# Patient Record
Sex: Female | Born: 1955 | Race: White | Hispanic: No | Marital: Married | Smoking: Former smoker
Health system: Southern US, Community
[De-identification: ages and names within clinical notes are randomized; demographics above are authoritative.]

## PROBLEM LIST (undated history)

## (undated) DIAGNOSIS — E119 Type 2 diabetes mellitus without complications: Secondary | ICD-10-CM

## (undated) DIAGNOSIS — K219 Gastro-esophageal reflux disease without esophagitis: Secondary | ICD-10-CM

## (undated) DIAGNOSIS — R011 Cardiac murmur, unspecified: Secondary | ICD-10-CM

## (undated) DIAGNOSIS — U071 COVID-19: Secondary | ICD-10-CM

## (undated) DIAGNOSIS — I10 Essential (primary) hypertension: Secondary | ICD-10-CM

## (undated) DIAGNOSIS — J45909 Unspecified asthma, uncomplicated: Secondary | ICD-10-CM

## (undated) DIAGNOSIS — J189 Pneumonia, unspecified organism: Secondary | ICD-10-CM

## (undated) DIAGNOSIS — E785 Hyperlipidemia, unspecified: Secondary | ICD-10-CM

## (undated) DIAGNOSIS — B009 Herpesviral infection, unspecified: Secondary | ICD-10-CM

## (undated) DIAGNOSIS — T7840XA Allergy, unspecified, initial encounter: Secondary | ICD-10-CM

## (undated) HISTORY — DX: Hyperlipidemia, unspecified: E78.5

## (undated) HISTORY — DX: Cardiac murmur, unspecified: R01.1

## (undated) HISTORY — DX: Allergy, unspecified, initial encounter: T78.40XA

## (undated) HISTORY — PX: AUGMENTATION MAMMAPLASTY: SUR837

## (undated) HISTORY — DX: Gastro-esophageal reflux disease without esophagitis: K21.9

## (undated) HISTORY — PX: CHOLECYSTECTOMY: SHX55

## (undated) HISTORY — DX: Unspecified asthma, uncomplicated: J45.909

## (undated) HISTORY — PX: BREAST EXCISIONAL BIOPSY: SUR124

## (undated) HISTORY — PX: ABDOMINAL HYSTERECTOMY: SHX81

## (undated) HISTORY — DX: Type 2 diabetes mellitus without complications: E11.9

## (undated) HISTORY — DX: COVID-19: U07.1

## (undated) HISTORY — PX: UPPER GASTROINTESTINAL ENDOSCOPY: SHX188

## (undated) HISTORY — DX: Herpesviral infection, unspecified: B00.9

## (undated) HISTORY — DX: Essential (primary) hypertension: I10

## (undated) HISTORY — PX: COLONOSCOPY: SHX174

## (undated) HISTORY — PX: BREAST SURGERY: SHX581

---

## 2016-08-06 LAB — PULMONARY FUNCTION TEST

## 2017-08-19 ENCOUNTER — Encounter: Payer: Self-pay | Admitting: Family Medicine

## 2017-08-19 ENCOUNTER — Ambulatory Visit (INDEPENDENT_AMBULATORY_CARE_PROVIDER_SITE_OTHER): Payer: BLUE CROSS/BLUE SHIELD | Admitting: Family Medicine

## 2017-08-19 VITALS — BP 130/80 | HR 83 | Temp 98.2°F | Ht 63.0 in | Wt 162.5 lb

## 2017-08-19 DIAGNOSIS — J45909 Unspecified asthma, uncomplicated: Secondary | ICD-10-CM | POA: Insufficient documentation

## 2017-08-19 DIAGNOSIS — J4541 Moderate persistent asthma with (acute) exacerbation: Secondary | ICD-10-CM | POA: Diagnosis not present

## 2017-08-19 LAB — PEAK FLOW METER: PEAK SYSTOLIC VELOCITY: 255 cm/s

## 2017-08-19 MED ORDER — DOXYCYCLINE HYCLATE 100 MG PO TABS: 100.0000 mg | ORAL_TABLET | Freq: Two times a day (BID) | ORAL | 0 refills | Status: DC

## 2017-08-19 NOTE — Progress Notes (Signed)
Subjective:  Patient ID: Alejandra Hess, female    DOB: [birth year]  Age: 62 y.o. MRN: [id number]  CC: Establish Care (blue phlegm on thursday, bad asthma, husband had staph infection from hospital, been on cipro x 3 day & 10 mg of cortizone a day.)   HPI Alejandra Hess presents for evaluation of a 4-day history of a cough productive of bluish spit phlegm.  She has a history of severe asthma that has been exacerbated by this current infection.  She is running no fever or chills.  Her husband is convalescing from a back injury and staph cellulitis.  Patient has no boils but she is concerned about a staph infection in her lungs.  She has no history of staph pneumonia.  She has been diagnosed with the acinic asthma.  She did not tolerate the maintenance drug for this medicine through her pulmonologist back in [place].  She currently is just using albuterol nebs with as needed prednisone tapers.  She quit smoking 10 years ago.  She had a CT of her chest done a year and a half ago.  She recently moved into this area 4 weeks ago.  She does have a past medical history of scarring.  She has ongoing allergy rhinitis with nasal congestion sneezing and postnasal drip.  This is been treated with Zyrtec and Nasonex.  History Alejandra Hess has no past medical history on file.   She has no past surgical history on file.   Her family history is not on file.She reports that she has quit smoking. She has never used smokeless tobacco. Her alcohol and drug histories are not on file.  Outpatient Medications Prior to Visit  Medication Sig Dispense Refill  . albuterol (PROVENTIL HFA) 108 (90 Base) MCG/ACT inhaler Inhale into the lungs.    [name] albuterol (PROVENTIL) (2.5 MG/3ML) 0.083% nebulizer solution VVN TID  7  . Ascorbic Acid (VITAMIN C PO) Take 1 tablet by mouth daily.    [name] azelastine (ASTELIN) 0.1 % nasal spray Place 2 sprays into both nostrils daily.  5  . cetirizine (ZYRTEC) 5 MG tablet Take 1 tablet by  mouth daily.    . Fluocinolone Acetonide 0.01 % OIL Place 4 drops into both ears daily as needed.    [name] MELATONIN PO Take 1 tablet by mouth daily.    . Multiple Vitamin (MULTIVITAMIN) tablet Take 1 tablet by mouth daily.    . Omega-3 Fatty Acids (FISH OIL PO) Take 1 capsule by mouth daily.    [name] omeprazole (PRILOSEC) 40 MG capsule Take 1 capsule by mouth daily as needed.    . predniSONE (DELTASONE) 5 MG tablet Take 5 mg by mouth as needed.    . raNITIdine HCl ([facility] RANITIDINE PO) Take 1 tablet by mouth daily.    [name] triamcinolone (NASACORT ALLERGY 24HR) 55 MCG/ACT AERO nasal inhaler Place 2 sprays into the nose daily.    [name] UNABLE TO FIND Med Name: CBC oil     No facility-administered medications prior to visit.     ROS Review of Systems  Constitutional: Negative for chills, fatigue, fever and unexpected weight change.  HENT: Positive for congestion, postnasal drip, rhinorrhea and sneezing. Negative for sinus pressure, sinus pain and sore throat.   Eyes: Negative for photophobia and visual disturbance.  Respiratory: Positive for cough and wheezing.   Cardiovascular: Negative.   Gastrointestinal: Negative.   Musculoskeletal: Negative for arthralgias and myalgias.  Skin: Negative for color change and pallor.  Neurological: Negative for weakness  and headaches.  Hematological: Does not bruise/bleed easily.  Psychiatric/Behavioral: Negative.     Objective:  BP 130/80   Pulse 83   Temp 98.2 F (36.8 C)   Ht 5\' 3"  (1.6 m)   Wt 162 lb 8 oz (73.7 kg)   SpO2 97%   BMI 28.79 kg/m   Physical Exam  Constitutional: She is oriented to person, place, and time. She appears well-developed and well-nourished. No distress.  HENT:  Head: Normocephalic and atraumatic.  Right Ear: External ear normal.  Left Ear: External ear normal.  Nose: Nose normal.  Mouth/Throat: Oropharynx is clear and moist. No oropharyngeal exudate.  Eyes: Pupils are equal, round, and reactive to light. Conjunctivae and EOM  are normal. Right eye exhibits no discharge. Left eye exhibits no discharge.  Neck: Normal range of motion. Neck supple. No JVD present. No tracheal deviation present. No thyromegaly present.  Cardiovascular: Normal rate and regular rhythm.  Murmur heard.  Systolic murmur is present with a grade of 1/6. Pulmonary/Chest: Effort normal. No respiratory distress. She has no wheezes. She has rhonchi in the right lower field and the left lower field. She has no rales.  Neurological: She is alert and oriented to person, place, and time.  Skin: Skin is warm and dry. Capillary refill takes less than 2 seconds. No rash noted. She is not diaphoretic. No erythema. No pallor.  Psychiatric: She has a normal mood and affect. Her behavior is normal.      Assessment & Plan:   Alejandra Hess was seen today for establish care.  Diagnoses and all orders for this visit:  Moderate persistent asthmatic bronchitis with acute exacerbation -     Peak flow meter -     Cancel: DG Chest 2 View; Future -     Discontinue: doxycycline (VIBRA-TABS) 100 MG tablet; Take 1 tablet (100 mg total) by mouth 2 (two) times daily. -     Ambulatory referral to Pulmonology -     doxycycline (VIBRA-TABS) 100 MG tablet; Take 1 tablet (100 mg total) by mouth 2 (two) times daily.   I am having Alejandra Hess maintain her albuterol, azelastine, cetirizine, multivitamin, Omega-3 Fatty Acids (FISH OIL PO), omeprazole, Fluocinolone Acetonide, albuterol, Ascorbic Acid (VITAMIN C PO), predniSONE, UNABLE TO FIND, triamcinolone, MELATONIN PO, raNITIdine HCl ([facility] RANITIDINE PO), and doxycycline.  Meds ordered this encounter  Medications  . DISCONTD: doxycycline (VIBRA-TABS) 100 MG tablet    Sig: Take 1 tablet (100 mg total) by mouth 2 (two) times daily.    Dispense:  20 tablet    Refill:  0  . doxycycline (VIBRA-TABS) 100 MG tablet    Sig: Take 1 tablet (100 mg total) by mouth 2 (two) times daily.    Dispense:  20 tablet    Refill:  0   She  will start a 6-day prednisone taper along with the prescribed doxycycline.  She will hold her Cipro.  Use her albuterol nebs as needed.  With her history of pulmonary disease I think it is important for her to establish with a pulmonologist.  Follow-up: Return in about 4 days (around 08/23/2017), or if symptoms worsen or fail to improve.  [name], MD

## 2017-12-16 ENCOUNTER — Encounter: Payer: BLUE CROSS/BLUE SHIELD | Admitting: Family Medicine

## 2017-12-18 ENCOUNTER — Encounter: Payer: BLUE CROSS/BLUE SHIELD | Admitting: Family Medicine

## 2018-01-27 ENCOUNTER — Encounter: Payer: Self-pay | Admitting: Family Medicine

## 2018-01-27 ENCOUNTER — Other Ambulatory Visit: Payer: Self-pay

## 2018-01-27 ENCOUNTER — Ambulatory Visit (INDEPENDENT_AMBULATORY_CARE_PROVIDER_SITE_OTHER): Payer: BLUE CROSS/BLUE SHIELD | Admitting: Family Medicine

## 2018-01-27 VITALS — BP 128/80 | HR 70 | Ht 63.0 in | Wt 165.2 lb

## 2018-01-27 DIAGNOSIS — Z0000 Encounter for general adult medical examination without abnormal findings: Secondary | ICD-10-CM | POA: Insufficient documentation

## 2018-01-27 LAB — COMPREHENSIVE METABOLIC PANEL
ALT: 27 U/L (ref 0–35)
AST: 17 U/L (ref 0–37)
Albumin: 4.3 g/dL (ref 3.5–5.2)
Alkaline Phosphatase: 72 U/L (ref 39–117)
BUN: 19 mg/dL (ref 6–23)
CHLORIDE: 103 meq/L (ref 96–112)
CO2: 28 mEq/L (ref 19–32)
CREATININE: 0.75 mg/dL (ref 0.40–1.20)
Calcium: 9.8 mg/dL (ref 8.4–10.5)
GFR: 82.56 mL/min (ref >60.00)
Glucose, Bld: 133 mg/dL — ABNORMAL HIGH (ref 70–99)
Potassium: 4 mEq/L (ref 3.5–5.1)
SODIUM: 139 meq/L (ref 135–145)
Total Bilirubin: 0.5 mg/dL (ref 0.2–1.2)
Total Protein: 7.3 g/dL (ref 6.0–8.3)

## 2018-01-27 LAB — CBC
HCT: 43.5 % (ref 36.0–46.0)
Hemoglobin: 14.4 g/dL (ref 12.0–15.0)
MCHC: 33 g/dL (ref 30.0–36.0)
MCV: 95 fl (ref 78.0–100.0)
PLATELETS: 272 10*3/uL (ref 150.0–400.0)
RBC: 4.58 Mil/uL (ref 3.87–5.11)
RDW: 13.6 % (ref 11.5–15.5)
WBC: 6.1 10*3/uL (ref 4.0–10.5)

## 2018-01-27 LAB — LIPID PANEL
Cholesterol: 211 mg/dL — ABNORMAL HIGH (ref 0–200)
HDL: 51 mg/dL (ref >39.00)
LDL CALC: 140 mg/dL — AB (ref 0–99)
NonHDL: 159.55
Total CHOL/HDL Ratio: 4
Triglycerides: 96 mg/dL (ref 0.0–149.0)
VLDL: 19.2 mg/dL (ref 0.0–40.0)

## 2018-01-27 LAB — URINALYSIS, ROUTINE W REFLEX MICROSCOPIC
Bilirubin Urine: NEGATIVE
Hgb urine dipstick: NEGATIVE
Ketones, ur: NEGATIVE
Leukocytes, UA: NEGATIVE
Nitrite: NEGATIVE
RBC / HPF: NONE SEEN (ref 0–?)
Specific Gravity, Urine: <=1.005 — AB (ref 1.000–1.030)
Total Protein, Urine: NEGATIVE
URINE GLUCOSE: NEGATIVE
Urobilinogen, UA: 0.2 (ref 0.0–1.0)
pH: 6 (ref 5.0–8.0)

## 2018-01-27 MED ORDER — OMEPRAZOLE 40 MG PO CPDR: 40.0000 mg | DELAYED_RELEASE_CAPSULE | Freq: Every day | ORAL | 3 refills | Status: DC | PRN

## 2018-01-27 MED ORDER — AZELASTINE HCL 0.1 % NA SOLN: 2.0000 | Freq: Every day | NASAL | 2 refills | Status: DC

## 2018-01-27 MED ORDER — TRIAMCINOLONE ACETONIDE 55 MCG/ACT NA AERO: 2.0000 | INHALATION_SPRAY | Freq: Every day | NASAL | 2 refills | Status: DC

## 2018-01-27 MED ORDER — FLUOCINOLONE ACETONIDE 0.01 % OT OIL: 4.0000 [drp] | TOPICAL_OIL | Freq: Every day | OTIC | 2 refills | Status: DC | PRN

## 2018-01-27 NOTE — Progress Notes (Addendum)
 Established Patient Office Visit  Subjective:  Patient ID: Alejandra Hess, female    DOB: 03/12/1955  Age: 62 y.o. MRN: [id number]  CC:  Chief Complaint  Patient presents with  . Annual Exam    HPI Alejandra Hess presents for a complete physical exam. She has already received her influenza vaccine.  Patient is here for physical exam.  She is fasting today.  She is retired since age 60.  Normal colonoscopy 2 years ago.  She quit tobacco 10 years ago.  She drinks 2 glasses of red wine daily.  She exercises at least 4 days a week.  She is planning on going on Medicare in June and wants to put things off as much as possible until then.  She had a hysterectomy for dysfunctional uterine bleeding at age 40.  Her sugars have been slightly elevated.  She has no history of gestational diabetes.  History of elevated LDL cholesterol.  CT cardiac scoring performed in December 2018 showed 0 calcium score.  She does have a history of asthma.  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  Her asthma symptoms for the most part have been controlled with nasal steroids and Astelin.  Past Medical History:  Diagnosis Date  . Asthma   . GERD (gastroesophageal reflux disease)   . Heart murmur     Past Surgical History:  Procedure Laterality Date  . ABDOMINAL HYSTERECTOMY    . BREAST SURGERY    . CHOLECYSTECTOMY      Family History  Problem Relation Age of Onset  . Hearing loss Mother   . Hyperlipidemia Mother   . Hypertension Mother   . Cancer Father   . Heart attack Father   . Heart disease Father   . Hyperlipidemia Father   . Hypertension Father   . Asthma Sister   . Hyperlipidemia Sister   . Hypertension Sister   . Cancer Sister   . Hyperlipidemia Sister   . Asthma Sister   . Depression Sister   . Heart attack Sister   . Heart disease Sister   . Hypertension Sister     Social History   Socioeconomic History  . Marital status: Married    Spouse name: Not on file  . Number  of children: Not on file  . Years of education: Not on file  . Highest education level: Not on file  Occupational History  . Not on file  Social Needs  . Financial resource strain: Not on file  . Food insecurity:    Worry: Not on file    Inability: Not on file  . Transportation needs:    Medical: Not on file    Non-medical: Not on file  Tobacco Use  . Smoking status: Former Smoker  . Smokeless tobacco: Never Used  Substance and Sexual Activity  . Alcohol use: Yes    Comment: 2 glasses of red wine daily  . Drug use: Never  . Sexual activity: Not on file  Lifestyle  . Physical activity:    Days per week: Not on file    Minutes per session: Not on file  . Stress: Not on file  Relationships  . Social connections:    Talks on phone: Not on file    Gets together: Not on file    Attends religious service: Not on file    Active member of club or organization: Not on file    Attends meetings of clubs or organizations: Not on file      Relationship status: Not on file  . Intimate partner violence:    Fear of current or ex partner: Not on file    Emotionally abused: Not on file    Physically abused: Not on file    Forced sexual activity: Not on file  Other Topics Concern  . Not on file  Social History Narrative  . Not on file    Outpatient Medications Prior to Visit  Medication Sig Dispense Refill  . albuterol (PROVENTIL HFA) 108 (90 Base) MCG/ACT inhaler Inhale into the lungs.    . albuterol (PROVENTIL) (2.5 MG/3ML) 0.083% nebulizer solution VVN TID  7  . Ascorbic Acid (VITAMIN C PO) Take 1 tablet by mouth daily.    . MELATONIN PO Take 1 tablet by mouth daily.    . Multiple Vitamin (MULTIVITAMIN) tablet Take 1 tablet by mouth daily.    . Omega-3 Fatty Acids (FISH OIL PO) Take 1 capsule by mouth daily.    . predniSONE (DELTASONE) 5 MG tablet Take 5 mg by mouth as needed.    . Probiotic Product (PROBIOTIC PO) Take 1 capsule by mouth daily.    . raNITIdine HCl ([facility] RANITIDINE  PO) Take 1 tablet by mouth daily.    . UNABLE TO FIND Med Name: CBC oil    . azelastine (ASTELIN) 0.1 % nasal spray Place 2 sprays into both nostrils daily. Use in each nostril as directed    . Fluocinolone Acetonide 0.01 % OIL Place 4 drops into both ears daily as needed.    . omeprazole (PRILOSEC) 40 MG capsule Take 1 capsule by mouth daily as needed.    . triamcinolone (NASACORT ALLERGY 24HR) 55 MCG/ACT AERO nasal inhaler Place 2 sprays into the nose daily.    . azelastine (ASTELIN) 0.1 % nasal spray Place 2 sprays into both nostrils daily.  5  . cetirizine (ZYRTEC) 5 MG tablet Take 1 tablet by mouth daily.    . doxycycline (VIBRA-TABS) 100 MG tablet Take 1 tablet (100 mg total) by mouth 2 (two) times daily. 20 tablet 0   No facility-administered medications prior to visit.     Allergies  Allergen Reactions  . Azithromycin Rash  . Penicillins Hives and Rash    ROS Review of Systems  Constitutional: Negative.   Eyes: Negative for photophobia and visual disturbance.  Respiratory: Negative.   Cardiovascular: Negative.   Gastrointestinal: Negative.   Endocrine: Negative for polyphagia and polyuria.  Genitourinary: Negative.   Skin: Negative for pallor.  Allergic/Immunologic: Negative for immunocompromised state.  Neurological: Negative for light-headedness and numbness.  Hematological: Does not bruise/bleed easily.  Psychiatric/Behavioral: Negative.       Objective:    Physical Exam  Constitutional: She is oriented to person, place, and time. She appears well-developed and well-nourished. No distress.  HENT:  Head: Normocephalic and atraumatic.  Right Ear: External ear normal.  Left Ear: External ear normal.  Mouth/Throat: Oropharynx is clear and moist. No oropharyngeal exudate.  Eyes: Pupils are equal, round, and reactive to light. Conjunctivae are normal. Right eye exhibits no discharge. Left eye exhibits no discharge. No scleral icterus.  Neck: Neck supple. No JVD  present. No tracheal deviation present. No thyromegaly present.  Cardiovascular: Normal rate, regular rhythm and normal heart sounds.  Pulses:      Carotid pulses are 2+ on the right side, and 2+ on the left side.      Dorsalis pedis pulses are 2+ on the right side, and 2+ on the left side.         Posterior tibial pulses are 2+ on the right side, and 2+ on the left side.  Pulmonary/Chest: Effort normal and breath sounds normal. No stridor. No respiratory distress. She has no wheezes. She has no rales.  Abdominal: Bowel sounds are normal.  Lymphadenopathy:    She has no cervical adenopathy.  Neurological: She is alert and oriented to person, place, and time.  Skin: Skin is warm and dry. She is not diaphoretic.  Psychiatric: She has a normal mood and affect. Her behavior is normal.    BP 128/80   Pulse 70   Ht 5' 3" (1.6 m)   Wt 165 lb 4 oz (75 kg)   SpO2 96%   BMI 29.27 kg/m  Wt Readings from Last 3 Encounters:  01/27/18 165 lb 4 oz (75 kg)  08/19/17 162 lb 8 oz (73.7 kg)   BP Readings from Last 3 Encounters:  01/27/18 128/80  08/19/17 130/80   There are no preventive care reminders to display for this patient.  There are no preventive care reminders to display for this patient.  No results found for: TSH No results found for: WBC, HGB, HCT, MCV, PLT No results found for: NA, K, CHLORIDE, CO2, GLUCOSE, BUN, CREATININE, BILITOT, ALKPHOS, AST, ALT, PROT, ALBUMIN, CALCIUM, ANIONGAP, EGFR, GFR No results found for: CHOL No results found for: HDL No results found for: LDLCALC No results found for: TRIG No results found for: CHOLHDL No results found for: HGBA1C    Assessment & Plan:   Problem List Items Addressed This Visit      Other   Health care maintenance - Primary   Relevant Orders   CBC   Comprehensive metabolic panel   Lipid panel   Urinalysis, Routine w reflex microscopic      No orders of the defined types were placed in this encounter.  Fasting labs  were drawn today.  Patient was given anticipatory guidance for health maintenance and disease per prevention.  She would like to see a pulmonologist and go for GYN follow-up after she goes on Medicare. Follow-up: Return in about 7 months (around 08/28/2018).    The 10-year ASCVD risk score (Goff DC Jr., et al., 2013) is: 5.4%   Values used to calculate the score:     Age: 62 years     Sex: Female     Is Non-Hispanic African American: No     Diabetic: No     Tobacco smoker: No     Systolic Blood Pressure: 128 mmHg     Is BP treated: No     HDL Cholesterol: 51 mg/dL     Total Cholesterol: 211 mg/dL 

## 2018-01-27 NOTE — Patient Instructions (Signed)

## 2018-03-03 ENCOUNTER — Encounter: Payer: Self-pay | Admitting: Family Medicine

## 2018-03-03 MED ORDER — BUDESONIDE-FORMOTEROL FUMARATE 160-4.5 MCG/ACT IN AERO: 1.0000 | INHALATION_SPRAY | Freq: Every evening | RESPIRATORY_TRACT | 3 refills | Status: DC | PRN

## 2018-05-07 ENCOUNTER — Telehealth: Payer: Self-pay | Admitting: Family Medicine

## 2018-05-07 NOTE — Telephone Encounter (Signed)
I called and left message on patient voicemail in reference to message sent to our office by patient to schedule a follow up appointment with Dr. Ethelene Hal. Due to patient not being available to schedule appointment by phone, I left message to call office and schedule appointment.

## 2018-06-26 ENCOUNTER — Encounter: Payer: Self-pay | Admitting: Family Medicine

## 2018-06-26 DIAGNOSIS — R7309 Other abnormal glucose: Secondary | ICD-10-CM

## 2018-06-26 DIAGNOSIS — E7800 Pure hypercholesterolemia, unspecified: Secondary | ICD-10-CM

## 2018-07-02 ENCOUNTER — Other Ambulatory Visit (INDEPENDENT_AMBULATORY_CARE_PROVIDER_SITE_OTHER): Payer: Medicare Other

## 2018-07-02 DIAGNOSIS — R7309 Other abnormal glucose: Secondary | ICD-10-CM | POA: Diagnosis not present

## 2018-07-02 DIAGNOSIS — E7800 Pure hypercholesterolemia, unspecified: Secondary | ICD-10-CM | POA: Diagnosis not present

## 2018-07-02 LAB — HEMOGLOBIN A1C: Hgb A1c MFr Bld: 7 % — ABNORMAL HIGH (ref 4.6–6.5)

## 2018-07-02 LAB — LDL CHOLESTEROL, DIRECT: Direct LDL: 162 mg/dL

## 2018-07-04 ENCOUNTER — Encounter: Payer: Self-pay | Admitting: Family Medicine

## 2018-07-04 ENCOUNTER — Ambulatory Visit (INDEPENDENT_AMBULATORY_CARE_PROVIDER_SITE_OTHER): Payer: Medicare Other | Admitting: Family Medicine

## 2018-07-04 VITALS — Ht 63.0 in

## 2018-07-04 DIAGNOSIS — Z0000 Encounter for general adult medical examination without abnormal findings: Secondary | ICD-10-CM | POA: Diagnosis not present

## 2018-07-04 DIAGNOSIS — J4520 Mild intermittent asthma, uncomplicated: Secondary | ICD-10-CM | POA: Diagnosis not present

## 2018-07-04 DIAGNOSIS — E119 Type 2 diabetes mellitus without complications: Secondary | ICD-10-CM | POA: Diagnosis not present

## 2018-07-04 DIAGNOSIS — E7800 Pure hypercholesterolemia, unspecified: Secondary | ICD-10-CM | POA: Diagnosis not present

## 2018-07-04 MED ORDER — METFORMIN HCL ER 500 MG PO TB24: 500.0000 mg | ORAL_TABLET | Freq: Every day | ORAL | 1 refills | Status: DC

## 2018-07-04 MED ORDER — ATORVASTATIN CALCIUM 20 MG PO TABS: 20.0000 mg | ORAL_TABLET | Freq: Every day | ORAL | 1 refills | Status: DC

## 2018-07-04 NOTE — Progress Notes (Signed)
Virtual Visit via Video Note  I connected with [name] on 07/04/18 at 10:00 AM EDT by a video enabled telemedicine application and verified that I am speaking with the correct person using two identifiers.  Location: Patient: home Provider:    Established Patient Office Visit  Subjective:  Patient ID: Alejandra Hess, female    DOB: [birth year]  Age: 63 y.o. MRN: [id number]  CC:  Chief Complaint  Patient presents with  . Follow-up    HPI Alejandra Hess presents for follow-up of her elevated blood sugar and LDL cholesterol.  Lab work recently drawn shows hemoglobin A1c of 7 and an LDL cholesterol of 162.  Patient has been exercising daily and consuming a low-fat and low-cholesterol diet.  She is retired and has been sheltering at home due to the pandemic.  History of allergy rhinitis and asthma controlled with Symbicort, cetirizine and Flonase.  She admits to feeling some fatigue and decreased energy despite consuming a healthy diet with her excellent exercise regimen.  TSH drawn in December 2018 was 1.909.  Past Medical History:  Diagnosis Date  . Asthma   . GERD (gastroesophageal reflux disease)   . Heart murmur     Past Surgical History:  Procedure Laterality Date  . ABDOMINAL HYSTERECTOMY    . BREAST SURGERY    . CHOLECYSTECTOMY      Family History  Problem Relation Age of Onset  . Hearing loss Mother   . Hyperlipidemia Mother   . Hypertension Mother   . Cancer Father   . Heart attack Father   . Heart disease Father   . Hyperlipidemia Father   . Hypertension Father   . Asthma Sister   . Hyperlipidemia Sister   . Hypertension Sister   . Cancer Sister   . Hyperlipidemia Sister   . Asthma Sister   . Depression Sister   . Heart attack Sister   . Heart disease Sister   . Hypertension Sister     Social History   Socioeconomic History  . Marital status: Married    Spouse name: Not on file  . Number of children: Not on file  . Years of education:  Not on file  . Highest education level: Not on file  Occupational History  . Not on file  Social Needs  . Financial resource strain: Not on file  . Food insecurity:    Worry: Not on file    Inability: Not on file  . Transportation needs:    Medical: Not on file    Non-medical: Not on file  Tobacco Use  . Smoking status: Former Research scientist (life sciences)  . Smokeless tobacco: Never Used  Substance and Sexual Activity  . Alcohol use: Yes    Comment: 2 glasses of red wine daily  . Drug use: Never  . Sexual activity: Not on file  Lifestyle  . Physical activity:    Days per week: Not on file    Minutes per session: Not on file  . Stress: Not on file  Relationships  . Social connections:    Talks on phone: Not on file    Gets together: Not on file    Attends religious service: Not on file    Active member of club or organization: Not on file    Attends meetings of clubs or organizations: Not on file    Relationship status: Not on file  . Intimate partner violence:    Fear of current or ex partner: Not on file    Emotionally  abused: Not on file    Physically abused: Not on file    Forced sexual activity: Not on file  Other Topics Concern  . Not on file  Social History Narrative  . Not on file    Outpatient Medications Prior to Visit  Medication Sig Dispense Refill  . albuterol (PROVENTIL HFA) 108 (90 Base) MCG/ACT inhaler Inhale into the lungs.    [name] albuterol (PROVENTIL) (2.5 MG/3ML) 0.083% nebulizer solution VVN TID  7  . azelastine (ASTELIN) 0.1 % nasal spray Place 2 sprays into both nostrils daily. Use in each nostril as directed 30 mL 2  . budesonide-formoterol (SYMBICORT) 160-4.5 MCG/ACT inhaler Inhale 1 puff into the lungs at bedtime as needed. 1 Inhaler 3  . Fluocinolone Acetonide 0.01 % OIL Place 4 drops into both ears daily as needed. 20 mL 2  . MELATONIN PO Take 1 tablet by mouth daily.    . Multiple Vitamin (MULTIVITAMIN) tablet Take 1 tablet by mouth daily.    . Omega-3 Fatty  Acids (FISH OIL PO) Take 1 capsule by mouth daily.    [name] omeprazole (PRILOSEC) 40 MG capsule Take 1 capsule (40 mg total) by mouth daily as needed. 90 capsule 3  . predniSONE (DELTASONE) 5 MG tablet Take 5 mg by mouth as needed.    . Probiotic Product (PROBIOTIC PO) Take 1 capsule by mouth daily.    [name] triamcinolone (NASACORT ALLERGY 24HR) 55 MCG/ACT AERO nasal inhaler Place 2 sprays into the nose daily. 16.9 mL 2  . UNABLE TO FIND Med Name: CBC oil    . Ascorbic Acid (VITAMIN C PO) Take 1 tablet by mouth daily.    . raNITIdine HCl ([facility] RANITIDINE PO) Take 1 tablet by mouth daily.     No facility-administered medications prior to visit.     Allergies  Allergen Reactions  . Azithromycin Rash  . Penicillins Hives and Rash    ROS Review of Systems  Constitutional: Positive for fatigue.  Respiratory: Negative.  Negative for shortness of breath and wheezing.   Cardiovascular: Negative.   Gastrointestinal: Negative.   Endocrine: Negative for polyphagia and polyuria.      Objective:    Physical Exam  Constitutional: She is oriented to person, place, and time. She appears well-developed and well-nourished. No distress.  HENT:  Head: Normocephalic and atraumatic.  Right Ear: External ear normal.  Left Ear: External ear normal.  Eyes: Right eye exhibits no discharge. Left eye exhibits no discharge. No scleral icterus.  Pulmonary/Chest: Effort normal.  Neurological: She is alert and oriented to person, place, and time.  Skin: She is not diaphoretic.  Psychiatric: She has a normal mood and affect. Her behavior is normal.    Ht 5\' 3"  (1.6 m)   BMI 29.27 kg/m  Wt Readings from Last 3 Encounters:  01/27/18 165 lb 4 oz (75 kg)  08/19/17 162 lb 8 oz (73.7 kg)     Health Maintenance Due  Topic Date Due  . FOOT EXAM  07/28/1963  . OPHTHALMOLOGY EXAM  07/28/1963    There are no preventive care reminders to display for this patient.  No results found for: TSH Lab Results   Component Value Date   WBC 6.1 01/27/2018   HGB 14.4 01/27/2018   HCT 43.5 01/27/2018   MCV 95.0 01/27/2018   PLT 272.0 01/27/2018   Lab Results  Component Value Date   NA 139 01/27/2018   K 4.0 01/27/2018   CO2 28 01/27/2018   GLUCOSE 133 (  H) 01/27/2018   BUN 19 01/27/2018   CREATININE 0.75 01/27/2018   BILITOT 0.5 01/27/2018   ALKPHOS 72 01/27/2018   AST 17 01/27/2018   ALT 27 01/27/2018   PROT 7.3 01/27/2018   ALBUMIN 4.3 01/27/2018   CALCIUM 9.8 01/27/2018   GFR 82.56 01/27/2018   Lab Results  Component Value Date   CHOL 211 (H) 01/27/2018   Lab Results  Component Value Date   HDL 51.00 01/27/2018   Lab Results  Component Value Date   LDLCALC 140 (H) 01/27/2018   Lab Results  Component Value Date   TRIG 96.0 01/27/2018   Lab Results  Component Value Date   CHOLHDL 4 01/27/2018   Lab Results  Component Value Date   HGBA1C 7.0 (H) 07/02/2018      Assessment & Plan:   Problem List Items Addressed This Visit      Endocrine   Controlled type 2 diabetes mellitus without complication, without long-term current use of insulin (HCC)   Relevant Medications   metFORMIN (GLUCOPHAGE-XR) 500 MG 24 hr tablet   atorvastatin (LIPITOR) 20 MG tablet     Other   Elevated LDL cholesterol level - Primary   Relevant Medications   atorvastatin (LIPITOR) 20 MG tablet      Meds ordered this encounter  Medications  . metFORMIN (GLUCOPHAGE-XR) 500 MG 24 hr tablet    Sig: Take 1 tablet (500 mg total) by mouth at bedtime.    Dispense:  90 tablet    Refill:  1  . atorvastatin (LIPITOR) 20 MG tablet    Sig: Take 1 tablet (20 mg total) by mouth daily.    Dispense:  90 tablet    Refill:  1   The 10-year ASCVD risk score Mikey Bussing DC Jr., et al., 2013) is: 10.1%   Values used to calculate the score:     Age: 35 years     Sex: Female     Is Non-Hispanic African American: No     Diabetic: Yes     Tobacco smoker: No     Systolic Blood Pressure: 542 mmHg     Is BP  treated: No     HDL Cholesterol: 51 mg/dL     Total Cholesterol: 211 mg/dL Follow-up: Return in about 3 months (around 10/04/2018).    [name], MD   I discussed the limitations of evaluation and management by telemedicine and the availability of in person appointments. The patient expressed understanding and agreed to proceed.  History of Present Illness:    Observations/Objective:   Assessment and Plan:   Follow Up Instructions:    I discussed the assessment and treatment plan with the patient. The patient was provided an opportunity to ask questions and all were answered. The patient agreed with the plan and demonstrated an understanding of the instructions.   The patient was advised to call back or seek an in-person evaluation if the symptoms worsen or if the condition fails to improve as anticipated.  I provided 20 minutes of non-face-to-face time during this encounter.   Encourage patient to continue her healthy lifestyle.  Hopefully diabetes treatment will increase her energy levels and decrease her sense of fatigue.  Will consider rechecking her thyroid status and other things this is not the case.  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  She used to get diabetic teaching.

## 2018-07-22 ENCOUNTER — Encounter: Payer: Self-pay | Admitting: Family Medicine

## 2018-07-24 ENCOUNTER — Encounter: Payer: Self-pay | Admitting: Family Medicine

## 2018-07-24 NOTE — Addendum Note (Signed)
Addended by: Jon Billings on: 07/24/2018 08:21 AM   Modules accepted: Orders

## 2018-08-02 ENCOUNTER — Encounter: Payer: Self-pay | Admitting: Family Medicine

## 2018-08-04 ENCOUNTER — Telehealth: Payer: Self-pay

## 2018-08-04 NOTE — Telephone Encounter (Signed)
Patient has a referral in for diabetic teaching, she is requesting an update on the referral.

## 2018-08-04 NOTE — Telephone Encounter (Signed)
See message below °

## 2018-08-16 ENCOUNTER — Encounter: Payer: Self-pay | Admitting: Family Medicine

## 2018-08-16 DIAGNOSIS — Z1231 Encounter for screening mammogram for malignant neoplasm of breast: Secondary | ICD-10-CM

## 2018-08-18 ENCOUNTER — Encounter: Payer: Self-pay | Admitting: Family Medicine

## 2018-08-18 NOTE — Addendum Note (Signed)
Addended by: Jon Billings on: 08/18/2018 08:11 AM   Modules accepted: Orders

## 2018-08-28 ENCOUNTER — Ambulatory Visit: Payer: Medicare Other | Admitting: Dietician

## 2018-10-01 ENCOUNTER — Encounter: Payer: Self-pay | Admitting: Family Medicine

## 2018-10-07 NOTE — Telephone Encounter (Signed)
I don't have any records

## 2018-10-07 NOTE — Telephone Encounter (Signed)
Patrice, have you received any records from Dr. Donna Christen office on pt?

## 2018-10-07 NOTE — Telephone Encounter (Signed)
Pt has a consult scheduled with MW tomorrow. mychart message sent by pt wanting to make sure records were received from Dt Siddiqui's office. Magda Paganini, please advise if you did receive records from this MD. Thanks!

## 2018-10-08 ENCOUNTER — Ambulatory Visit (INDEPENDENT_AMBULATORY_CARE_PROVIDER_SITE_OTHER): Payer: Medicare Other | Admitting: Internal Medicine

## 2018-10-08 ENCOUNTER — Other Ambulatory Visit: Payer: Self-pay

## 2018-10-08 ENCOUNTER — Encounter: Payer: Self-pay | Admitting: Internal Medicine

## 2018-10-08 DIAGNOSIS — J4530 Mild persistent asthma, uncomplicated: Secondary | ICD-10-CM | POA: Diagnosis not present

## 2018-10-08 NOTE — Patient Instructions (Addendum)
Plan A = Automatic = Dulera 200 (symbicort 160 )  up to 1- 2 puffs every 12 hours and taper after 7 days   Plan B = Backup Only use your albuterol inhaler as a rescue medication to be used if you can't catch your breath by resting or doing a relaxed purse lip breathing pattern.  - The less you use it, the better it will work when you need it. - Ok to use the inhaler up to 2 puffs  every 4 hours if you must but call for appointment if use goes up over your usual need - Don't leave home without it !!  (think of it like the spare tire for your car)   Plan C = Crisis - only use your albuterol nebulizer if you first try Plan B and it fails to help > ok to use the nebulizer up to every 4 hours but if start needing it regularly call for immediate appointment   Plan D = Deltasone (prednisone) Take the prednisone taper as per your [facility] pulmonologist   Plan E = ER - go to ER or call 911 if all else fails     Please schedule a follow up visit in 12 months but call sooner if needed

## 2018-10-08 NOTE — Progress Notes (Signed)
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  [place]  referred to pulmonary clinic 10/08/2018 by Alejandra   Ethelene Hess for AB eval.   Spirometry 08/06/16  FEV1  1.65/FVC  2.37 = Ratio  0.70  ? On what meds ?    History of Present Illness  10/08/2018  Pulmonary/ 1st office eval/Alejandra Hess  Chief Complaint  Patient presents with   Pulmonary Consult    Referred by Alejandra Alejandra Hess.   Dyspnea:  Plays golf, does eliptical daily x 45 min and sats are staying in mid 90s  Cough: none  Sleep: does fine flat SABA use: never uses hfa / last neb feb 2020 and prednisone  No obvious day to day or daytime variability or assoc excess/ purulent sputum or mucus plugs or hemoptysis or cp or chest tightness, subjective wheeze or overt sinus or hb symptoms.   Sleeping now  without nocturnal  or early am exacerbation  of respiratory  c/o's or need for noct saba. Also denies any obvious fluctuation of symptoms with weather or environmental changes or other aggravating or alleviating factors except as outlined above   No unusual exposure hx or h/o childhood pna/ asthma or knowledge of premature birth.  Current Allergies, Complete Past Medical History, Past Surgical History, Family History, and Social History were reviewed in [employer] record.  ROS  The following are not active complaints unless bolded Hoarseness, sore throat, dysphagia, dental problems,  itching, sneezing,  nasal congestion or discharge of excess mucus or purulent secretions, ear ache,   fever, chills, sweats, unintended wt loss or wt gain, classically pleuritic or exertional cp,  orthopnea pnd or arm/hand swelling  or leg swelling, presyncope, palpitations, abdominal pain, anorexia, nausea, vomiting, diarrhea  or change in bowel habits or change in bladder habits, change in stools or change in urine, dysuria, hematuria,  rash, arthralgias, visual complaints, headache, numbness, weakness or ataxia or problems with walking or coordination,  change in mood or  memory.          Past Medical History:  Diagnosis Date   Asthma    GERD (gastroesophageal reflux disease)    Heart murmur     Outpatient Medications Prior to Visit  Medication Sig Dispense Refill   albuterol (PROVENTIL HFA) 108 (90 Base) MCG/ACT inhaler Inhale into the lungs.     albuterol (PROVENTIL) (2.5 MG/3ML) 0.083% nebulizer solution VVN TID  7   azelastine (ASTELIN) 0.1 % nasal spray Place 2 sprays into both nostrils daily. Use in each nostril as directed 30 mL 2   budesonide-formoterol (SYMBICORT) 160-4.5 MCG/ACT inhaler Inhale 1 puff into the lungs at bedtime as needed. 1 Inhaler 3   cetirizine (ZYRTEC) 10 MG tablet Take 10 mg by mouth daily.     Fluocinolone Acetonide 0.01 % OIL Place 4 drops into both ears  daily as needed. 20 mL 2   fluticasone (FLONASE) 50 MCG/ACT nasal spray Place 2 sprays into both nostrils daily.     magnesium oxide (MAG-OX) 400 MG tablet Take 400 mg by mouth daily.     MELATONIN PO Take 1 tablet by mouth daily.     metFORMIN (GLUCOPHAGE-XR) 500 MG 24 hr tablet Take 1 tablet (500 mg total) by mouth at bedtime. 90 tablet 1   Multiple Vitamin (MULTIVITAMIN) tablet Take 1 tablet by mouth daily.     omeprazole (PRILOSEC) 40 MG capsule Take 1 capsule (40 mg total) by mouth daily as needed. 90 capsule 3   predniSONE (DELTASONE) 5 MG tablet Take 5 mg by mouth as needed.      Probiotic Product (PROBIOTIC PO) Take 1 capsule by mouth daily.     atorvastatin (LIPITOR) 20 MG tablet Take 1 tablet (20 mg total) by mouth daily. 90 tablet 1   Omega-3 Fatty Acids (FISH OIL PO) Take 1 capsule by mouth daily.     UNABLE TO FIND Med Name: CBC oil     triamcinolone (NASACORT ALLERGY 24HR) 55 MCG/ACT AERO nasal inhaler Place 2 sprays into the nose daily. 16.9 mL 2      Objective:     BP 130/86 (BP Location: Left Arm, Cuff Size: Normal)    Pulse 74    Ht 5' 4.75" (1.645 m)    Wt 162 lb 6.4 oz (73.7 kg)    SpO2 96%    BMI 27.23 kg/m   SpO2: 96 %  RA  amb wf nad   HEENT: nl dentition, turbinates bilaterally, and oropharynx. Nl external ear canals without cough reflex   NECK :  without JVD/Nodes/TM/ nl carotid upstrokes bilaterally   LUNGS: no acc muscle use,  Nl contour chest which is clear to A and P bilaterally without cough on insp or exp maneuvers   CV:  RRR  no s3 or murmur or increase in P2, and no edema   ABD:  soft and nontender with nl inspiratory excursion in the supine position. No bruits or organomegaly appreciated, bowel sounds nl  MS:  Nl gait/ ext warm without deformities, calf tenderness, cyanosis or clubbing No obvious joint restrictions   SKIN: warm and dry without lesions    NEURO:  alert, approp, nl sensorium with  no motor or cerebellar deficits apparent.     I personally reviewed images and agree with radiology impression as follows:   Chest CT s contrast  08/06/16 nl  CT cardiac lung  views  02/22/27 nl     Assessment   Asthmatic bronchitis Quit smoking 2010  - Spirometry 08/06/16  FEV1  1.65/FVC  2.37 = Ratio  0.70  ? On what meds ?  - 10/08/2018  After extensive coaching inhaler device,  effectiveness =    90% so ok to use symb 160 or dulera 200 up to 2 q 12 h   She has elements suggestive of both copd and asthma typical of ACOS for which there are no standard guidelines but her condition is relatively mild and had done well  most the year on symb 160 one daily so rec either symb 160 up to 2 bid or dulera 200 Based on two studies from [place]  378; 20 p 1865 (2018) and 380 : p2020-30 (2019) in pts with mild asthma it is reasonable to use symbicort or dulera  "prn" flare in this setting but I emphasized this was only shown with symbicort (and  by extrapolation probably applies to duelra if there is a formular restriction)  and takes advantage of the rapid onset of action but is not the same as "rescue therapy" but can be stopped once the acute symptoms have resolved and the need for rescue has been minimized (< 2 x weekly)     Advised:  formulary restrictions will be an ongoing challenge for the forseable future and I would be happy to pick an alternative if the pt will first  provide me a list of them -  pt  will need to return here for training for any new device that is required eg dpi vs hfa vs respimat.    In the meantime we can always provide samples so that the patient never runs out of any needed respiratory medications.    F/u can be q 12 m, sooner prn     Total time devoted to counseling  > 50 % of initial 60 min office visit:  reviewed case with pt/  performed device teaching  using a teach back technique which also  extended face to face time for this visit (see above)  discussion of options/alternatives/ personally creating written customized instructions  in presence of pt  then going over those specific  Instructions directly with the pt including how to use all of the meds but in particular covering each new medication in detail and the difference between the maintenance= "automatic" meds and the prns using an action plan format for the latter (If this problem/symptom => do that organization reading Left to right).  Please see AVS from this visit for a full list of these instructions which I personally wrote for this pt and  are unique to this visit.      Christinia Gully, MD 10/08/2018

## 2018-10-08 NOTE — Assessment & Plan Note (Signed)
Quit smoking 2010  - Spirometry 08/06/16  FEV1  1.65/FVC  2.37 = Ratio  0.70  ? On what meds ?  - 10/08/2018  After extensive coaching inhaler device,  effectiveness =    90% so ok to use symb 160 or dulera 200 up to 2 q 12 h   She has elements suggestive of both copd and asthma typical of ACOS for which there are no standard guidelines but her condition is relatively mild and had done well most the year on symb 160 one daily so rec either symb 160 up to 2 bid or dulera 200 Based on two studies from [place]  378; 20 p 1865 (2018) and 380 : p2020-30 (2019) in pts with mild asthma it is reasonable to use symbicort or dulera  "prn" flare in this setting but I emphasized this was only shown with symbicort (and by extrapolation probably applies to duelra if there is a formular restriction)  and takes advantage of the rapid onset of action but is not the same as "rescue therapy" but can be stopped once the acute symptoms have resolved and the need for rescue has been minimized (< 2 x weekly)     Advised:  formulary restrictions will be an ongoing challenge for the forseable future and I would be happy to pick an alternative if the pt will first  provide me a list of them -  pt  will need to return here for training for any new device that is required eg dpi vs hfa vs respimat.    In the meantime we can always provide samples so that the patient never runs out of any needed respiratory medications.    F/u can be q 12 m, sooner prn     Total time devoted to counseling  > 50 % of initial 60 min office visit:  reviewed case with pt/  performed device teaching  using a teach back technique which also  extended face to face time for this visit (see above)  discussion of options/alternatives/ personally creating written customized instructions  in presence of pt  then going over those specific  Instructions directly with the pt including how to use all of the meds but in particular covering each new medication in  detail and the difference between the maintenance= "automatic" meds and the prns using an action plan format for the latter (If this problem/symptom => do that organization reading Left to right).  Please see AVS from this visit for a full list of these instructions which I personally wrote for this pt and  are unique to this visit.

## 2018-10-10 ENCOUNTER — Encounter: Payer: Self-pay | Admitting: Family Medicine

## 2018-10-10 ENCOUNTER — Other Ambulatory Visit: Payer: Self-pay

## 2018-10-10 DIAGNOSIS — K219 Gastro-esophageal reflux disease without esophagitis: Secondary | ICD-10-CM

## 2018-10-10 DIAGNOSIS — Z83511 Family history of glaucoma: Secondary | ICD-10-CM

## 2018-10-10 DIAGNOSIS — M79674 Pain in right toe(s): Secondary | ICD-10-CM

## 2018-10-13 ENCOUNTER — Encounter: Payer: Self-pay | Admitting: Gastroenterology

## 2018-10-21 ENCOUNTER — Ambulatory Visit (INDEPENDENT_AMBULATORY_CARE_PROVIDER_SITE_OTHER): Payer: Medicare Other | Admitting: Family Medicine

## 2018-10-21 ENCOUNTER — Encounter: Payer: Self-pay | Admitting: Family Medicine

## 2018-10-21 ENCOUNTER — Other Ambulatory Visit: Payer: Self-pay

## 2018-10-21 VITALS — BP 120/80 | HR 66 | Ht 64.75 in | Wt 162.0 lb

## 2018-10-21 DIAGNOSIS — E119 Type 2 diabetes mellitus without complications: Secondary | ICD-10-CM | POA: Diagnosis not present

## 2018-10-21 DIAGNOSIS — Z0000 Encounter for general adult medical examination without abnormal findings: Secondary | ICD-10-CM

## 2018-10-21 DIAGNOSIS — Z23 Encounter for immunization: Secondary | ICD-10-CM | POA: Diagnosis not present

## 2018-10-21 DIAGNOSIS — E7800 Pure hypercholesterolemia, unspecified: Secondary | ICD-10-CM

## 2018-10-21 NOTE — Patient Instructions (Signed)
Health Maintenance After Age 65 After age 39, you are at a higher risk for certain long-term diseases and infections as well as injuries from falls. Falls are a major cause of broken bones and head injuries in people who are older than age 65. Getting regular preventive care can help to keep you healthy and well. Preventive care includes getting regular testing and making lifestyle changes as recommended by your health care provider. Talk with your health care provider about:  Which screenings and tests you should have. A screening is a test that checks for a disease when you have no symptoms.  A diet and exercise plan that is right for you. What should I know about screenings and tests to prevent falls? Screening and testing are the best ways to find a health problem early. Early diagnosis and treatment give you the best chance of managing medical conditions that are common after age 65. Certain conditions and lifestyle choices may make you more likely to have a fall. Your health care provider may recommend:  Regular vision checks. Poor vision and conditions such as cataracts can make you more likely to have a fall. If you wear glasses, make sure to get your prescription updated if your vision changes.  Medicine review. Work with your health care provider to regularly review all of the medicines you are taking, including over-the-counter medicines. Ask your health care provider about any side effects that may make you more likely to have a fall. Tell your health care provider if any medicines that you take make you feel dizzy or sleepy.  Osteoporosis screening. Osteoporosis is a condition that causes the bones to get weaker. This can make the bones weak and cause them to break more easily.  Blood pressure screening. Blood pressure changes and medicines to control blood pressure can make you feel dizzy.  Strength and balance checks. Your health care provider may recommend certain tests to check your  strength and balance while standing, walking, or changing positions.  Foot health exam. Foot pain and numbness, as well as not wearing proper footwear, can make you more likely to have a fall.  Depression screening. You may be more likely to have a fall if you have a fear of falling, feel emotionally low, or feel unable to do activities that you used to do.  Alcohol use screening. Using too much alcohol can affect your balance and may make you more likely to have a fall. What actions can I take to lower my risk of falls? General instructions  Talk with your health care provider about your risks for falling. Tell your health care provider if: ? You fall. Be sure to tell your health care provider about all falls, even ones that seem minor. ? You feel dizzy, sleepy, or off-balance.  Take over-the-counter and prescription medicines only as told by your health care provider. These include any supplements.  Eat a healthy diet and maintain a healthy weight. A healthy diet includes low-fat dairy products, low-fat (lean) meats, and fiber from whole grains, beans, and lots of fruits and vegetables. Home safety  Remove any tripping hazards, such as rugs, cords, and clutter.  Install safety equipment such as grab bars in bathrooms and safety rails on stairs.  Keep rooms and walkways well-lit. Activity   Follow a regular exercise program to stay fit. This will help you maintain your balance. Ask your health care provider what types of exercise are appropriate for you.  If you need a cane or  walker, use it as recommended by your health care provider.  Wear supportive shoes that have nonskid soles. Lifestyle  Do not drink alcohol if your health care provider tells you not to drink.  If you drink alcohol, limit how much you have: ? 0-1 drink a day for women. ? 0-2 drinks a day for men.  Be aware of how much alcohol is in your drink. In the [place] one drink equals one typical bottle of beer (12  oz), one-half glass of wine (5 oz), or one shot of hard liquor (1 oz).  Do not use any products that contain nicotine or tobacco, such as cigarettes and e-cigarettes. If you need help quitting, ask your health care provider. Summary  Having a healthy lifestyle and getting preventive care can help to protect your health and wellness after age 1.  Screening and testing are the best way to find a health problem early and help you avoid having a fall. Early diagnosis and treatment give you the best chance for managing medical conditions that are more common for people who are older than age 65.  Falls are a major cause of broken bones and head injuries in people who are older than age 65. Take precautions to prevent a fall at home.  Work with your health care provider to learn what changes you can make to improve your health and wellness and to prevent falls. This information is not intended to replace advice given to you by your health care provider. Make sure you discuss any questions you have with your health care provider. Document Released: 12/26/2016 Document Revised: 06/05/2018 Document Reviewed: 12/26/2016 Elsevier Patient Education  2020 [place] 65 Years and Older, Female Preventive care refers to lifestyle choices and visits with your health care provider that can promote health and wellness. This includes:  A yearly physical exam. This is also called an annual well check.  Regular dental and eye exams.  Immunizations.  Screening for certain conditions.  Healthy lifestyle choices, such as diet and exercise. What can I expect for my preventive care visit? Physical exam Your health care provider will check:  Height and weight. These may be used to calculate body mass index (BMI), which is a measurement that tells if you are at a healthy weight.  Heart rate and blood pressure.  Your skin for abnormal spots. Counseling Your health care provider may  ask you questions about:  Alcohol, tobacco, and drug use.  Emotional well-being.  Home and relationship well-being.  Sexual activity.  Eating habits.  History of falls.  Memory and ability to understand (cognition).  Work and work Statistician.  Pregnancy and menstrual history. What immunizations do I need?  Influenza (flu) vaccine  This is recommended every year. Tetanus, diphtheria, and pertussis (Tdap) vaccine  You may need a Td booster every 10 years. Varicella (chickenpox) vaccine  You may need this vaccine if you have not already been vaccinated. Zoster (shingles) vaccine  You may need this after age 50. Pneumococcal conjugate (PCV13) vaccine  One dose is recommended after age 48. Pneumococcal polysaccharide (PPSV23) vaccine  One dose is recommended after age 1. Measles, mumps, and rubella (MMR) vaccine  You may need at least one dose of MMR if you were born in 1957 or later. You may also need a second dose. Meningococcal conjugate (MenACWY) vaccine  You may need this if you have certain conditions. Hepatitis A vaccine  You may need this if you have certain conditions or  if you travel or work in places where you may be exposed to hepatitis A. Hepatitis B vaccine  You may need this if you have certain conditions or if you travel or work in places where you may be exposed to hepatitis B. Haemophilus influenzae type b (Hib) vaccine  You may need this if you have certain conditions. You may receive vaccines as individual doses or as more than one vaccine together in one shot (combination vaccines). Talk with your health care provider about the risks and benefits of combination vaccines. What tests do I need? Blood tests  Lipid and cholesterol levels. These may be checked every 5 years, or more frequently depending on your overall health.  Hepatitis C test.  Hepatitis B test. Screening  Lung cancer screening. You may have this screening every year  starting at age 69 if you have a 30-pack-year history of smoking and currently smoke or have quit within the past 15 years.  Colorectal cancer screening. All adults should have this screening starting at age 66 and continuing until age 10. Your health care provider may recommend screening at age 67 if you are at increased risk. You will have tests every 1-10 years, depending on your results and the type of screening test.  Diabetes screening. This is done by checking your blood sugar (glucose) after you have not eaten for a while (fasting). You may have this done every 1-3 years.  Mammogram. This may be done every 1-2 years. Talk with your health care provider about how often you should have regular mammograms.  BRCA-related cancer screening. This may be done if you have a family history of breast, ovarian, tubal, or peritoneal cancers. Other tests  Sexually transmitted disease (STD) testing.  Bone density scan. This is done to screen for osteoporosis. You may have this done starting at age 20. Follow these instructions at home: Eating and drinking  Eat a diet that includes fresh fruits and vegetables, whole grains, lean protein, and low-fat dairy products. Limit your intake of foods with high amounts of sugar, saturated fats, and salt.  Take vitamin and mineral supplements as recommended by your health care provider.  Do not drink alcohol if your health care provider tells you not to drink.  If you drink alcohol: ? Limit how much you have to 0-1 drink a day. ? Be aware of how much alcohol is in your drink. In the [place] one drink equals one 12 oz bottle of beer (355 mL), one 5 oz glass of wine (148 mL), or one 1 oz glass of hard liquor (44 mL). Lifestyle  Take daily care of your teeth and gums.  Stay active. Exercise for at least 30 minutes on 5 or more days each week.  Do not use any products that contain nicotine or tobacco, such as cigarettes, e-cigarettes, and chewing tobacco. If  you need help quitting, ask your health care provider.  If you are sexually active, practice safe sex. Use a condom or other form of protection in order to prevent STIs (sexually transmitted infections).  Talk with your health care provider about taking a low-dose aspirin or statin. What's next?  Go to your health care provider once a year for a well check visit.  Ask your health care provider how often you should have your eyes and teeth checked.  Stay up to date on all vaccines. This information is not intended to replace advice given to you by your health care provider. Make sure you discuss any  questions you have with your health care provider. Document Released: 03/11/2015 Document Revised: 02/06/2018 Document Reviewed: 02/06/2018 Elsevier Patient Education  2020 [employer].

## 2018-10-21 NOTE — Progress Notes (Addendum)
Established Patient Office Visit  Subjective:  Patient ID: Alejandra Hess, female    DOB: [birth year]  Age: 63 y.o. MRN: [id number]  CC:  Chief Complaint  Patient presents with  . Annual Exam    HPI Alejandra Hess presents for a physical exam and follow-up of her diabetes and elevated cholesterol.  She is nonfasting today.  She has been taking the Glucophage without issue.  She is assumed a diabetic diet and has been exercising most every day for at least 30 minutes.  She was unable to tolerate the Lipitor because she felt as though led to conjunctivitis.  She does not smoke.  She drinks 2 glasses of red wine daily.  Unable to go for dental care this year.  Needs an eye check.  Is scheduled to see her GYN doctor next week.  Also scheduled for mammogram next week.  Past Medical History:  Diagnosis Date  . Asthma   . DM (diabetes mellitus) ([place])   . GERD (gastroesophageal reflux disease)   . Heart murmur   . HLD (hyperlipidemia)     Past Surgical History:  Procedure Laterality Date  . ABDOMINAL HYSTERECTOMY    . AUGMENTATION MAMMAPLASTY Bilateral    @ 43 lift with implants  . BREAST EXCISIONAL BIOPSY Right    @ 53  . BREAST EXCISIONAL BIOPSY Left    @43 ?  . CHOLECYSTECTOMY     Family History  Problem Relation Age of Onset  . Hearing loss Mother   . Hyperlipidemia Mother   . Hypertension Mother   . Diabetes Mother   . Heart attack Father   . Heart disease Father   . Hyperlipidemia Father   . Hypertension Father   . Melanoma Father   . Asthma Sister   . Hyperlipidemia Sister   . Hypertension Sister   . Hyperlipidemia Sister   . Breast cancer Sister 12  . Asthma Sister   . Depression Sister   . Heart attack Sister   . Heart disease Sister   . Hypertension Sister   . Diabetes Sister   . Breast cancer Maternal Aunt        over 5  . Breast cancer Paternal Aunt        over 39  . Breast cancer Paternal Grandmother        over 79   . Breast cancer Maternal Aunt         over 59   . Colon cancer Neg Hx   . Esophageal cancer Neg Hx   . Inflammatory bowel disease Neg Hx   . Liver disease Neg Hx   . Pancreatic cancer Neg Hx   . Rectal cancer Neg Hx   . Stomach cancer Neg Hx     Social History   Socioeconomic History  . Marital status: Married    Spouse name: Not on file  . Number of children: 2  . Years of education: Not on file  . Highest education level: Not on file  Occupational History  . Occupation: Therapist, sports  Social Needs  . Financial resource strain: Not on file  . Food insecurity    Worry: Not on file    Inability: Not on file  . Transportation needs    Medical: Not on file    Non-medical: Not on file  Tobacco Use  . Smoking status: Former Smoker    Types: Cigarettes    Quit date: 2013    Years since quitting: 7.7  . Smokeless tobacco: Never Used  Substance and Sexual Activity  . Alcohol use: Yes    Comment: 2 glasses of red wine daily  . Drug use: Never  . Sexual activity: Yes    Partners: Male    Comment: 1st intercourse- 76, partners- 5-, MARRIED- 89  Lifestyle  . Physical activity    Days per week: Not on file    Minutes per session: Not on file  . Stress: Not on file  Relationships  . Social Herbalist on phone: Not on file    Gets together: Not on file    Attends religious service: Not on file    Active member of club or organization: Not on file    Attends meetings of clubs or organizations: Not on file    Relationship status: Not on file  . Intimate partner violence    Fear of current or ex partner: Not on file    Emotionally abused: Not on file    Physically abused: Not on file    Forced sexual activity: Not on file  Other Topics Concern  . Not on file  Social History Narrative  . Not on file    Outpatient Medications Prior to Visit  Medication Sig Dispense Refill  . albuterol (PROVENTIL HFA) 108 (90 Base) MCG/ACT inhaler Inhale into the lungs.    [name] albuterol (PROVENTIL) (2.5 MG/3ML) 0.083%  nebulizer solution VVN TID  7  . azelastine (ASTELIN) 0.1 % nasal spray Place 2 sprays into both nostrils daily. Use in each nostril as directed 30 mL 2  . budesonide-formoterol (SYMBICORT) 160-4.5 MCG/ACT inhaler Inhale 1 puff into the lungs at bedtime as needed. 1 Inhaler 3  . cetirizine (ZYRTEC) 10 MG tablet Take 10 mg by mouth daily.    . Fluocinolone Acetonide 0.01 % OIL Place 4 drops into both ears daily as needed. 20 mL 2  . fluticasone (FLONASE) 50 MCG/ACT nasal spray Place 2 sprays into both nostrils daily.    . magnesium oxide (MAG-OX) 400 MG tablet Take 400 mg by mouth 2 (two) times daily.     [name] MELATONIN PO Take 5 mg by mouth 2 (two) times daily.     . Multiple Vitamin (MULTIVITAMIN) tablet Take 1 tablet by mouth daily.    . predniSONE (DELTASONE) 5 MG tablet Take 5 mg by mouth as needed.    . Probiotic Product (PROBIOTIC PO) Take 1 capsule by mouth 2 (two) times daily.     . metFORMIN (GLUCOPHAGE-XR) 500 MG 24 hr tablet Take 1 tablet (500 mg total) by mouth at bedtime. 90 tablet 1  . omeprazole (PRILOSEC) 40 MG capsule Take 1 capsule (40 mg total) by mouth daily as needed. 90 capsule 3  . atorvastatin (LIPITOR) 20 MG tablet Take 1 tablet (20 mg total) by mouth daily. 90 tablet 1  . Omega-3 Fatty Acids (FISH OIL PO) Take 1 capsule by mouth daily.    [name] UNABLE TO FIND Med Name: CBC oil     No facility-administered medications prior to visit.     Allergies  Allergen Reactions  . Azithromycin Rash  . Penicillins Hives and Rash    ROS Review of Systems  Constitutional: Negative for diaphoresis, fatigue, fever and unexpected weight change.  HENT: Negative.   Eyes: Negative for photophobia and visual disturbance.  Respiratory: Negative.   Cardiovascular: Negative.   Gastrointestinal: Negative.   Endocrine: Negative for polyphagia and polyuria.  Genitourinary: Negative.   Musculoskeletal: Negative for gait problem and joint swelling.  Skin:  Negative for pallor and rash.   Allergic/Immunologic: Negative for immunocompromised state.  Neurological: Negative for speech difficulty and light-headedness.  Hematological: Does not bruise/bleed easily.  Psychiatric/Behavioral: Negative.       Objective:    Physical Exam  Constitutional: She is oriented to person, place, and time. She appears well-developed and well-nourished. No distress.  HENT:  Head: Normocephalic and atraumatic.  Right Ear: External ear normal.  Left Ear: External ear normal.  Mouth/Throat: Oropharynx is clear and moist. No oropharyngeal exudate.  Eyes: Pupils are equal, round, and reactive to light. Conjunctivae are normal. Right eye exhibits no discharge. Left eye exhibits no discharge. No scleral icterus.  Neck: Neck supple. No JVD present. No tracheal deviation present. No thyromegaly present.  Cardiovascular: Normal rate, regular rhythm and normal heart sounds.  Pulses:      Dorsalis pedis pulses are 2+ on the right side and 2+ on the left side.       Posterior tibial pulses are 1+ on the right side and 1+ on the left side.  Pulmonary/Chest: Effort normal and breath sounds normal. No stridor. No respiratory distress. She has no wheezes. She has no rales.  Abdominal: Bowel sounds are normal.  Musculoskeletal:        General: No edema.  Lymphadenopathy:    She has no cervical adenopathy.  Neurological: She is alert and oriented to person, place, and time.  Skin: Skin is warm and dry. She is not diaphoretic.  Psychiatric: She has a normal mood and affect. Her behavior is normal.   Diabetic Foot Exam - Simple   Simple Foot Form Diabetic Foot exam was performed with the following findings: Yes 10/21/2018  9:48 AM  Visual Inspection See comments: Yes Sensation Testing Intact to touch and monofilament testing bilaterally: Yes Pulse Check Posterior Tibialis and Dorsalis pulse intact bilaterally: Yes Comments Feet are cavus.      BP 120/80   Pulse 66   Ht 5' 4.75" (1.645 m)   Wt  162 lb (73.5 kg)   SpO2 98%   BMI 27.17 kg/m  Wt Readings from Last 3 Encounters:  11/18/18 160 lb 4 oz (72.7 kg)  10/24/18 153 lb (69.4 kg)  10/21/18 162 lb (73.5 kg)   BP Readings from Last 3 Encounters:  11/18/18 136/78  10/24/18 130/84  10/21/18 120/80   Guideline developer:  UpToDate (see UpToDate for funding source) Date Released: June 2014  There are no preventive care reminders to display for this patient.  There are no preventive care reminders to display for this patient.  No results found for: TSH Lab Results  Component Value Date   WBC 5.9 10/22/2018   HGB 13.7 10/22/2018   HCT 42.0 10/22/2018   MCV 94.3 10/22/2018   PLT 269.0 10/22/2018   Lab Results  Component Value Date   NA 141 10/22/2018   K 4.2 10/22/2018   CO2 27 10/22/2018   GLUCOSE 139 (H) 10/22/2018   BUN 21 10/22/2018   CREATININE 0.69 10/22/2018   BILITOT 0.5 10/22/2018   ALKPHOS 63 10/22/2018   AST 18 10/22/2018   ALT 29 10/22/2018   PROT 7.1 10/22/2018   ALBUMIN 4.4 10/22/2018   CALCIUM 9.6 10/22/2018   GFR 85.32 10/22/2018   Lab Results  Component Value Date   CHOL 214 (H) 10/22/2018   Lab Results  Component Value Date   HDL 48.60 10/22/2018   Lab Results  Component Value Date   LDLCALC 147 (H) 10/22/2018   Lab Results  Component Value Date   TRIG 91.0 10/22/2018   Lab Results  Component Value Date   CHOLHDL 4 10/22/2018   Lab Results  Component Value Date   HGBA1C 6.4 10/22/2018   The 10-year ASCVD risk score Mikey Bussing DC Jr., et al., 2013) is: 12.8%   Values used to calculate the score:     Age: 54 years     Sex: Female     Is Non-Hispanic African American: No     Diabetic: Yes     Tobacco smoker: No     Systolic Blood Pressure: [id number] mmHg     Is BP treated: No     HDL Cholesterol: 48.6 mg/dL     Total Cholesterol: 214 mg/dL   Assessment & Plan:   Problem List Items Addressed This Visit      Endocrine   Controlled type 2 diabetes mellitus without  complication, without long-term current use of insulin ([facility])   Relevant Medications   metFORMIN (GLUCOPHAGE) 500 MG tablet   Other Relevant Orders   CBC (Completed)   Comprehensive metabolic panel (Completed)   Hemoglobin A1c (Completed)   Microalbumin / creatinine urine ratio (Completed)   Urinalysis, Routine w reflex microscopic (Completed)     Other   Elevated LDL cholesterol level - Primary   Relevant Orders   Lipid panel (Completed)    Other Visit Diagnoses    Healthcare maintenance       Relevant Orders   Ambulatory referral to Ophthalmology   Ambulatory referral to Dermatology   Need for influenza vaccination       Relevant Orders   Flu Vaccine QUAD High Dose(Fluad) (Completed)   Need for 23-polyvalent pneumococcal polysaccharide vaccine       Relevant Orders   Pneumococcal polysaccharide vaccine 23-valent greater than or equal to 2yo subcutaneous/IM (Completed)      Meds ordered this encounter  Medications  . DISCONTD: simvastatin (ZOCOR) 20 MG tablet    Sig: Take 1 tablet (20 mg total) by mouth at bedtime.    Dispense:  30 tablet    Refill:  3  . metFORMIN (GLUCOPHAGE) 500 MG tablet    Sig: Take 1 tablet (500 mg total) by mouth 2 (two) times daily with a meal.    Dispense:  180 tablet    Refill:  0    Follow-up: Return in about 3 months (around 01/21/2019).   She will go ahead and seek dental care.  Restart statin pending results of today's lipid panel.  Follow-up in 3 months.  She was given information on health maintenance and disease prevention.

## 2018-10-22 ENCOUNTER — Other Ambulatory Visit (INDEPENDENT_AMBULATORY_CARE_PROVIDER_SITE_OTHER): Payer: Medicare Other

## 2018-10-22 ENCOUNTER — Encounter: Payer: Medicare Other | Admitting: Family Medicine

## 2018-10-22 DIAGNOSIS — E119 Type 2 diabetes mellitus without complications: Secondary | ICD-10-CM | POA: Diagnosis not present

## 2018-10-22 DIAGNOSIS — E7800 Pure hypercholesterolemia, unspecified: Secondary | ICD-10-CM | POA: Diagnosis not present

## 2018-10-22 LAB — CBC
HCT: 42 % (ref 36.0–46.0)
Hemoglobin: 13.7 g/dL (ref 12.0–15.0)
MCHC: 32.6 g/dL (ref 30.0–36.0)
MCV: 94.3 fl (ref 78.0–100.0)
Platelets: 269 10*3/uL (ref 150.0–400.0)
RBC: 4.46 Mil/uL (ref 3.87–5.11)
RDW: 13.4 % (ref 11.5–15.5)
WBC: 5.9 10*3/uL (ref 4.0–10.5)

## 2018-10-22 LAB — URINALYSIS, ROUTINE W REFLEX MICROSCOPIC
Bilirubin Urine: NEGATIVE
Hgb urine dipstick: NEGATIVE
Ketones, ur: NEGATIVE
Leukocytes,Ua: NEGATIVE
Nitrite: NEGATIVE
RBC / HPF: NONE SEEN (ref 0–?)
Specific Gravity, Urine: 1.015 (ref 1.000–1.030)
Total Protein, Urine: NEGATIVE
Urine Glucose: NEGATIVE
Urobilinogen, UA: 0.2 (ref 0.0–1.0)
pH: 7 (ref 5.0–8.0)

## 2018-10-22 LAB — COMPREHENSIVE METABOLIC PANEL
ALT: 29 U/L (ref 0–35)
AST: 18 U/L (ref 0–37)
Albumin: 4.4 g/dL (ref 3.5–5.2)
Alkaline Phosphatase: 63 U/L (ref 39–117)
BUN: 21 mg/dL (ref 6–23)
CO2: 27 mEq/L (ref 19–32)
Calcium: 9.6 mg/dL (ref 8.4–10.5)
Chloride: 105 mEq/L (ref 96–112)
Creatinine, Ser: 0.69 mg/dL (ref 0.40–1.20)
GFR: 85.32 mL/min (ref >60.00)
Glucose, Bld: 139 mg/dL — ABNORMAL HIGH (ref 70–99)
Potassium: 4.2 mEq/L (ref 3.5–5.1)
Sodium: 141 mEq/L (ref 135–145)
Total Bilirubin: 0.5 mg/dL (ref 0.2–1.2)
Total Protein: 7.1 g/dL (ref 6.0–8.3)

## 2018-10-22 LAB — MICROALBUMIN / CREATININE URINE RATIO
Creatinine,U: 103.7 mg/dL
Microalb Creat Ratio: 0.7 mg/g (ref 0.0–30.0)
Microalb, Ur: <0.7 mg/dL (ref 0.0–1.9)

## 2018-10-22 LAB — LIPID PANEL
Cholesterol: 214 mg/dL — ABNORMAL HIGH (ref 0–200)
HDL: 48.6 mg/dL (ref >39.00)
LDL Cholesterol: 147 mg/dL — ABNORMAL HIGH (ref 0–99)
NonHDL: 165.31
Total CHOL/HDL Ratio: 4
Triglycerides: 91 mg/dL (ref 0.0–149.0)
VLDL: 18.2 mg/dL (ref 0.0–40.0)

## 2018-10-22 LAB — HEMOGLOBIN A1C: Hgb A1c MFr Bld: 6.4 % (ref 4.6–6.5)

## 2018-10-23 ENCOUNTER — Other Ambulatory Visit: Payer: Self-pay

## 2018-10-23 ENCOUNTER — Ambulatory Visit
Admission: RE | Admit: 2018-10-23 | Discharge: 2018-10-23 | Disposition: A | Discharge status: Home / Self Care | Payer: Medicare Other | Source: Ambulatory Visit | Attending: Family Medicine | Admitting: Family Medicine

## 2018-10-23 DIAGNOSIS — Z1231 Encounter for screening mammogram for malignant neoplasm of breast: Secondary | ICD-10-CM

## 2018-10-23 MED ORDER — SIMVASTATIN 20 MG PO TABS: 20.0000 mg | ORAL_TABLET | Freq: Every day | ORAL | 3 refills | Status: DC

## 2018-10-23 NOTE — Addendum Note (Signed)
Addended by: Abelino Derrick A on: 10/23/2018 11:02 AM   Modules accepted: Orders

## 2018-10-24 ENCOUNTER — Ambulatory Visit (INDEPENDENT_AMBULATORY_CARE_PROVIDER_SITE_OTHER): Payer: Medicare Other | Admitting: Obstetrics & Gynecology

## 2018-10-24 ENCOUNTER — Encounter: Payer: Self-pay | Admitting: Obstetrics & Gynecology

## 2018-10-24 VITALS — BP 130/84 | Ht 63.25 in | Wt 153.0 lb

## 2018-10-24 DIAGNOSIS — Z9189 Other specified personal risk factors, not elsewhere classified: Secondary | ICD-10-CM

## 2018-10-24 DIAGNOSIS — Z13820 Encounter for screening for osteoporosis: Secondary | ICD-10-CM

## 2018-10-24 DIAGNOSIS — Z01419 Encounter for gynecological examination (general) (routine) without abnormal findings: Secondary | ICD-10-CM

## 2018-10-24 DIAGNOSIS — Z1272 Encounter for screening for malignant neoplasm of vagina: Secondary | ICD-10-CM | POA: Diagnosis not present

## 2018-10-24 DIAGNOSIS — Z780 Asymptomatic menopausal state: Secondary | ICD-10-CM

## 2018-10-24 DIAGNOSIS — Z90710 Acquired absence of both cervix and uterus: Secondary | ICD-10-CM

## 2018-10-24 NOTE — Progress Notes (Signed)
[facility] Oct 22, 1955 [id number]   History:    63 y.o. G4P2A2L2 married.  Moved from [place] to be closer to the grandchildren.  RP:  New patient presenting for annual gyn exam   HPI: Status post TAH.  Menopause, well on no hormone replacement therapy.  No postmenopausal bleeding.  No pelvic pain.  No pain with intercourse.  Urine and bowel movements normal.  Breast normal.  Body mass index 26.89.  Good fitness and healthy nutrition.  Health labs with Dr. Alfonso Ramus.  Last colonoscopy 3 years ago  Past medical history,surgical history, family history and social history were all reviewed and documented in the EPIC chart.  Gynecologic History No LMP recorded. Patient has had a hysterectomy. Contraception: status post hysterectomy Last Pap: 2 yrs ago, normal per patient Last mammogram: 09/2018. Results were: Negative Bone Density: >2 yrs Colonoscopy: 3 yrs ago  Obstetric History OB History  Gravida Para Term Preterm AB Living  4 2     2 2   SAB TAB Ectopic Multiple Live Births  2            # Outcome Date GA Lbr Len/2nd Weight Sex Delivery Anes PTL Lv  4 SAB           3 SAB           2 Para           1 Para              ROS: A ROS was performed and pertinent positives and negatives are included in the history.  GENERAL: No fevers or chills. HEENT: No change in vision, no earache, sore throat or sinus congestion. NECK: No pain or stiffness. CARDIOVASCULAR: No chest pain or pressure. No palpitations. PULMONARY: No shortness of breath, cough or wheeze. GASTROINTESTINAL: No abdominal pain, nausea, vomiting or diarrhea, melena or bright red blood per rectum. GENITOURINARY: No urinary frequency, urgency, hesitancy or dysuria. MUSCULOSKELETAL: No joint or muscle pain, no back pain, no recent trauma. DERMATOLOGIC: No rash, no itching, no lesions. ENDOCRINE: No polyuria, polydipsia, no heat or cold intolerance. No recent change in weight. HEMATOLOGICAL: No anemia or easy bruising or  bleeding. NEUROLOGIC: No headache, seizures, numbness, tingling or weakness. PSYCHIATRIC: No depression, no loss of interest in normal activity or change in sleep pattern.     Exam:   BP 130/84    Ht 5' 3.25" (1.607 m)    Wt 153 lb (69.4 kg)    BMI 26.89 kg/m   Body mass index is 26.89 kg/m.  General appearance : Well developed well nourished female. No acute distress HEENT: Eyes: no retinal hemorrhage or exudates,  Neck supple, trachea midline, no carotid bruits, no thyroidmegaly Lungs: Clear to auscultation, no rhonchi or wheezes, or rib retractions  Heart: Regular rate and rhythm, no murmurs or gallops Breast:Examined in sitting and supine position were symmetrical in appearance, no palpable masses or tenderness,  no skin retraction, no nipple inversion, no nipple discharge, no skin discoloration, no axillary or supraclavicular lymphadenopathy Abdomen: no palpable masses or tenderness, no rebound or guarding Extremities: no edema or skin discoloration or tenderness  Pelvic: Vulva: Normal             Vagina: No gross lesions or discharge.  Pap reflex done.  Cervix/Uterus absent  Adnexa  Without masses or tenderness  Anus: Normal   Assessment/Plan:  63 y.o. female for annual exam   1. Encounter for Papanicolaou smear of vagina as part of  routine gynecological examination Gynecologic exam status post TAH and menopause.  Pap reflex done on the vaginal vault.  Breast exam normal.  Screening mammogram August 2020 was negative.  Colonoscopy 3 years ago.  Health labs with Dr. Alfonso Ramus.  Body mass index 26.89.  Continue with fitness and healthy nutrition.  2. S/P total hysterectomy  3. Postmenopause Well on no hormone replacement therapy.  4. Screening for osteoporosis Schedule bone density here now.  Vitamin D supplements, calcium intake of 1200 mg daily and regular weightbearing physical activity recommended. - DG Bone Density; Future  Princess Bruins MD, 11:15 AM 10/24/2018

## 2018-10-24 NOTE — Patient Instructions (Signed)
1. Encounter for Papanicolaou smear of vagina as part of routine gynecological examination Gynecologic exam status post TAH and menopause.  Pap reflex done on the vaginal vault.  Breast exam normal.  Screening mammogram August 2020 was negative.  Colonoscopy 3 years ago.  Health labs with Dr. Alfonso Ramus.  Body mass index 26.89.  Continue with fitness and healthy nutrition.  2. S/P total hysterectomy  3. Postmenopause Well on no hormone replacement therapy.  4. Screening for osteoporosis Schedule bone density here now.  Vitamin D supplements, calcium intake of 1200 mg daily and regular weightbearing physical activity recommended. - DG Bone Density; Future  Arlisha, it was a pleasure seeing you today!  I will inform you of your results as soon as they are available.

## 2018-10-27 LAB — PAP IG W/ RFLX HPV ASCU

## 2018-10-29 ENCOUNTER — Other Ambulatory Visit: Payer: Self-pay

## 2018-10-30 ENCOUNTER — Ambulatory Visit (INDEPENDENT_AMBULATORY_CARE_PROVIDER_SITE_OTHER): Payer: Medicare Other

## 2018-10-30 ENCOUNTER — Other Ambulatory Visit: Payer: Self-pay | Admitting: Obstetrics & Gynecology

## 2018-10-30 DIAGNOSIS — M810 Age-related osteoporosis without current pathological fracture: Secondary | ICD-10-CM | POA: Diagnosis not present

## 2018-10-30 DIAGNOSIS — Z780 Asymptomatic menopausal state: Secondary | ICD-10-CM

## 2018-10-30 DIAGNOSIS — Z13820 Encounter for screening for osteoporosis: Secondary | ICD-10-CM

## 2018-11-05 DIAGNOSIS — H6120 Impacted cerumen, unspecified ear: Secondary | ICD-10-CM | POA: Diagnosis not present

## 2018-11-17 DIAGNOSIS — E119 Type 2 diabetes mellitus without complications: Secondary | ICD-10-CM | POA: Diagnosis not present

## 2018-11-17 DIAGNOSIS — H40023 Open angle with borderline findings, high risk, bilateral: Secondary | ICD-10-CM | POA: Diagnosis not present

## 2018-11-18 ENCOUNTER — Other Ambulatory Visit: Payer: Self-pay

## 2018-11-18 ENCOUNTER — Encounter: Payer: Self-pay | Admitting: Gastroenterology

## 2018-11-18 ENCOUNTER — Ambulatory Visit (INDEPENDENT_AMBULATORY_CARE_PROVIDER_SITE_OTHER): Payer: Medicare Other | Admitting: Gastroenterology

## 2018-11-18 VITALS — BP 136/78 | HR 60 | Temp 98.0°F | Ht 63.5 in | Wt 160.2 lb

## 2018-11-18 DIAGNOSIS — Z8719 Personal history of other diseases of the digestive system: Secondary | ICD-10-CM | POA: Diagnosis not present

## 2018-11-18 DIAGNOSIS — K219 Gastro-esophageal reflux disease without esophagitis: Secondary | ICD-10-CM | POA: Diagnosis not present

## 2018-11-18 MED ORDER — SUCRALFATE 1 GM/10ML PO SUSP: 1.0000 g | Freq: Three times a day (TID) | ORAL | 1 refills | Status: DC

## 2018-11-18 MED ORDER — OMEPRAZOLE 40 MG PO CPDR: 40.0000 mg | DELAYED_RELEASE_CAPSULE | Freq: Every day | ORAL | 3 refills | Status: DC | PRN

## 2018-11-18 NOTE — Patient Instructions (Addendum)
You have been scheduled for an endoscopy. Please follow written instructions given to you at your visit today. If you use inhalers (even only as needed), please bring them with you on the day of your procedure.   We have sent the following medications to your pharmacy for you to pick up at your convenience: Omeprazole, Carafate   Thank you for choosing me and [facility].  Dr. Rush Landmark

## 2018-11-18 NOTE — Progress Notes (Addendum)
[place] VISIT   Primary Care Provider [name], MD [place] [address] [phone]  Referring Provider [name], MD [address] [place],  [place] [address] [phone]  Patient Profile: Alejandra Hess is a 63 y.o. female with a pmh significant for asthma, diabetes, hyperlipidemia, GERD, status post cholecystectomy, status post hysterectomy.  The patient presents to the [facility] for an evaluation and management of problem(s) noted below:  Problem List 1. Gastroesophageal reflux disease, esophagitis presence not specified   2. H/O esophagitis     History of Present Illness This is the patient's first visit to the outpatient [place] clinic.  The patient just moved to [place] to be closer to her daughter and [place].  She has for the last 10 years been on over-the-counter medications for GERD.  In 2017 the patient reportedly underwent an upper endoscopy where she was found to have "inflammation".  At that time she was started on Prilosec and then Dexilant and then Nexium and then back on omeprazole.  At one point in time she was taking Nexium twice daily.  At some point in the last 2 to 3 years her GI provider in [place] started her on domperidone for consideration of whether she may have functional dyspepsia.  She has been taking domperidone for the last 2 years.  She has had a nocturnal cough but has attributed this to her asthma and allergies.  The patient states that she has no true dysphagia.  However the acid reflux is persisting.  She never underwent a follow-up endoscopy to evaluate for healing of her "inflammation of the esophagus".  She is not clear if she ever had biopsies to rule out EOE or lymphocytic esophagitis.  The patient has a history of rectal bleeding and has had a hemorrhoidectomy previously.  If she takes magnesium pills on a daily  basis as well as continues a high-fiber diet she does not have constipation.  She had a history of prior vaginal deliveries of her children.  When she had her cholecystectomy performed years ago she reports having just a sick gallbladder but was not told that she had stones.  There is no family history of GI malignancies.  Patient has had a colonoscopy reportedly in 2017 we do not have access to those records.  She is a retired Marine scientist.  Patient does not take significant nonsteroidals.  She has never been on Carafate.  GI Review of Systems Positive as above Negative for odynophagia, globus, nausea, vomiting, early satiety, bloating, change in bowel habits, melena, hematochezia  Review of Systems General: Denies fevers/chills HEENT: Denies oral lesions Cardiovascular: Denies chest pain/palpitations Pulmonary: Denies shortness of breath Gastroenterological: See HPI Genitourinary: Denies darkened urine or hematuria Hematological: Denies easy bruising/bleeding Endocrine: Denies temperature intolerance Dermatological: Denies jaundice Psychological: Mood is stable   Medications Current Outpatient Medications  Medication Sig Dispense Refill  . albuterol (PROVENTIL HFA) 108 (90 Base) MCG/ACT inhaler Inhale into the lungs.    [name] albuterol (PROVENTIL) (2.5 MG/3ML) 0.083% nebulizer solution VVN TID  7  . azelastine (ASTELIN) 0.1 % nasal spray Place 2 sprays into both nostrils daily. Use in each nostril as directed 30 mL 2  . budesonide-formoterol (SYMBICORT) 160-4.5 MCG/ACT inhaler Inhale 1 puff into the lungs at bedtime as needed. 1 Inhaler 3  . cetirizine (ZYRTEC) 10 MG tablet Take 10 mg by mouth daily.    . Fluocinolone Acetonide 0.01 % OIL Place  4 drops into both ears daily as needed. 20 mL 2  . fluticasone (FLONASE) 50 MCG/ACT nasal spray Place 2 sprays into both nostrils daily.    . magnesium oxide (MAG-OX) 400 MG tablet Take 400 mg by mouth 2 (two) times daily.     [name] MELATONIN PO Take 5 mg by  mouth 2 (two) times daily.     . metFORMIN (GLUCOPHAGE-XR) 500 MG 24 hr tablet Take 1 tablet (500 mg total) by mouth at bedtime. 90 tablet 1  . Multiple Vitamin (MULTIVITAMIN) tablet Take 1 tablet by mouth daily.    [name] omeprazole (PRILOSEC) 40 MG capsule Take 1 capsule (40 mg total) by mouth daily as needed. 90 capsule 3  . predniSONE (DELTASONE) 5 MG tablet Take 5 mg by mouth as needed.    . Probiotic Product (PROBIOTIC PO) Take 1 capsule by mouth 2 (two) times daily.     . simvastatin (ZOCOR) 20 MG tablet Take 1 tablet (20 mg total) by mouth at bedtime. 30 tablet 3  . sucralfate (CARAFATE) 1 GM/10ML suspension Take 10 mLs (1 g total) by mouth 4 (four) times daily -  with meals and at bedtime. 420 mL 1   No current facility-administered medications for this visit.     Allergies Allergies  Allergen Reactions  . Azithromycin Rash  . Penicillins Hives and Rash    Histories Past Medical History:  Diagnosis Date  . Asthma   . DM (diabetes mellitus) ([place])   . GERD (gastroesophageal reflux disease)   . Heart murmur   . HLD (hyperlipidemia)    Past Surgical History:  Procedure Laterality Date  . ABDOMINAL HYSTERECTOMY    . AUGMENTATION MAMMAPLASTY Bilateral    @ 43 lift with implants  . BREAST EXCISIONAL BIOPSY Right    @ 53  . BREAST EXCISIONAL BIOPSY Left    @43 ?  . CHOLECYSTECTOMY     Social History   Socioeconomic History  . Marital status: Married    Spouse name: Not on file  . Number of children: 2  . Years of education: Not on file  . Highest education level: Not on file  Occupational History  . Occupation: Therapist, sports  Social Needs  . Financial resource strain: Not on file  . Food insecurity    Worry: Not on file    Inability: Not on file  . Transportation needs    Medical: Not on file    Non-medical: Not on file  Tobacco Use  . Smoking status: Former Smoker    Types: Cigarettes    Quit date: 2013    Years since quitting: 7.7  . Smokeless tobacco: Never Used   Substance and Sexual Activity  . Alcohol use: Yes    Comment: 2 glasses of red wine daily  . Drug use: Never  . Sexual activity: Yes    Partners: Male    Comment: 1st intercourse- 89, partners- 5-, MARRIED- 25  Lifestyle  . Physical activity    Days per week: Not on file    Minutes per session: Not on file  . Stress: Not on file  Relationships  . Social Herbalist on phone: Not on file    Gets together: Not on file    Attends religious service: Not on file    Active member of club or organization: Not on file    Attends meetings of clubs or organizations: Not on file    Relationship status: Not on file  . Intimate partner  violence    Fear of current or ex partner: Not on file    Emotionally abused: Not on file    Physically abused: Not on file    Forced sexual activity: Not on file  Other Topics Concern  . Not on file  Social History Narrative  . Not on file   Family History  Problem Relation Age of Onset  . Hearing loss Mother   . Hyperlipidemia Mother   . Hypertension Mother   . Diabetes Mother   . Heart attack Father   . Heart disease Father   . Hyperlipidemia Father   . Hypertension Father   . Melanoma Father   . Asthma Sister   . Hyperlipidemia Sister   . Hypertension Sister   . Hyperlipidemia Sister   . Breast cancer Sister 4  . Asthma Sister   . Depression Sister   . Heart attack Sister   . Heart disease Sister   . Hypertension Sister   . Diabetes Sister   . Breast cancer Maternal Aunt        over 42  . Breast cancer Paternal Aunt        over 83  . Breast cancer Paternal Grandmother        over 31   . Breast cancer Maternal Aunt        over 33   . Colon cancer Neg Hx   . Esophageal cancer Neg Hx   . Inflammatory bowel disease Neg Hx   . Liver disease Neg Hx   . Pancreatic cancer Neg Hx   . Rectal cancer Neg Hx   . Stomach cancer Neg Hx    I have reviewed her medical, social, and family history in detail and updated the electronic  medical record as necessary.    PHYSICAL EXAMINATION  BP 136/78 (BP Location: Left Arm, Patient Position: Sitting, Cuff Size: Normal)   Pulse 60   Temp 98 F (36.7 C)   Ht 5' 3.5" (1.613 m) Comment: height measured without shoes  Wt 160 lb 4 oz (72.7 kg)   BMI 27.94 kg/m  Wt Readings from Last 3 Encounters:  11/18/18 160 lb 4 oz (72.7 kg)  10/24/18 153 lb (69.4 kg)  10/21/18 162 lb (73.5 kg)  GEN: NAD, appears stated age, doesn't appear chronically ill PSYCH: Cooperative, without pressured speech EYE: Conjunctivae pink, sclerae anicteric ENT: MMM, without oral ulcers, no erythema or exudates noted NECK: Supple CV: RR without R/Gs  RESP: CTAB posteriorly, without wheezing GI: NABS, soft, NT/ND, without rebound or guarding, no HSM appreciated MSK/EXT: No lower extremity edema SKIN: No jaundice NEURO:  Alert & Oriented x 3, no focal deficits   REVIEW OF DATA  I reviewed the following data at the time of this encounter:  GI Procedures and Studies  Reportedly has had upper endoscopy and colonoscopy will have to work on trying to obtain those records  Laboratory Studies  Reviewed those in epic  Imaging Studies  2014 care everywhere ultrasound right upper quadrant Findings: Visualized portions of the pancreas, abdominal aorta, and inferior vena cava appear normal, the liver measures 14.4 cm, with normal directional flow seen within the portal vein. Overall echogenicity appears normal. The gallbladder shows no gallstones nor gallbladder wall thickening nor sonographic Murphy's sign. The common duct is normal at 4 mm, the right kidney measures 9.4 cm. Impression: Unremarkable limited abdomen ultrasound.   ASSESSMENT  Ms. Guider is a 63 y.o. female with a pmh significant for asthma, diabetes, hyperlipidemia,  GERD, status post cholecystectomy, status post hysterectomy.  The patient is seen today for evaluation and management of:  1. Gastroesophageal reflux disease,  esophagitis presence not specified   2. H/O esophagitis    Patient is hemodynamically stable.  She presents with a longstanding history of what seems to be GERD with prior discussion of possibly having esophagitis.  She has never been checked for healing even while she is been on multiple PPI therapies.  I wonder if she could have a complex of some bile reflux gastritis as well.  We are going to continue her acid reducing PPI at once daily dosing for now but may consider increasing.  I also wonder about how she may tolerate the use of Carafate.  I would like to initiate this and see if she can tolerate that and she will try to take it at least 2 times per day but try to increase up to 4 times per day with each meal.  She understands that she cannot take a medication between 1 hour before 1 hour after ingestion of Carafate.  The patient will benefit from a repeat diagnostic upper endoscopy to evaluate the upper GI tract and see if she has any evidence of eosinophilic esophagitis or lymphocytic esophagitis and/or persistent esophagitis.  We will rule out that she does not have a hiatal hernia either.  We briefly discussed the role of nonmedical therapies for GERD including surgical fundoplication as well as potentially endoscopic fundoplication or other therapy such as the [place].  The patient is not desiring of a surgical intervention at this point but would like to just get a better sense of things.  We will plan to see how she does with Carafate and proceed with an upper endoscopy in a few weeks.  The risks and benefits of endoscopic evaluation were discussed with the patient; these include but are not limited to the risk of perforation, infection, bleeding, missed lesions, lack of diagnosis, severe illness requiring hospitalization, as well as anesthesia and sedation related illnesses.  The patient is agreeable to proceed.  All patient questions were answered, to the best of my ability, and the patient agrees to  the aforementioned plan of action with follow-up as indicated.   PLAN  Continue omeprazole 40 mg daily (take 30 minutes before meal) Begin Carafate 10 mils 4 times daily with meals and at bedtime (do not take medications 1 hour before 1 hour after ingestion of Carafate) Obtain out side EGD/colonoscopy report from prior gastroenterologist Diagnostic endoscopy (esophageal/gastric biopsies to be obtained) Further testing/evaluation such as pH impedance testing will be considered in the future based on findings on endoscopy and patient's symptoms Lifestyle modifications -Eat meals >3 hours before bedtime -Do not lay down or lie flat immediately after eating for at least 2-hours -Do not overeat (decrease portion size and/or eat more frequent smaller meals) -Eat slowly -Wear Loose-fitting clothes -Raise Head of Bed (Head & Chest > Feet with bed blocks) -Avoid foods that trigger the symptoms (Onions, Chocolate, Caffeine, Spicy, and Fatty) -Work on Losing Weight -Increase Exercise Activity -Maintain Heartburn Diary/Log   Orders Placed This Encounter  Procedures  . Ambulatory referral to Gastroenterology    New Prescriptions   SUCRALFATE (CARAFATE) 1 GM/10ML SUSPENSION    Take 10 mLs (1 g total) by mouth 4 (four) times daily -  with meals and at bedtime.   Modified Medications   Modified Medication Previous Medication   OMEPRAZOLE (PRILOSEC) 40 MG CAPSULE omeprazole (PRILOSEC) 40 MG capsule  Take 1 capsule (40 mg total) by mouth daily as needed.    Take 1 capsule (40 mg total) by mouth daily as needed.    Planned Follow Up No follow-ups on file.   Justice Britain, MD [facility] # [phone]

## 2018-11-20 ENCOUNTER — Encounter: Payer: Self-pay | Admitting: Gastroenterology

## 2018-11-21 DIAGNOSIS — K219 Gastro-esophageal reflux disease without esophagitis: Secondary | ICD-10-CM | POA: Insufficient documentation

## 2018-11-21 DIAGNOSIS — Z8719 Personal history of other diseases of the digestive system: Secondary | ICD-10-CM | POA: Insufficient documentation

## 2018-11-24 ENCOUNTER — Encounter: Payer: Self-pay | Admitting: Family Medicine

## 2018-11-24 DIAGNOSIS — H409 Unspecified glaucoma: Secondary | ICD-10-CM | POA: Insufficient documentation

## 2018-11-29 ENCOUNTER — Encounter: Payer: Self-pay | Admitting: Family Medicine

## 2018-11-29 DIAGNOSIS — Z0000 Encounter for general adult medical examination without abnormal findings: Secondary | ICD-10-CM

## 2018-11-30 ENCOUNTER — Other Ambulatory Visit: Payer: Self-pay | Admitting: Family Medicine

## 2018-11-30 DIAGNOSIS — E7800 Pure hypercholesterolemia, unspecified: Secondary | ICD-10-CM

## 2018-11-30 DIAGNOSIS — E119 Type 2 diabetes mellitus without complications: Secondary | ICD-10-CM

## 2018-12-02 ENCOUNTER — Encounter: Payer: Self-pay | Admitting: Family Medicine

## 2018-12-02 MED ORDER — METFORMIN HCL 500 MG PO TABS: 500.0000 mg | ORAL_TABLET | Freq: Two times a day (BID) | ORAL | 0 refills | Status: DC

## 2018-12-02 NOTE — Addendum Note (Signed)
Addended by: Abelino Derrick A on: 12/02/2018 11:06 AM   Modules accepted: Orders

## 2018-12-07 ENCOUNTER — Encounter: Payer: Self-pay | Admitting: Gastroenterology

## 2018-12-07 NOTE — Progress Notes (Unsigned)
Review of outside records to be scanned in the chart  2017 colonoscopy Sessile polyp between 3 and 5 mm in size found at the appendiceal orifice removed with cold forceps.  A sessile polyps ranging between 3 to 5 mm found in the sigmoid colon.  Removed with cold forceps.  2017 endoscopy At least grade B esophagitis was present. Stomach was normal. Duodenum was normal.  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  Sigmoid polyp was hyperplastic.  Based on the timing of this last procedure her follow-up would have been a 5-year colonoscopy thus we will wait for 2022 for her colonoscopy unless she has other symptoms develop from a lower perspective.  Justice Britain, MD [facility] # [phone]

## 2018-12-08 ENCOUNTER — Ambulatory Visit (INDEPENDENT_AMBULATORY_CARE_PROVIDER_SITE_OTHER): Payer: Medicare Other | Admitting: Family Medicine

## 2018-12-08 ENCOUNTER — Encounter: Payer: Self-pay | Admitting: Family Medicine

## 2018-12-08 ENCOUNTER — Other Ambulatory Visit: Payer: Self-pay

## 2018-12-08 DIAGNOSIS — B349 Viral infection, unspecified: Secondary | ICD-10-CM | POA: Insufficient documentation

## 2018-12-08 DIAGNOSIS — J4521 Mild intermittent asthma with (acute) exacerbation: Secondary | ICD-10-CM | POA: Diagnosis not present

## 2018-12-08 DIAGNOSIS — R509 Fever, unspecified: Secondary | ICD-10-CM | POA: Diagnosis not present

## 2018-12-08 DIAGNOSIS — U071 COVID-19: Secondary | ICD-10-CM | POA: Diagnosis not present

## 2018-12-08 DIAGNOSIS — R519 Headache, unspecified: Secondary | ICD-10-CM | POA: Diagnosis not present

## 2018-12-08 DIAGNOSIS — J45909 Unspecified asthma, uncomplicated: Secondary | ICD-10-CM | POA: Insufficient documentation

## 2018-12-08 MED ORDER — PREDNISONE 20 MG PO TABS: 20.0000 mg | ORAL_TABLET | Freq: Two times a day (BID) | ORAL | 0 refills | Status: AC

## 2018-12-08 NOTE — Progress Notes (Signed)
Established Patient Office Visit  Subjective:  Patient ID: Alejandra Hess, female    DOB: 11-12-1955  Age: 63 y.o. MRN: [id number]  CC:  Chief Complaint  Patient presents with  . covid symptoms    HPI Alejandra Hess presents for evaluation treatment of a 3-day history of elevated temperatures up to 100, chills fatigue headache stuffy nose drainage sinus pain cough with increased wheezing.  There has been some heaviness in her chest.  Her O2 sats have been running at 96 which is normal for her.  She has a history of asthma the distant history of tobacco use.  She denies difficulties with urination arthralgias or myalgias.  She went to urgent care this morning and was tested for Covid.  Examining doctor had noted rhonchi at her bases.  Denies increased rescue inhaler use.  She was instructed by her pulmonologist to increase her Symbicort with a respiratory tract illness.  Patient denies any aberrations in her senses of taste or smell.  She has been taking Advil sinus that has increased her blood pressure.  She has since discontinued it.  Patient lives with her husband.  There is no one else who lives in the home with them.  Patient's daughter is available to pick up prescriptions and other needed things. Past Medical History:  Diagnosis Date  . Asthma   . DM (diabetes mellitus) ([place])   . GERD (gastroesophageal reflux disease)   . Heart murmur   . HLD (hyperlipidemia)     Past Surgical History:  Procedure Laterality Date  . ABDOMINAL HYSTERECTOMY    . AUGMENTATION MAMMAPLASTY Bilateral    @ 43 lift with implants  . BREAST EXCISIONAL BIOPSY Right    @ 53  . BREAST EXCISIONAL BIOPSY Left    @43 ?  . CHOLECYSTECTOMY      Family History  Problem Relation Age of Onset  . Hearing loss Mother   . Hyperlipidemia Mother   . Hypertension Mother   . Diabetes Mother   . Heart attack Father   . Heart disease Father   . Hyperlipidemia Father   . Hypertension Father   . Melanoma Father    . Asthma Sister   . Hyperlipidemia Sister   . Hypertension Sister   . Hyperlipidemia Sister   . Breast cancer Sister 30  . Asthma Sister   . Depression Sister   . Heart attack Sister   . Heart disease Sister   . Hypertension Sister   . Diabetes Sister   . Breast cancer Maternal Aunt        over 76  . Breast cancer Paternal Aunt        over 63  . Breast cancer Paternal Grandmother        over 82   . Breast cancer Maternal Aunt        over 19   . Colon cancer Neg Hx   . Esophageal cancer Neg Hx   . Inflammatory bowel disease Neg Hx   . Liver disease Neg Hx   . Pancreatic cancer Neg Hx   . Rectal cancer Neg Hx   . Stomach cancer Neg Hx     Social History   Socioeconomic History  . Marital status: Married    Spouse name: Not on file  . Number of children: 2  . Years of education: Not on file  . Highest education level: Not on file  Occupational History  . Occupation: Therapist, sports  Social Needs  . Financial resource strain: Not  on file  . Food insecurity    Worry: Not on file    Inability: Not on file  . Transportation needs    Medical: Not on file    Non-medical: Not on file  Tobacco Use  . Smoking status: Former Smoker    Types: Cigarettes    Quit date: 2013    Years since quitting: 7.7  . Smokeless tobacco: Never Used  Substance and Sexual Activity  . Alcohol use: Yes    Comment: 2 glasses of red wine daily  . Drug use: Never  . Sexual activity: Yes    Partners: Male    Comment: 1st intercourse- 54, partners- 5-, MARRIED- 2  Lifestyle  . Physical activity    Days per week: Not on file    Minutes per session: Not on file  . Stress: Not on file  Relationships  . Social Herbalist on phone: Not on file    Gets together: Not on file    Attends religious service: Not on file    Active member of club or organization: Not on file    Attends meetings of clubs or organizations: Not on file    Relationship status: Not on file  . Intimate partner violence     Fear of current or ex partner: Not on file    Emotionally abused: Not on file    Physically abused: Not on file    Forced sexual activity: Not on file  Other Topics Concern  . Not on file  Social History Narrative  . Not on file    Outpatient Medications Prior to Visit  Medication Sig Dispense Refill  . albuterol (PROVENTIL HFA) 108 (90 Base) MCG/ACT inhaler Inhale into the lungs.    [name] albuterol (PROVENTIL) (2.5 MG/3ML) 0.083% nebulizer solution VVN TID  7  . azelastine (ASTELIN) 0.1 % nasal spray Place 2 sprays into both nostrils daily. Use in each nostril as directed 30 mL 2  . budesonide-formoterol (SYMBICORT) 160-4.5 MCG/ACT inhaler Inhale 1 puff into the lungs at bedtime as needed. 1 Inhaler 3  . cetirizine (ZYRTEC) 10 MG tablet Take 10 mg by mouth daily.    . Fluocinolone Acetonide 0.01 % OIL Place 4 drops into both ears daily as needed. 20 mL 2  . fluticasone (FLONASE) 50 MCG/ACT nasal spray Place 2 sprays into both nostrils daily.    . magnesium oxide (MAG-OX) 400 MG tablet Take 400 mg by mouth 2 (two) times daily.     [name] MELATONIN PO Take 5 mg by mouth 2 (two) times daily.     . metFORMIN (GLUCOPHAGE) 500 MG tablet Take 1 tablet (500 mg total) by mouth 2 (two) times daily with a meal. 180 tablet 0  . Multiple Vitamin (MULTIVITAMIN) tablet Take 1 tablet by mouth daily.    [name] omeprazole (PRILOSEC) 40 MG capsule Take 1 capsule (40 mg total) by mouth daily as needed. 90 capsule 3  . predniSONE (DELTASONE) 5 MG tablet Take 5 mg by mouth as needed.    . Probiotic Product (PROBIOTIC PO) Take 1 capsule by mouth 2 (two) times daily.     . simvastatin (ZOCOR) 20 MG tablet TAKE ONE TABLET BY MOUTH AT BEDTIME 90 tablet 1  . sucralfate (CARAFATE) 1 GM/10ML suspension Take 10 mLs (1 g total) by mouth 4 (four) times daily -  with meals and at bedtime. 420 mL 1   No facility-administered medications prior to visit.     Allergies  Allergen  Reactions  . Azithromycin Rash  . Penicillins  Hives and Rash    ROS Review of Systems  Constitutional: Positive for diaphoresis and fever. Negative for chills, fatigue and unexpected weight change.  HENT: Positive for congestion, postnasal drip, rhinorrhea and sinus pain. Negative for sore throat and voice change.   Eyes: Negative for photophobia and visual disturbance.  Respiratory: Positive for cough and wheezing. Negative for choking.   Genitourinary: Negative.   Musculoskeletal: Negative for arthralgias and myalgias.  Neurological: Positive for headaches.  Hematological: Does not bruise/bleed easily.  Psychiatric/Behavioral: Negative.       Objective:    Physical Exam  Constitutional: She is oriented to person, place, and time. She appears well-developed and well-nourished. No distress.  HENT:  Head: Normocephalic and atraumatic.  Right Ear: External ear normal.  Left Ear: External ear normal.  Eyes: Conjunctivae are normal. Right eye exhibits no discharge. Left eye exhibits no discharge. No scleral icterus.  Neck: No JVD present. No tracheal deviation present.  Pulmonary/Chest: Effort normal. No stridor.  Neurological: She is alert and oriented to person, place, and time.  Skin: Skin is warm and dry. She is not diaphoretic.  Psychiatric: She has a normal mood and affect. Her behavior is normal.    There were no vitals taken for this visit. Wt Readings from Last 3 Encounters:  11/18/18 160 lb 4 oz (72.7 kg)  10/24/18 153 lb (69.4 kg)  10/21/18 162 lb (73.5 kg)   BP Readings from Last 3 Encounters:  11/18/18 136/78  10/24/18 130/84  10/21/18 120/80   Guideline developer:  UpToDate (see UpToDate for funding source) Date Released: June 2014  There are no preventive care reminders to display for this patient.  There are no preventive care reminders to display for this patient.  No results found for: TSH Lab Results  Component Value Date   WBC 5.9 10/22/2018   HGB 13.7 10/22/2018   HCT 42.0 10/22/2018    MCV 94.3 10/22/2018   PLT 269.0 10/22/2018   Lab Results  Component Value Date   NA 141 10/22/2018   K 4.2 10/22/2018   CO2 27 10/22/2018   GLUCOSE 139 (H) 10/22/2018   BUN 21 10/22/2018   CREATININE 0.69 10/22/2018   BILITOT 0.5 10/22/2018   ALKPHOS 63 10/22/2018   AST 18 10/22/2018   ALT 29 10/22/2018   PROT 7.1 10/22/2018   ALBUMIN 4.4 10/22/2018   CALCIUM 9.6 10/22/2018   GFR 85.32 10/22/2018   Lab Results  Component Value Date   CHOL 214 (H) 10/22/2018   Lab Results  Component Value Date   HDL 48.60 10/22/2018   Lab Results  Component Value Date   LDLCALC 147 (H) 10/22/2018   Lab Results  Component Value Date   TRIG 91.0 10/22/2018   Lab Results  Component Value Date   CHOLHDL 4 10/22/2018   Lab Results  Component Value Date   HGBA1C 6.4 10/22/2018      Assessment & Plan:   Problem List Items Addressed This Visit      Respiratory   Reactive airway disease   Relevant Medications   predniSONE (DELTASONE) 20 MG tablet     Other   Viral syndrome - Primary      Meds ordered this encounter  Medications  . predniSONE (DELTASONE) 20 MG tablet    Sig: Take 1 tablet (20 mg total) by mouth 2 (two) times daily with a meal for 7 days.    Dispense:  14 tablet  Refill:  0    Follow-up: No follow-ups on file.   Patient will continue her Symbicort and take above prescribed prednisone.  She will continue to monitor her pulse ox is.  She will follow-up in 1 week or sooner if not improving on the prednisone therapy.  She will go to the emergency room with increased difficulty breathing and falling O2 sats.  Patient and her husband will quarantine at home for the next few weeks. Virtual Visit via Video Note  I connected with Alejandra Hess on 12/08/18 at  2:30 PM EDT by a video enabled telemedicine application and verified that I am speaking with the correct person using two identifiers.  Location: Patient: home Provider:    I discussed the  limitations of evaluation and management by telemedicine and the availability of in person appointments. The patient expressed understanding and agreed to proceed.  History of Present Illness:    Observations/Objective:   Assessment and Plan:   Follow Up Instructions:    I discussed the assessment and treatment plan with the patient. The patient was provided an opportunity to ask questions and all were answered. The patient agreed with the plan and demonstrated an understanding of the instructions.   The patient was advised to call back or seek an in-person evaluation if the symptoms worsen or if the condition fails to improve as anticipated.  I provided 20 minutes of non-face-to-face time during this encounter.   [name], MD

## 2018-12-09 ENCOUNTER — Encounter: Payer: Medicare Other | Admitting: Gastroenterology

## 2018-12-10 ENCOUNTER — Encounter: Payer: Self-pay | Admitting: Family Medicine

## 2018-12-11 NOTE — Telephone Encounter (Signed)
Message received this afternoon from Patient.  I will continue to message you as I progress. I see Dr Ethelene Hal tomorrow at three. Dr Ethelene Hal I'm sure will feel comfortable having your opinion Thank you [facility] routed to Dr. Melvyn Novas as Juluis Rainier

## 2018-12-11 NOTE — Telephone Encounter (Signed)
Dr. Melvyn Novas, this message was received for you today.  Dear Dr Georges Mouse my Covid test come back test for Covid came back positive.  My question is, do you think What we are doing with Dr Ethelene Hal is adequate for my lung condition? Do we to need To add any medications to treatments.  This is what I am taking now. Am-Motrin cold with pseudoephedrine, Symbacort inhaler, 20 of prednisone and a breathing treatment, at noon Motrin cold without pseudoephedrine and at dinner 20 of prednisone and at bedtime two Benadryl and my azetaline nose spray.  Please let me know what you think and if you have any concerns.  Thank you  Alejandra Hess routed to Dr. Melvyn Novas

## 2018-12-12 ENCOUNTER — Ambulatory Visit (INDEPENDENT_AMBULATORY_CARE_PROVIDER_SITE_OTHER): Payer: Medicare Other | Admitting: Family Medicine

## 2018-12-12 ENCOUNTER — Encounter: Payer: Self-pay | Admitting: Family Medicine

## 2018-12-12 ENCOUNTER — Other Ambulatory Visit: Payer: Self-pay

## 2018-12-12 DIAGNOSIS — U071 COVID-19: Secondary | ICD-10-CM | POA: Insufficient documentation

## 2018-12-12 MED ORDER — PREDNISONE 10 MG PO TABS: 10.0000 mg | ORAL_TABLET | Freq: Two times a day (BID) | ORAL | 0 refills | Status: AC

## 2018-12-12 NOTE — Progress Notes (Signed)
Established Patient Office Visit  Subjective:  Patient ID: Alejandra Hess, female    DOB: 06/06/1955  Age: 63 y.o. MRN: [id number]  CC:  Chief Complaint  Patient presents with  . Follow-up    HPI Alejandra Hess presents for follow-up of her recently diagnosed with Covid infection.  She is slowly improving but remains fatigued.  She has been having some lower back pain but denies any increased urine frequency dysuria or urgency.  She has done well on the prednisone 20 twice daily.  She does have some wheezing at the nighttime when she goes to bed.  O2 sats have been remaining in the 96 range.  She is continuing her asthma inhalers.  Cough is resolving.  There is no sputum or rhinorrhea at this point temperatures pretty consistently at 99.  Past Medical History:  Diagnosis Date  . Asthma   . DM (diabetes mellitus) ([place])   . GERD (gastroesophageal reflux disease)   . Heart murmur   . HLD (hyperlipidemia)     Past Surgical History:  Procedure Laterality Date  . ABDOMINAL HYSTERECTOMY    . AUGMENTATION MAMMAPLASTY Bilateral    @ 43 lift with implants  . BREAST EXCISIONAL BIOPSY Right    @ 53  . BREAST EXCISIONAL BIOPSY Left    @43 ?  . CHOLECYSTECTOMY      Family History  Problem Relation Age of Onset  . Hearing loss Mother   . Hyperlipidemia Mother   . Hypertension Mother   . Diabetes Mother   . Heart attack Father   . Heart disease Father   . Hyperlipidemia Father   . Hypertension Father   . Melanoma Father   . Asthma Sister   . Hyperlipidemia Sister   . Hypertension Sister   . Hyperlipidemia Sister   . Breast cancer Sister 100  . Asthma Sister   . Depression Sister   . Heart attack Sister   . Heart disease Sister   . Hypertension Sister   . Diabetes Sister   . Breast cancer Maternal Aunt        over 92  . Breast cancer Paternal Aunt        over 68  . Breast cancer Paternal Grandmother        over 42   . Breast cancer Maternal Aunt        over 60   .  Colon cancer Neg Hx   . Esophageal cancer Neg Hx   . Inflammatory bowel disease Neg Hx   . Liver disease Neg Hx   . Pancreatic cancer Neg Hx   . Rectal cancer Neg Hx   . Stomach cancer Neg Hx     Social History   Socioeconomic History  . Marital status: Married    Spouse name: Not on file  . Number of children: 2  . Years of education: Not on file  . Highest education level: Not on file  Occupational History  . Occupation: Therapist, sports  Social Needs  . Financial resource strain: Not on file  . Food insecurity    Worry: Not on file    Inability: Not on file  . Transportation needs    Medical: Not on file    Non-medical: Not on file  Tobacco Use  . Smoking status: Former Smoker    Types: Cigarettes    Quit date: 2013    Years since quitting: 7.7  . Smokeless tobacco: Never Used  Substance and Sexual Activity  . Alcohol use: Yes  Comment: 2 glasses of red wine daily  . Drug use: Never  . Sexual activity: Yes    Partners: Male    Comment: 1st intercourse- 71, partners- 5-, MARRIED- 23  Lifestyle  . Physical activity    Days per week: Not on file    Minutes per session: Not on file  . Stress: Not on file  Relationships  . Social Herbalist on phone: Not on file    Gets together: Not on file    Attends religious service: Not on file    Active member of club or organization: Not on file    Attends meetings of clubs or organizations: Not on file    Relationship status: Not on file  . Intimate partner violence    Fear of current or ex partner: Not on file    Emotionally abused: Not on file    Physically abused: Not on file    Forced sexual activity: Not on file  Other Topics Concern  . Not on file  Social History Narrative  . Not on file    Outpatient Medications Prior to Visit  Medication Sig Dispense Refill  . albuterol (PROVENTIL HFA) 108 (90 Base) MCG/ACT inhaler Inhale into the lungs.    [name] albuterol (PROVENTIL) (2.5 MG/3ML) 0.083% nebulizer solution  VVN TID  7  . azelastine (ASTELIN) 0.1 % nasal spray Place 2 sprays into both nostrils daily. Use in each nostril as directed 30 mL 2  . budesonide-formoterol (SYMBICORT) 160-4.5 MCG/ACT inhaler Inhale 1 puff into the lungs at bedtime as needed. 1 Inhaler 3  . cetirizine (ZYRTEC) 10 MG tablet Take 10 mg by mouth daily.    . Fluocinolone Acetonide 0.01 % OIL Place 4 drops into both ears daily as needed. 20 mL 2  . fluticasone (FLONASE) 50 MCG/ACT nasal spray Place 2 sprays into both nostrils daily.    . magnesium oxide (MAG-OX) 400 MG tablet Take 400 mg by mouth 2 (two) times daily.     [name] MELATONIN PO Take 5 mg by mouth 2 (two) times daily.     . metFORMIN (GLUCOPHAGE) 500 MG tablet Take 1 tablet (500 mg total) by mouth 2 (two) times daily with a meal. 180 tablet 0  . Multiple Vitamin (MULTIVITAMIN) tablet Take 1 tablet by mouth daily.    [name] omeprazole (PRILOSEC) 40 MG capsule Take 1 capsule (40 mg total) by mouth daily as needed. 90 capsule 3  . predniSONE (DELTASONE) 20 MG tablet Take 1 tablet (20 mg total) by mouth 2 (two) times daily with a meal for 7 days. 14 tablet 0  . predniSONE (DELTASONE) 5 MG tablet Take 5 mg by mouth as needed.    . Probiotic Product (PROBIOTIC PO) Take 1 capsule by mouth 2 (two) times daily.     . simvastatin (ZOCOR) 20 MG tablet TAKE ONE TABLET BY MOUTH AT BEDTIME 90 tablet 1  . sucralfate (CARAFATE) 1 GM/10ML suspension Take 10 mLs (1 g total) by mouth 4 (four) times daily -  with meals and at bedtime. 420 mL 1   No facility-administered medications prior to visit.     Allergies  Allergen Reactions  . Azithromycin Rash  . Penicillins Hives and Rash    ROS Review of Systems  Constitutional: Negative for chills, diaphoresis, fatigue, fever and unexpected weight change.  HENT: Positive for postnasal drip. Negative for rhinorrhea, sinus pressure and sinus pain.   Eyes: Negative for photophobia and visual disturbance.  Respiratory: Positive  for wheezing.  Negative for shortness of breath.   Cardiovascular: Negative.   Gastrointestinal: Negative for diarrhea, nausea, rectal pain and vomiting.  Genitourinary: Negative for difficulty urinating, dysuria, frequency and vaginal discharge.  Musculoskeletal: Positive for back pain and myalgias.  Allergic/Immunologic: Negative for immunocompromised state.  Neurological: Negative for seizures and speech difficulty.  Hematological: Negative.   Psychiatric/Behavioral: Negative.       Objective:    Physical Exam  Constitutional: She is oriented to person, place, and time. She appears well-developed and well-nourished. No distress.  HENT:  Head: Normocephalic and atraumatic.  Right Ear: External ear normal.  Left Ear: External ear normal.  Eyes: Right eye exhibits no discharge. Left eye exhibits no discharge. No scleral icterus.  Neck: No JVD present. No tracheal deviation present.  Cardiovascular: Normal rate, regular rhythm and normal heart sounds.  Pulmonary/Chest: Effort normal and breath sounds normal. No stridor.  Neurological: She is alert and oriented to person, place, and time.  Skin: She is not diaphoretic.  Psychiatric: She has a normal mood and affect. Her behavior is normal.    There were no vitals taken for this visit. Wt Readings from Last 3 Encounters:  11/18/18 160 lb 4 oz (72.7 kg)  10/24/18 153 lb (69.4 kg)  10/21/18 162 lb (73.5 kg)   BP Readings from Last 3 Encounters:  11/18/18 136/78  10/24/18 130/84  10/21/18 120/80   Guideline developer:  UpToDate (see UpToDate for funding source) Date Released: June 2014  There are no preventive care reminders to display for this patient.  There are no preventive care reminders to display for this patient.  No results found for: TSH Lab Results  Component Value Date   WBC 5.9 10/22/2018   HGB 13.7 10/22/2018   HCT 42.0 10/22/2018   MCV 94.3 10/22/2018   PLT 269.0 10/22/2018   Lab Results  Component Value Date   NA  141 10/22/2018   K 4.2 10/22/2018   CO2 27 10/22/2018   GLUCOSE 139 (H) 10/22/2018   BUN 21 10/22/2018   CREATININE 0.69 10/22/2018   BILITOT 0.5 10/22/2018   ALKPHOS 63 10/22/2018   AST 18 10/22/2018   ALT 29 10/22/2018   PROT 7.1 10/22/2018   ALBUMIN 4.4 10/22/2018   CALCIUM 9.6 10/22/2018   GFR 85.32 10/22/2018   Lab Results  Component Value Date   CHOL 214 (H) 10/22/2018   Lab Results  Component Value Date   HDL 48.60 10/22/2018   Lab Results  Component Value Date   LDLCALC 147 (H) 10/22/2018   Lab Results  Component Value Date   TRIG 91.0 10/22/2018   Lab Results  Component Value Date   CHOLHDL 4 10/22/2018   Lab Results  Component Value Date   HGBA1C 6.4 10/22/2018      Assessment & Plan:   Problem List Items Addressed This Visit      Other   COVID-19 virus infection - Primary   Relevant Medications   predniSONE (DELTASONE) 10 MG tablet      Meds ordered this encounter  Medications  . predniSONE (DELTASONE) 10 MG tablet    Sig: Take 1 tablet (10 mg total) by mouth 2 (two) times daily with a meal for 5 days. To start after completion of initial course of the 20mg  pill.    Dispense:  10 tablet    Refill:  0    Follow-up: Return if symptoms worsen or fail to improve.    Believe that it is reasonable  to put her on another 5-day course of prednisone at lower dose of 10 mg twice daily.  She will follow-up with me within the next week or 2 if she does not continue to improve. Virtual Visit via Video Note  I connected with [name] on 12/12/18 at  3:30 PM EDT by a video enabled telemedicine application and verified that I am speaking with the correct person using two identifiers.  Location: Patient:  Home Provider:    I discussed the limitations of evaluation and management by telemedicine and the availability of in person appointments. The patient expressed understanding and agreed to proceed.  History of Present Illness:     Observations/Objective:   Assessment and Plan:   Follow Up Instructions:    I discussed the assessment and treatment plan with the patient. The patient was provided an opportunity to ask questions and all were answered. The patient agreed with the plan and demonstrated an understanding of the instructions.   The patient was advised to call back or seek an in-person evaluation if the symptoms worsen or if the condition fails to improve as anticipated.  I provided 20 minutes of non-face-to-face time during this encounter.   [name], MD

## 2018-12-15 ENCOUNTER — Encounter: Payer: Self-pay | Admitting: Family Medicine

## 2018-12-15 NOTE — Telephone Encounter (Signed)
MW FYI:   Dear Dr Melvyn Novas  Wanted to check in and let you know things are going fairly well here. My O2 sats have been holding steady at 94-97  My temperature stays between 98-100 while on Motrin. I have no obvious chest congestion or cough. I have been in constant contact with my primary Dr Ethelene Hal, he has extended my prednisone seven more days at 20 per day. Mostly I am just really tired and looking forward to feeling better as next week progresses.  Thank you for you concern  Alejandra Hess

## 2018-12-19 ENCOUNTER — Encounter: Payer: Self-pay | Admitting: Family Medicine

## 2019-01-09 ENCOUNTER — Encounter: Payer: Self-pay | Admitting: Family Medicine

## 2019-01-14 ENCOUNTER — Telehealth: Payer: Self-pay | Admitting: Family Medicine

## 2019-01-14 ENCOUNTER — Telehealth: Payer: Self-pay

## 2019-01-14 NOTE — Telephone Encounter (Signed)
Spoke with patient about bill concern. Sent a message to [facility] to look into bill and to get claim resubmitted off.

## 2019-01-14 NOTE — Telephone Encounter (Signed)
Copied from [place] [id number]. Topic: General - Other >> Jan 13, 2019 12:33 PM Sheran Luz wrote: Patient requesting to speak with PA regarding a bill. See MyChart message from 11/13. >> Jan 14, 2019  9:40 AM Rayann Heman wrote: Pt calling back and would like a call back from the office manager. Pt states that a office visit was coded incorrectly. Pt would like a call back as soon as possible because she has a bill for 325. Pt states that this is pass due and she has been working on this for a month now. Please advise

## 2019-01-15 NOTE — Addendum Note (Signed)
Addended by: Jon Billings on: 01/15/2019 04:37 PM   Modules accepted: Level of Service

## 2019-01-19 ENCOUNTER — Other Ambulatory Visit: Payer: Self-pay

## 2019-01-20 ENCOUNTER — Ambulatory Visit (INDEPENDENT_AMBULATORY_CARE_PROVIDER_SITE_OTHER): Payer: Medicare Other | Admitting: Family Medicine

## 2019-01-20 ENCOUNTER — Encounter: Payer: Self-pay | Admitting: Family Medicine

## 2019-01-20 VITALS — BP 120/70 | HR 80 | Temp 96.8°F | Ht 63.5 in | Wt 165.0 lb

## 2019-01-20 DIAGNOSIS — U071 COVID-19: Secondary | ICD-10-CM | POA: Diagnosis not present

## 2019-01-20 DIAGNOSIS — R5382 Chronic fatigue, unspecified: Secondary | ICD-10-CM

## 2019-01-20 DIAGNOSIS — E119 Type 2 diabetes mellitus without complications: Secondary | ICD-10-CM | POA: Diagnosis not present

## 2019-01-20 DIAGNOSIS — E7800 Pure hypercholesterolemia, unspecified: Secondary | ICD-10-CM | POA: Diagnosis not present

## 2019-01-20 DIAGNOSIS — R232 Flushing: Secondary | ICD-10-CM | POA: Insufficient documentation

## 2019-01-20 DIAGNOSIS — R5383 Other fatigue: Secondary | ICD-10-CM | POA: Insufficient documentation

## 2019-01-20 LAB — COMPREHENSIVE METABOLIC PANEL
ALT: 31 U/L (ref 0–35)
AST: 20 U/L (ref 0–37)
Albumin: 3.9 g/dL (ref 3.5–5.2)
Alkaline Phosphatase: 77 U/L (ref 39–117)
BUN: 18 mg/dL (ref 6–23)
CO2: 29 mEq/L (ref 19–32)
Calcium: 9.4 mg/dL (ref 8.4–10.5)
Chloride: 105 mEq/L (ref 96–112)
Creatinine, Ser: 0.65 mg/dL (ref 0.40–1.20)
GFR: 91.34 mL/min (ref >60.00)
Glucose, Bld: 135 mg/dL — ABNORMAL HIGH (ref 70–99)
Potassium: 4.2 mEq/L (ref 3.5–5.1)
Sodium: 139 mEq/L (ref 135–145)
Total Bilirubin: 0.5 mg/dL (ref 0.2–1.2)
Total Protein: 6.9 g/dL (ref 6.0–8.3)

## 2019-01-20 LAB — CBC
HCT: 40.5 % (ref 36.0–46.0)
Hemoglobin: 13.5 g/dL (ref 12.0–15.0)
MCHC: 33.3 g/dL (ref 30.0–36.0)
MCV: 93.4 fl (ref 78.0–100.0)
Platelets: 280 10*3/uL (ref 150.0–400.0)
RBC: 4.34 Mil/uL (ref 3.87–5.11)
RDW: 13.5 % (ref 11.5–15.5)
WBC: 5.8 10*3/uL (ref 4.0–10.5)

## 2019-01-20 LAB — URINALYSIS, ROUTINE W REFLEX MICROSCOPIC
Bilirubin Urine: NEGATIVE
Hgb urine dipstick: NEGATIVE
Ketones, ur: NEGATIVE
Leukocytes,Ua: NEGATIVE
Nitrite: NEGATIVE
RBC / HPF: NONE SEEN (ref 0–?)
Specific Gravity, Urine: 1.01 (ref 1.000–1.030)
Total Protein, Urine: NEGATIVE
Urine Glucose: NEGATIVE
Urobilinogen, UA: 0.2 (ref 0.0–1.0)
WBC, UA: NONE SEEN (ref 0–?)
pH: 7.5 (ref 5.0–8.0)

## 2019-01-20 LAB — LDL CHOLESTEROL, DIRECT: Direct LDL: 108 mg/dL

## 2019-01-20 LAB — LIPID PANEL
Cholesterol: 169 mg/dL (ref 0–200)
HDL: 53.2 mg/dL (ref >39.00)
LDL Cholesterol: 96 mg/dL (ref 0–99)
NonHDL: 115.89
Total CHOL/HDL Ratio: 3
Triglycerides: 100 mg/dL (ref 0.0–149.0)
VLDL: 20 mg/dL (ref 0.0–40.0)

## 2019-01-20 LAB — MICROALBUMIN / CREATININE URINE RATIO
Creatinine,U: 20.9 mg/dL
Microalb Creat Ratio: 3.4 mg/g (ref 0.0–30.0)
Microalb, Ur: <0.7 mg/dL (ref 0.0–1.9)

## 2019-01-20 LAB — HEMOGLOBIN A1C: Hgb A1c MFr Bld: 7.1 % — ABNORMAL HIGH (ref 4.6–6.5)

## 2019-01-20 LAB — TSH: TSH: 1.53 u[IU]/mL (ref 0.35–4.50)

## 2019-01-20 NOTE — Progress Notes (Addendum)
Established Patient Office Visit  Subjective:  Patient ID: Alejandra Hess, female    DOB: 09/13/1955  Age: 63 y.o. MRN: [id number]  CC:  Chief Complaint  Patient presents with  . Follow-up    HPI [name] presents for follow-up of her diabetes, elevated cholesterol, and Covid.  Diabetes has been doing well but she feels as though the increasing Glucophage to twice daily has led to hot flashes and some fatigue.  She is doing all right with the simvastatin taking it at night.  She is having no side effects from that.  Has recovered from Covid well but has some lingering fatigue.  Was able to get a flu shot this year.  Has not been able to see the dentist.  Scheduled for colonoscopy that was canceled due to her Covid illness.  She is seeing the eye doctor regularly for elevated intraocular pressures.  Past Medical History:  Diagnosis Date  . Asthma   . DM (diabetes mellitus) ([place])   . GERD (gastroesophageal reflux disease)   . Heart murmur   . HLD (hyperlipidemia)     Past Surgical History:  Procedure Laterality Date  . ABDOMINAL HYSTERECTOMY    . AUGMENTATION MAMMAPLASTY Bilateral    @ 43 lift with implants  . BREAST EXCISIONAL BIOPSY Right    @ 53  . BREAST EXCISIONAL BIOPSY Left    @43 ?  . CHOLECYSTECTOMY      Family History  Problem Relation Age of Onset  . Hearing loss Mother   . Hyperlipidemia Mother   . Hypertension Mother   . Diabetes Mother   . Heart attack Father   . Heart disease Father   . Hyperlipidemia Father   . Hypertension Father   . Melanoma Father   . Asthma Sister   . Hyperlipidemia Sister   . Hypertension Sister   . Hyperlipidemia Sister   . Breast cancer Sister 67  . Asthma Sister   . Depression Sister   . Heart attack Sister   . Heart disease Sister   . Hypertension Sister   . Diabetes Sister   . Breast cancer Maternal Aunt        over 58  . Breast cancer Paternal Aunt        over 51  . Breast cancer Paternal Grandmother       over 25   . Breast cancer Maternal Aunt        over 109   . Colon cancer Neg Hx   . Esophageal cancer Neg Hx   . Inflammatory bowel disease Neg Hx   . Liver disease Neg Hx   . Pancreatic cancer Neg Hx   . Rectal cancer Neg Hx   . Stomach cancer Neg Hx     Social History   Socioeconomic History  . Marital status: Married    Spouse name: Not on file  . Number of children: 2  . Years of education: Not on file  . Highest education level: Not on file  Occupational History  . Occupation: Therapist, sports  Social Needs  . Financial resource strain: Not on file  . Food insecurity    Worry: Not on file    Inability: Not on file  . Transportation needs    Medical: Not on file    Non-medical: Not on file  Tobacco Use  . Smoking status: Former Smoker    Types: Cigarettes    Quit date: 2013    Years since quitting: 7.9  . Smokeless tobacco:  Never Used  Substance and Sexual Activity  . Alcohol use: Yes    Comment: 2 glasses of red wine daily  . Drug use: Never  . Sexual activity: Yes    Partners: Male    Comment: 1st intercourse- 79, partners- 5-, MARRIED- 76  Lifestyle  . Physical activity    Days per week: Not on file    Minutes per session: Not on file  . Stress: Not on file  Relationships  . Social Herbalist on phone: Not on file    Gets together: Not on file    Attends religious service: Not on file    Active member of club or organization: Not on file    Attends meetings of clubs or organizations: Not on file    Relationship status: Not on file  . Intimate partner violence    Fear of current or ex partner: Not on file    Emotionally abused: Not on file    Physically abused: Not on file    Forced sexual activity: Not on file  Other Topics Concern  . Not on file  Social History Narrative  . Not on file    Outpatient Medications Prior to Visit  Medication Sig Dispense Refill  . albuterol (PROVENTIL HFA) 108 (90 Base) MCG/ACT inhaler Inhale into the lungs.     [name] albuterol (PROVENTIL) (2.5 MG/3ML) 0.083% nebulizer solution VVN TID  7  . azelastine (ASTELIN) 0.1 % nasal spray Place 2 sprays into both nostrils daily. Use in each nostril as directed 30 mL 2  . budesonide-formoterol (SYMBICORT) 160-4.5 MCG/ACT inhaler Inhale 1 puff into the lungs at bedtime as needed. 1 Inhaler 3  . cetirizine (ZYRTEC) 10 MG tablet Take 10 mg by mouth daily.    . Fluocinolone Acetonide 0.01 % OIL Place 4 drops into both ears daily as needed. 20 mL 2  . fluticasone (FLONASE) 50 MCG/ACT nasal spray Place 2 sprays into both nostrils daily.    . magnesium oxide (MAG-OX) 400 MG tablet Take 400 mg by mouth 2 (two) times daily.     [name] MELATONIN PO Take 5 mg by mouth 2 (two) times daily.     . metFORMIN (GLUCOPHAGE) 500 MG tablet Take 1 tablet (500 mg total) by mouth 2 (two) times daily with a meal. 180 tablet 0  . Multiple Vitamin (MULTIVITAMIN) tablet Take 1 tablet by mouth daily.    [name] omeprazole (PRILOSEC) 40 MG capsule Take 1 capsule (40 mg total) by mouth daily as needed. 90 capsule 3  . Probiotic Product (PROBIOTIC PO) Take 1 capsule by mouth 2 (two) times daily.     . simvastatin (ZOCOR) 20 MG tablet TAKE ONE TABLET BY MOUTH AT BEDTIME 90 tablet 1  . predniSONE (DELTASONE) 5 MG tablet Take 5 mg by mouth as needed.    . sucralfate (CARAFATE) 1 GM/10ML suspension Take 10 mLs (1 g total) by mouth 4 (four) times daily -  with meals and at bedtime. 420 mL 1   No facility-administered medications prior to visit.     Allergies  Allergen Reactions  . Azithromycin Rash  . Penicillins Hives and Rash    ROS Review of Systems  Constitutional: Positive for fatigue. Negative for chills, diaphoresis, fever and unexpected weight change.  HENT: Negative.   Eyes: Negative for photophobia and visual disturbance.  Respiratory: Negative.   Cardiovascular: Negative.   Gastrointestinal: Negative.   Endocrine: Negative for polyphagia and polyuria.  Genitourinary: Negative.  Musculoskeletal: Negative for gait problem and joint swelling.  Skin: Negative for pallor and rash.  Allergic/Immunologic: Negative for immunocompromised state.  Neurological: Negative for light-headedness and headaches.  Hematological: Does not bruise/bleed easily.  Psychiatric/Behavioral: Negative.       Objective:    Physical Exam  Constitutional: She is oriented to person, place, and time. She appears well-developed and well-nourished. No distress.  HENT:  Head: Normocephalic and atraumatic.  Right Ear: External ear normal.  Left Ear: External ear normal.  Mouth/Throat: Oropharynx is clear and moist. No oropharyngeal exudate.  Eyes: Pupils are equal, round, and reactive to light. Conjunctivae are normal. Right eye exhibits no discharge. Left eye exhibits no discharge. No scleral icterus.  Neck: No JVD present. No tracheal deviation present. No thyromegaly present.  Cardiovascular: Normal rate, regular rhythm and normal heart sounds.  Pulmonary/Chest: Effort normal and breath sounds normal. No stridor.  Abdominal: Bowel sounds are normal.  Musculoskeletal:        General: No edema.  Lymphadenopathy:    She has no cervical adenopathy.  Neurological: She is alert and oriented to person, place, and time.  Skin: Skin is warm and dry. She is not diaphoretic.  Psychiatric: She has a normal mood and affect. Her behavior is normal.    BP 120/70   Pulse 80   Temp (!) 96.8 F (36 C) (Tympanic)   Ht 5' 3.5" (1.613 m)   Wt 165 lb (74.8 kg)   SpO2 99%   BMI 28.77 kg/m  Wt Readings from Last 3 Encounters:  01/20/19 165 lb (74.8 kg)  11/18/18 160 lb 4 oz (72.7 kg)  10/24/18 153 lb (69.4 kg)   BP Readings from Last 3 Encounters:  01/20/19 120/70  11/18/18 136/78  10/24/18 130/84   Guideline developer:  UpToDate (see UpToDate for funding source) Date Released: June 2014  There are no preventive care reminders to display for this patient.  There are no preventive care  reminders to display for this patient.  Lab Results  Component Value Date   TSH 1.53 01/20/2019   Lab Results  Component Value Date   WBC 5.8 01/20/2019   HGB 13.5 01/20/2019   HCT 40.5 01/20/2019   MCV 93.4 01/20/2019   PLT 280.0 01/20/2019   Lab Results  Component Value Date   NA 139 01/20/2019   K 4.2 01/20/2019   CO2 29 01/20/2019   GLUCOSE 135 (H) 01/20/2019   BUN 18 01/20/2019   CREATININE 0.65 01/20/2019   BILITOT 0.5 01/20/2019   ALKPHOS 77 01/20/2019   AST 20 01/20/2019   ALT 31 01/20/2019   PROT 6.9 01/20/2019   ALBUMIN 3.9 01/20/2019   CALCIUM 9.4 01/20/2019   GFR 91.34 01/20/2019   Lab Results  Component Value Date   CHOL 169 01/20/2019   Lab Results  Component Value Date   HDL 53.20 01/20/2019   Lab Results  Component Value Date   LDLCALC 96 01/20/2019   Lab Results  Component Value Date   TRIG 100.0 01/20/2019   Lab Results  Component Value Date   CHOLHDL 3 01/20/2019   Lab Results  Component Value Date   HGBA1C 7.1 (H) 01/20/2019      Assessment & Plan:   Problem List Items Addressed This Visit      Endocrine   Controlled type 2 diabetes mellitus without complication, without long-term current use of insulin (HCC) - Primary   Relevant Medications   dapagliflozin propanediol (FARXIGA) 10 MG TABS tablet  Other Relevant Orders   CBC (Completed)   Comp Met (CMET) (Completed)   HgB A1c (Completed)   Urine Microalbumin w/creat. ratio (Completed)   Urinalysis, Routine w reflex microscopic (Completed)     Other   Elevated LDL cholesterol level   Relevant Orders   Comp Met (CMET) (Completed)   Direct LDL (Completed)   Lipid Profile (Completed)   COVID-19 virus infection   Relevant Orders   SAR CoV2 Serology (COVID 19)AB(IGG)IA   Chronic fatigue   Relevant Orders   TSH (Completed)      Meds ordered this encounter  Medications  . dapagliflozin propanediol (FARXIGA) 10 MG TABS tablet    Sig: Take 10 mg by mouth daily before  breakfast.    Dispense:  30 tablet    Refill:  2    Follow-up: Return in about 3 months (around 04/22/2019), or return in one month for IgG for Covid.[name]

## 2019-01-22 ENCOUNTER — Other Ambulatory Visit: Payer: Self-pay | Admitting: Family Medicine

## 2019-01-22 DIAGNOSIS — E7800 Pure hypercholesterolemia, unspecified: Secondary | ICD-10-CM

## 2019-01-26 MED ORDER — DAPAGLIFLOZIN PROPANEDIOL 10 MG PO TABS: 10.0000 mg | ORAL_TABLET | Freq: Every day | ORAL | 2 refills | Status: DC

## 2019-01-26 NOTE — Addendum Note (Signed)
Addended by: Jon Billings on: 01/26/2019 04:57 PM   Modules accepted: Orders

## 2019-01-28 ENCOUNTER — Encounter: Payer: Self-pay | Admitting: Family Medicine

## 2019-02-17 ENCOUNTER — Encounter: Payer: Self-pay | Admitting: Family Medicine

## 2019-02-17 DIAGNOSIS — M62838 Other muscle spasm: Secondary | ICD-10-CM

## 2019-02-24 MED ORDER — METHOCARBAMOL 500 MG PO TABS: ORAL_TABLET | ORAL | 0 refills | Status: DC

## 2019-03-04 ENCOUNTER — Encounter: Payer: Self-pay | Admitting: Family Medicine

## 2019-03-06 ENCOUNTER — Other Ambulatory Visit: Payer: Self-pay

## 2019-03-06 DIAGNOSIS — E7800 Pure hypercholesterolemia, unspecified: Secondary | ICD-10-CM

## 2019-03-06 DIAGNOSIS — E119 Type 2 diabetes mellitus without complications: Secondary | ICD-10-CM

## 2019-03-06 MED ORDER — OMEPRAZOLE 40 MG PO CPDR: 40.0000 mg | DELAYED_RELEASE_CAPSULE | Freq: Every day | ORAL | 0 refills | Status: DC | PRN

## 2019-03-06 MED ORDER — SIMVASTATIN 20 MG PO TABS: 20.0000 mg | ORAL_TABLET | Freq: Every day | ORAL | 0 refills | Status: DC

## 2019-03-06 MED ORDER — METFORMIN HCL 500 MG PO TABS: 500.0000 mg | ORAL_TABLET | Freq: Two times a day (BID) | ORAL | 0 refills | Status: DC

## 2019-03-19 ENCOUNTER — Ambulatory Visit: Payer: Medicare Other

## 2019-03-28 ENCOUNTER — Ambulatory Visit: Payer: Medicare Other

## 2019-04-02 ENCOUNTER — Other Ambulatory Visit: Payer: Self-pay

## 2019-04-03 ENCOUNTER — Ambulatory Visit (INDEPENDENT_AMBULATORY_CARE_PROVIDER_SITE_OTHER): Payer: Medicare Other

## 2019-04-03 ENCOUNTER — Encounter: Payer: Self-pay | Admitting: Family Medicine

## 2019-04-03 ENCOUNTER — Ambulatory Visit (INDEPENDENT_AMBULATORY_CARE_PROVIDER_SITE_OTHER): Payer: Medicare Other | Admitting: Family Medicine

## 2019-04-03 VITALS — BP 118/78 | HR 82 | Temp 95.8°F | Ht 63.0 in | Wt 164.2 lb

## 2019-04-03 DIAGNOSIS — Z8616 Personal history of COVID-19: Secondary | ICD-10-CM

## 2019-04-03 DIAGNOSIS — U071 COVID-19: Secondary | ICD-10-CM | POA: Diagnosis not present

## 2019-04-03 DIAGNOSIS — J300 Vasomotor rhinitis: Secondary | ICD-10-CM | POA: Diagnosis not present

## 2019-04-03 DIAGNOSIS — B002 Herpesviral gingivostomatitis and pharyngotonsillitis: Secondary | ICD-10-CM

## 2019-04-03 DIAGNOSIS — E7800 Pure hypercholesterolemia, unspecified: Secondary | ICD-10-CM | POA: Diagnosis not present

## 2019-04-03 DIAGNOSIS — K219 Gastro-esophageal reflux disease without esophagitis: Secondary | ICD-10-CM | POA: Diagnosis not present

## 2019-04-03 DIAGNOSIS — E119 Type 2 diabetes mellitus without complications: Secondary | ICD-10-CM | POA: Diagnosis not present

## 2019-04-03 MED ORDER — VALACYCLOVIR HCL 1 G PO TABS: 500.0000 mg | ORAL_TABLET | Freq: Two times a day (BID) | ORAL | 2 refills | Status: AC

## 2019-04-03 MED ORDER — AZELASTINE HCL 0.1 % NA SOLN: 2.0000 | Freq: Every day | NASAL | 2 refills | Status: DC

## 2019-04-03 MED ORDER — METFORMIN HCL 500 MG PO TABS: 500.0000 mg | ORAL_TABLET | Freq: Two times a day (BID) | ORAL | 1 refills | Status: DC

## 2019-04-03 MED ORDER — OMEPRAZOLE 40 MG PO CPDR: 40.0000 mg | DELAYED_RELEASE_CAPSULE | Freq: Every day | ORAL | 1 refills | Status: DC | PRN

## 2019-04-03 NOTE — Addendum Note (Signed)
Addended by: Lynnea Ferrier on: 04/03/2019 03:49 PM   Modules accepted: Orders

## 2019-04-03 NOTE — Progress Notes (Addendum)
Established Patient Office Visit  Subjective:  Patient ID: Alejandra Hess, female    DOB: 25-Jan-1956  Age: 64 y.o. MRN: [id number]  CC:  Chief Complaint  Patient presents with  . Follow-up    follow up on diabetes and covid     HPI Alejandra Hess presents for follow-up of her Covid infection.  Doing much better.  Energy levels are back to normal.  Breathing well.  Going for her Covid vaccination tomorrow.  Would like to have her antibodies checked.   Never started [place] secondary to cost.  She is taking the Glucophage only.  She had only recently started the Zocor when we checked her LDL cholesterol last time.  She has now been taking the medication for 3 months.   Past Medical History:  Diagnosis Date  . Asthma   . DM (diabetes mellitus) ([place])   . GERD (gastroesophageal reflux disease)   . Heart murmur   . HLD (hyperlipidemia)     Past Surgical History:  Procedure Laterality Date  . ABDOMINAL HYSTERECTOMY    . AUGMENTATION MAMMAPLASTY Bilateral    @ 43 lift with implants  . BREAST EXCISIONAL BIOPSY Right    @ 53  . BREAST EXCISIONAL BIOPSY Left    @43 ?  . CHOLECYSTECTOMY      Family History  Problem Relation Age of Onset  . Hearing loss Mother   . Hyperlipidemia Mother   . Hypertension Mother   . Diabetes Mother   . Heart attack Father   . Heart disease Father   . Hyperlipidemia Father   . Hypertension Father   . Melanoma Father   . Asthma Sister   . Hyperlipidemia Sister   . Hypertension Sister   . Hyperlipidemia Sister   . Breast cancer Sister 37  . Asthma Sister   . Depression Sister   . Heart attack Sister   . Heart disease Sister   . Hypertension Sister   . Diabetes Sister   . Breast cancer Maternal Aunt        over 37  . Breast cancer Paternal Aunt        over 87  . Breast cancer Paternal Grandmother        over 75   . Breast cancer Maternal Aunt        over 52   . Colon cancer Neg Hx   . Esophageal cancer Neg Hx   . Inflammatory bowel  disease Neg Hx   . Liver disease Neg Hx   . Pancreatic cancer Neg Hx   . Rectal cancer Neg Hx   . Stomach cancer Neg Hx     Social History   Socioeconomic History  . Marital status: Married    Spouse name: Not on file  . Number of children: 2  . Years of education: Not on file  . Highest education level: Not on file  Occupational History  . Occupation: Therapist, sports  Tobacco Use  . Smoking status: Former Smoker    Types: Cigarettes    Quit date: 2013    Years since quitting: 8.1  . Smokeless tobacco: Never Used  Substance and Sexual Activity  . Alcohol use: Yes    Comment: 2 glasses of red wine daily  . Drug use: Never  . Sexual activity: Yes    Partners: Male    Comment: 1st intercourse- 48, partners- 5-, MARRIED- 18  Other Topics Concern  . Not on file  Social History Narrative  . Not on file  Social Determinants of Health   Financial Resource Strain:   . Difficulty of Paying Living Expenses: Not on file  Food Insecurity:   . Worried About Charity fundraiser in the Last Year: Not on file  . Ran Out of Food in the Last Year: Not on file  Transportation Needs:   . Lack of Transportation (Medical): Not on file  . Lack of Transportation (Non-Medical): Not on file  Physical Activity:   . Days of Exercise per Week: Not on file  . Minutes of Exercise per Session: Not on file  Stress:   . Feeling of Stress : Not on file  Social Connections:   . Frequency of Communication with Friends and Family: Not on file  . Frequency of Social Gatherings with Friends and Family: Not on file  . Attends Religious Services: Not on file  . Active Member of Clubs or Organizations: Not on file  . Attends Archivist Meetings: Not on file  . Marital Status: Not on file  Intimate Partner Violence:   . Fear of Current or Ex-Partner: Not on file  . Emotionally Abused: Not on file  . Physically Abused: Not on file  . Sexually Abused: Not on file    Outpatient Medications Prior to  Visit  Medication Sig Dispense Refill  . albuterol (PROVENTIL HFA) 108 (90 Base) MCG/ACT inhaler Inhale into the lungs.    [name] albuterol (PROVENTIL) (2.5 MG/3ML) 0.083% nebulizer solution VVN TID  7  . budesonide-formoterol (SYMBICORT) 160-4.5 MCG/ACT inhaler Inhale 1 puff into the lungs at bedtime as needed. 1 Inhaler 3  . cetirizine (ZYRTEC) 10 MG tablet Take 10 mg by mouth daily.    . Fluocinolone Acetonide 0.01 % OIL Place 4 drops into both ears daily as needed. 20 mL 2  . fluticasone (FLONASE) 50 MCG/ACT nasal spray Place 2 sprays into both nostrils daily.    . magnesium oxide (MAG-OX) 400 MG tablet Take 400 mg by mouth 2 (two) times daily.     [name] MELATONIN PO Take 5 mg by mouth 2 (two) times daily.     . Multiple Vitamin (MULTIVITAMIN) tablet Take 1 tablet by mouth daily.    . Probiotic Product (PROBIOTIC PO) Take 1 capsule by mouth 2 (two) times daily.     [name] azelastine (ASTELIN) 0.1 % nasal spray Place 2 sprays into both nostrils daily. Use in each nostril as directed 30 mL 2  . metFORMIN (GLUCOPHAGE) 500 MG tablet Take 1 tablet (500 mg total) by mouth 2 (two) times daily with a meal. 60 tablet 0  . omeprazole (PRILOSEC) 40 MG capsule Take 1 capsule (40 mg total) by mouth daily as needed. 30 capsule 0  . simvastatin (ZOCOR) 20 MG tablet Take 1 tablet (20 mg total) by mouth at bedtime. 30 tablet 0  . dapagliflozin propanediol (FARXIGA) 10 MG TABS tablet Take 10 mg by mouth daily before breakfast. 30 tablet 2  . methocarbamol (ROBAXIN) 500 MG tablet May take one at night as needed for spasms. 10 tablet 0   No facility-administered medications prior to visit.    Allergies  Allergen Reactions  . Azithromycin Rash  . Penicillins Hives and Rash    ROS Review of Systems  Constitutional: Negative.   HENT: Negative.   Eyes: Negative for photophobia and visual disturbance.  Respiratory: Negative.   Cardiovascular: Negative.   Gastrointestinal: Negative.   Endocrine: Negative for  polyphagia and polyuria.  Genitourinary: Negative.   Musculoskeletal: Negative for gait problem  and joint swelling.  Skin: Negative for pallor and rash.  Allergic/Immunologic: Negative for immunocompromised state.  Neurological: Negative for light-headedness and numbness.  Hematological: Does not bruise/bleed easily.  Psychiatric/Behavioral: Negative.       Objective:    Physical Exam  Constitutional: She is oriented to person, place, and time. She appears well-developed and well-nourished. No distress.  HENT:  Head: Normocephalic and atraumatic.  Right Ear: External ear normal.  Left Ear: External ear normal.  Eyes: Conjunctivae are normal. Right eye exhibits no discharge. Left eye exhibits no discharge. No scleral icterus.  Neck: No JVD present. No tracheal deviation present. No thyromegaly present.  Cardiovascular: Normal rate, regular rhythm and normal heart sounds.  Pulmonary/Chest: Effort normal and breath sounds normal. No stridor.  Abdominal: Bowel sounds are normal.  Musculoskeletal:        General: No edema.  Lymphadenopathy:    She has no cervical adenopathy.  Neurological: She is alert and oriented to person, place, and time.  Skin: Skin is warm and dry. She is not diaphoretic.  Psychiatric: She has a normal mood and affect. Her behavior is normal.    BP 118/78   Pulse 82   Temp (!) 95.8 F (35.4 C) (Tympanic)   Ht 5\' 3"  (1.6 m)   Wt 164 lb 3.2 oz (74.5 kg)   SpO2 95%   BMI 29.09 kg/m  Wt Readings from Last 3 Encounters:  04/03/19 164 lb 3.2 oz (74.5 kg)  01/20/19 165 lb (74.8 kg)  11/18/18 160 lb 4 oz (72.7 kg)     Health Maintenance Due  Topic Date Due  . TETANUS/TDAP  07/27/1972    There are no preventive care reminders to display for this patient.  Lab Results  Component Value Date   TSH 1.53 01/20/2019   Lab Results  Component Value Date   WBC 5.8 01/20/2019   HGB 13.5 01/20/2019   HCT 40.5 01/20/2019   MCV 93.4 01/20/2019   PLT 280.0  01/20/2019   Lab Results  Component Value Date   NA 141 04/13/2019   K 4.2 04/13/2019   CO2 29 04/13/2019   GLUCOSE 116 (H) 04/13/2019   BUN 21 04/13/2019   CREATININE 0.71 04/13/2019   BILITOT 0.5 01/20/2019   ALKPHOS 77 01/20/2019   AST 20 01/20/2019   ALT 31 01/20/2019   PROT 6.9 01/20/2019   ALBUMIN 3.9 01/20/2019   CALCIUM 9.9 04/13/2019   GFR 82.43 04/13/2019   Lab Results  Component Value Date   CHOL 160 04/13/2019   Lab Results  Component Value Date   HDL 43.80 04/13/2019   Lab Results  Component Value Date   LDLCALC 83 04/13/2019   Lab Results  Component Value Date   TRIG 167.0 (H) 04/13/2019   Lab Results  Component Value Date   CHOLHDL 4 04/13/2019   Lab Results  Component Value Date   HGBA1C 6.7 (H) 04/13/2019      Assessment & Plan:   Problem List Items Addressed This Visit      Respiratory   Vasomotor rhinitis   Relevant Medications   azelastine (ASTELIN) 0.1 % nasal spray     Digestive   Gastroesophageal reflux disease   Relevant Medications   omeprazole (PRILOSEC) 40 MG capsule   Oral herpes     Endocrine   Controlled type 2 diabetes mellitus without complication, without long-term current use of insulin (HCC)   Relevant Medications   metFORMIN (GLUCOPHAGE) 500 MG tablet   simvastatin (ZOCOR)  40 MG tablet   Other Relevant Orders   Basic metabolic panel (Completed)   Hemoglobin A1c (Completed)     Other   Elevated LDL cholesterol level - Primary   Relevant Medications   simvastatin (ZOCOR) 40 MG tablet   Other Relevant Orders   Lipid panel (Completed)   History of COVID-19   Relevant Orders   SAR CoV2 Serology (COVID 19)AB(IGG)IA (Completed)   DG Chest 2 View (Completed)      Meds ordered this encounter  Medications  . valACYclovir (VALTREX) 1000 MG tablet    Sig: Take 0.5 tablets (500 mg total) by mouth 2 (two) times daily for 3 days. To use as needed for cold sores.    Dispense:  9 tablet    Refill:  2  .  azelastine (ASTELIN) 0.1 % nasal spray    Sig: Place 2 sprays into both nostrils daily. Use in each nostril as directed    Dispense:  30 mL    Refill:  2  . metFORMIN (GLUCOPHAGE) 500 MG tablet    Sig: Take 1 tablet (500 mg total) by mouth 2 (two) times daily with a meal.    Dispense:  180 tablet    Refill:  1  . omeprazole (PRILOSEC) 40 MG capsule    Sig: Take 1 capsule (40 mg total) by mouth daily as needed.    Dispense:  90 capsule    Refill:  1  . simvastatin (ZOCOR) 40 MG tablet    Sig: Take 1 tablet (40 mg total) by mouth at bedtime.    Dispense:  90 tablet    Refill:  3    Follow-up: Return in about 4 months (around 08/01/2019).   Will return at the end of the month for her hemoglobin A1c and lipid profile.  We will adjust Metformin and Zocor accordingly. [name], MD

## 2019-04-04 ENCOUNTER — Encounter: Payer: Self-pay | Admitting: Family Medicine

## 2019-04-04 ENCOUNTER — Ambulatory Visit: Payer: Medicare Other | Attending: Internal Medicine

## 2019-04-04 DIAGNOSIS — Z23 Encounter for immunization: Secondary | ICD-10-CM | POA: Insufficient documentation

## 2019-04-04 LAB — SAR COV2 SEROLOGY (COVID19)AB(IGG),IA: SARS CoV2 AB IGG: POSITIVE — AB

## 2019-04-04 NOTE — Progress Notes (Signed)
   Covid-19 Vaccination Clinic  Name:  Alejandra Hess    MRN: [id number] DOB: Aug 02, 1955  04/04/2019  Alejandra Hess was observed post Covid-19 immunization for 15 minutes without incidence. She was provided with Vaccine Information Sheet and instruction to access the V-Safe system.   Alejandra Hess was instructed to call 911 with any severe reactions post vaccine: [name] Difficulty breathing  . Swelling of your face and throat  . A fast heartbeat  . A bad rash all over your body  . Dizziness and weakness    Immunizations Administered    Name Date Dose VIS Date Route   Pfizer COVID-19 Vaccine 04/04/2019  5:01 PM 0.3 mL 02/06/2019 Intramuscular   Manufacturer: [place]   Lot: [id number]   [place]: [id number]

## 2019-04-06 ENCOUNTER — Encounter: Payer: Self-pay | Admitting: Family Medicine

## 2019-04-07 ENCOUNTER — Other Ambulatory Visit: Payer: Self-pay

## 2019-04-07 DIAGNOSIS — E7800 Pure hypercholesterolemia, unspecified: Secondary | ICD-10-CM

## 2019-04-07 MED ORDER — SIMVASTATIN 20 MG PO TABS: 20.0000 mg | ORAL_TABLET | Freq: Every day | ORAL | 0 refills | Status: DC

## 2019-04-07 NOTE — Telephone Encounter (Signed)
Patient is returning the call. CB is [phone]

## 2019-04-07 NOTE — Telephone Encounter (Signed)
Spoke with patient to inform her that Dr. Ethelene Hal would like for her to come in at anytime to have fasting blood work to check lipid profile. Per patient she thought he wanted her to wait until the end of this month before she has blood work. Appointment offered but pt states that she is out of town at this time and will not be back until Monday. Pt aware that Rx will be sent to requested pharmacy at [facility] appointment scheduled for pt to come in on Monday. Pt states that she will keep this appointment.

## 2019-04-08 ENCOUNTER — Ambulatory Visit: Payer: Medicare Other

## 2019-04-13 ENCOUNTER — Other Ambulatory Visit (INDEPENDENT_AMBULATORY_CARE_PROVIDER_SITE_OTHER): Payer: Medicare Other

## 2019-04-13 ENCOUNTER — Other Ambulatory Visit: Payer: Self-pay

## 2019-04-13 DIAGNOSIS — E7800 Pure hypercholesterolemia, unspecified: Secondary | ICD-10-CM | POA: Diagnosis not present

## 2019-04-13 DIAGNOSIS — E119 Type 2 diabetes mellitus without complications: Secondary | ICD-10-CM | POA: Diagnosis not present

## 2019-04-13 LAB — BASIC METABOLIC PANEL
BUN: 21 mg/dL (ref 6–23)
CO2: 29 mEq/L (ref 19–32)
Calcium: 9.9 mg/dL (ref 8.4–10.5)
Chloride: 104 mEq/L (ref 96–112)
Creatinine, Ser: 0.71 mg/dL (ref 0.40–1.20)
GFR: 82.43 mL/min (ref >60.00)
Glucose, Bld: 116 mg/dL — ABNORMAL HIGH (ref 70–99)
Potassium: 4.2 mEq/L (ref 3.5–5.1)
Sodium: 141 mEq/L (ref 135–145)

## 2019-04-13 LAB — LIPID PANEL
Cholesterol: 160 mg/dL (ref 0–200)
HDL: 43.8 mg/dL (ref >39.00)
LDL Cholesterol: 83 mg/dL (ref 0–99)
NonHDL: 116.61
Total CHOL/HDL Ratio: 4
Triglycerides: 167 mg/dL — ABNORMAL HIGH (ref 0.0–149.0)
VLDL: 33.4 mg/dL (ref 0.0–40.0)

## 2019-04-13 LAB — HEMOGLOBIN A1C: Hgb A1c MFr Bld: 6.7 % — ABNORMAL HIGH (ref 4.6–6.5)

## 2019-04-14 MED ORDER — SIMVASTATIN 40 MG PO TABS: 40.0000 mg | ORAL_TABLET | Freq: Every day | ORAL | 3 refills | Status: DC

## 2019-04-14 NOTE — Addendum Note (Signed)
Addended by: Jon Billings on: 04/14/2019 10:22 AM   Modules accepted: Orders

## 2019-04-29 ENCOUNTER — Ambulatory Visit: Payer: Medicare Other | Attending: Internal Medicine

## 2019-04-29 DIAGNOSIS — Z23 Encounter for immunization: Secondary | ICD-10-CM

## 2019-04-29 NOTE — Progress Notes (Signed)
   Covid-19 Vaccination Clinic  Name:  Alejandra Hess    MRN: [id number] DOB: 05-18-1955  04/29/2019  Alejandra Hess was observed post Covid-19 immunization for 15 minutes without incident. She was provided with Vaccine Information Sheet and instruction to access the V-Safe system.   Alejandra Hess was instructed to call 911 with any severe reactions post vaccine: [name] Difficulty breathing  . Swelling of face and throat  . A fast heartbeat  . A bad rash all over body  . Dizziness and weakness   Immunizations Administered    Name Date Dose VIS Date Route   Pfizer COVID-19 Vaccine 04/29/2019  1:26 PM 0.3 mL 02/06/2019 Intramuscular   Manufacturer: [place]   Lot: [id number]   [place]: [id number]

## 2019-05-01 ENCOUNTER — Encounter: Payer: Self-pay | Admitting: Family Medicine

## 2019-05-01 ENCOUNTER — Encounter: Payer: Self-pay | Admitting: Gastroenterology

## 2019-05-02 ENCOUNTER — Encounter: Payer: Self-pay | Admitting: Family Medicine

## 2019-05-04 ENCOUNTER — Ambulatory Visit (INDEPENDENT_AMBULATORY_CARE_PROVIDER_SITE_OTHER): Payer: Medicare Other | Admitting: Family Medicine

## 2019-05-04 ENCOUNTER — Encounter: Payer: Self-pay | Admitting: Family Medicine

## 2019-05-04 ENCOUNTER — Other Ambulatory Visit: Payer: Self-pay

## 2019-05-04 VITALS — BP 152/70 | HR 82 | Temp 96.6°F | Ht 63.0 in | Wt 163.4 lb

## 2019-05-04 DIAGNOSIS — E7800 Pure hypercholesterolemia, unspecified: Secondary | ICD-10-CM | POA: Diagnosis not present

## 2019-05-04 DIAGNOSIS — I776 Arteritis, unspecified: Secondary | ICD-10-CM | POA: Diagnosis not present

## 2019-05-04 DIAGNOSIS — L299 Pruritus, unspecified: Secondary | ICD-10-CM | POA: Diagnosis not present

## 2019-05-04 MED ORDER — HYDROXYZINE HCL 25 MG PO TABS: 25.0000 mg | ORAL_TABLET | Freq: Three times a day (TID) | ORAL | 0 refills | Status: DC | PRN

## 2019-05-04 NOTE — Patient Instructions (Signed)
Vasculitis  Vasculitis is inflammation of the blood vessels. With vasculitis, the blood vessels can become thick, narrow, scarred, or weak. Enough blood may not be able to flow through them. This can cause damage to the muscles, kidneys, lungs, brain, and other parts of the body. There are many types of vasculitis. The different types may affect different kinds of blood vessels or different areas of the body. Some types last only a short time, while others last a long time. What are the causes? The exact cause of this condition is not known. However, vasculitis can develop when the body's defense system (immune system) attacks its own blood vessels. This attack can be caused by:  An infection.  An immune system disease, such as lupus, rheumatoid arthritis, or scleroderma.  An allergic reaction to a medicine.  A cancer that affects blood cells, such as leukemia or lymphoma. What increases the risk? The following factors may make you more likely to develop this condition:  Being a smoker.  Being under stress.  Having a physical injury. What are the signs or symptoms? Symptoms of this condition depend on the type of vasculitis that you have. Symptoms that are common to all types of vasculitis include:  Fever.  Poor appetite.  Weight loss.  Feeling very tired (fatigue).  Having aches and pains.  Weakness.  Numbness in an area of your body. Symptoms for specific types of vasculitis include:  Skin problems, such as sores, spots, or rashes.  Trouble seeing.  Trouble breathing.  Coughing up blood.  Blood in your urine.  Headaches.  Stomach pain.  Stuffy or bloody nose. How is this diagnosed? This condition may be diagnosed based on:  Your symptoms.  A physical exam. You may also have tests, including:  Blood tests.  A urine test.  A biopsy of a blood vessel.  A test to measure the electrical signals moving through nerves (nerve conduction  study).  Imaging tests, such as: ? X-rays. ? CT scan. ? Ultrasound. ? MRI. ? Angiogram. How is this treated? Treatment for this condition will depend on the type of vasculitis that you have and how severe the symptoms are. Sometimes treatment is not needed. Treatment often includes:  Medicines.  Physical therapy or occupational therapy. This helps strengthen muscles that were weakened by the disease. You will need to see your health care provider while you are being treated. During follow-up visits, your health care provider may:  Perform blood tests and bone density tests.  Check your blood pressure and blood sugar.  Check for side effects of any medicines you are taking. Vasculitis cannot always be cured. Sometimes symptoms go away but the disease does not (the disease goes into remission). If symptoms return, increased treatment may be needed. Follow these instructions at home:  Take over-the-counter and prescription medicines only as told by your health care provider.  Exercise as directed. Talk with your health care provider about what exercises are okay for you to do. Exercises that increase your heart rate (aerobic exercise), such as walking, are usually recommended. Aerobic exercise helps control your blood pressure and prevent bone loss.  Follow a healthy diet. Make sure your diet includes fruits, vegetables, whole grains, and healthy sources of protein.  Learn as much as you can about vasculitis, and consider joining a support group. ? Talk to other people who have your condition. This may help you cope with the illness. ? Talk with your health care provider if you feel stressed, anxious, or   depressed.  Keep all follow-up visits as told by your health care provider. This is important. Contact a health care provider if:  Your symptoms return or you have new symptoms.  Your fever, fatigue, headache, or weight loss gets worse.  You have signs of infection, such as  redness, swelling, tenderness, warmth, or a new fever.  Your pain does not go away, even after you take pain medicine.  Your nose bleeds. Get help right away if:  Your vision gets worse.  You have chest pain or stomach pain.  You have trouble breathing.  One side of your face or body suddenly becomes weak or numb.  There is blood in your urine. Summary  Vasculitis is inflammation of the blood vessels that may cause them to become thick, narrow, scarred, or weak. Enough blood may not be able to flow through them. This can cause damage throughout your body.  The exact cause of this condition is not known. However, vasculitis can develop when the body's immune system attacks its own blood vessels. This attack may be caused by an infection, an immune system disease, an allergic reaction to a medicine, or a cancer that affects blood cells, such as leukemia or lymphoma.  Vasculitis cannot always be cured. Sometimes symptoms go away but the disease does not (the disease goes into remission). If symptoms return, increased treatment may be needed. This information is not intended to replace advice given to you by your health care provider. Make sure you discuss any questions you have with your health care provider. Document Revised: 03/15/2017 Document Reviewed: 03/05/2017 Elsevier Patient Education  2020 [employer]vier Inc.  

## 2019-05-04 NOTE — Progress Notes (Addendum)
Established Patient Office Visit  Subjective:  Patient ID: Alejandra Hess, female    DOB: 07-May-1955  Age: 64 y.o. MRN: [id number]  CC:  Chief Complaint  Patient presents with  . Rash    rash on legs x 3 days patient not sure if this is due to covid vaccine. C/O stomach pains x 3 days.     HPI Alejandra Hess presents a rash on her bilateral lower extremities from the knees down.  She has had a general body pruritus.  Patient has felt some malaise.  There is vague abdominal pain.  Symptoms started 2 days after she received her second Covid vaccine.  There has been no fever chills respiratory tract symptoms chest pain shortness of breath nausea vomiting.  Past Medical History:  Diagnosis Date  . Asthma   . DM (diabetes mellitus) ([place])   . GERD (gastroesophageal reflux disease)   . Heart murmur   . HLD (hyperlipidemia)     Past Surgical History:  Procedure Laterality Date  . ABDOMINAL HYSTERECTOMY    . AUGMENTATION MAMMAPLASTY Bilateral    @ 43 lift with implants  . BREAST EXCISIONAL BIOPSY Right    @ 53  . BREAST EXCISIONAL BIOPSY Left    @43 ?  . CHOLECYSTECTOMY      Family History  Problem Relation Age of Onset  . Hearing loss Mother   . Hyperlipidemia Mother   . Hypertension Mother   . Diabetes Mother   . Heart attack Father   . Heart disease Father   . Hyperlipidemia Father   . Hypertension Father   . Melanoma Father   . Asthma Sister   . Hyperlipidemia Sister   . Hypertension Sister   . Hyperlipidemia Sister   . Breast cancer Sister 62  . Asthma Sister   . Depression Sister   . Heart attack Sister   . Heart disease Sister   . Hypertension Sister   . Diabetes Sister   . Breast cancer Maternal Aunt        over 1  . Breast cancer Paternal Aunt        over 76  . Breast cancer Paternal Grandmother        over 20   . Breast cancer Maternal Aunt        over 64   . Colon cancer Neg Hx   . Esophageal cancer Neg Hx   . Inflammatory bowel disease Neg  Hx   . Liver disease Neg Hx   . Pancreatic cancer Neg Hx   . Rectal cancer Neg Hx   . Stomach cancer Neg Hx     Social History   Socioeconomic History  . Marital status: Married    Spouse name: Not on file  . Number of children: 2  . Years of education: Not on file  . Highest education level: Not on file  Occupational History  . Occupation: Therapist, sports  Tobacco Use  . Smoking status: Former Smoker    Types: Cigarettes    Quit date: 2013    Years since quitting: 8.2  . Smokeless tobacco: Never Used  Substance and Sexual Activity  . Alcohol use: Yes    Comment: 2 glasses of red wine daily  . Drug use: Never  . Sexual activity: Yes    Partners: Male    Comment: 1st intercourse- 66, partners- 5-, MARRIED- 72  Other Topics Concern  . Not on file  Social History Narrative  . Not on file   Social  Determinants of Health   Financial Resource Strain:   . Difficulty of Paying Living Expenses:   Food Insecurity:   . Worried About Charity fundraiser in the Last Year:   . Arboriculturist in the Last Year:   Transportation Needs:   . Film/video editor (Medical):   [name] Lack of Transportation (Non-Medical):   Physical Activity:   . Days of Exercise per Week:   . Minutes of Exercise per Session:   Stress:   . Feeling of Stress :   Social Connections:   . Frequency of Communication with Friends and Family:   . Frequency of Social Gatherings with Friends and Family:   . Attends Religious Services:   . Active Member of Clubs or Organizations:   . Attends Archivist Meetings:   [name] Marital Status:   Intimate Partner Violence:   . Fear of Current or Ex-Partner:   . Emotionally Abused:   [name] Physically Abused:   . Sexually Abused:     Outpatient Medications Prior to Visit  Medication Sig Dispense Refill  . albuterol (PROVENTIL HFA) 108 (90 Base) MCG/ACT inhaler Inhale into the lungs.    [name] albuterol (PROVENTIL) (2.5 MG/3ML) 0.083% nebulizer solution VVN TID  7  .  azelastine (ASTELIN) 0.1 % nasal spray Place 2 sprays into both nostrils daily. Use in each nostril as directed 30 mL 2  . budesonide-formoterol (SYMBICORT) 160-4.5 MCG/ACT inhaler Inhale 1 puff into the lungs at bedtime as needed. 1 Inhaler 3  . cetirizine (ZYRTEC) 10 MG tablet Take 10 mg by mouth daily.    . Fluocinolone Acetonide 0.01 % OIL Place 4 drops into both ears daily as needed. 20 mL 2  . fluticasone (FLONASE) 50 MCG/ACT nasal spray Place 2 sprays into both nostrils daily.    . magnesium oxide (MAG-OX) 400 MG tablet Take 400 mg by mouth 2 (two) times daily.     [name] MELATONIN PO Take 5 mg by mouth 2 (two) times daily.     . metFORMIN (GLUCOPHAGE) 500 MG tablet Take 1 tablet (500 mg total) by mouth 2 (two) times daily with a meal. 180 tablet 1  . Multiple Vitamin (MULTIVITAMIN) tablet Take 1 tablet by mouth daily.    [name] omeprazole (PRILOSEC) 40 MG capsule Take 1 capsule (40 mg total) by mouth daily as needed. 90 capsule 1  . Probiotic Product (PROBIOTIC PO) Take 1 capsule by mouth 2 (two) times daily.     . simvastatin (ZOCOR) 40 MG tablet Take 1 tablet (40 mg total) by mouth at bedtime. 90 tablet 3   No facility-administered medications prior to visit.    Allergies  Allergen Reactions  . Azithromycin Rash  . Penicillins Hives and Rash    ROS Review of Systems  Constitutional: Negative for chills, diaphoresis, fatigue, fever and unexpected weight change.  HENT: Negative.   Eyes: Negative for photophobia and visual disturbance.  Respiratory: Negative.  Negative for cough, shortness of breath and wheezing.   Cardiovascular: Negative for chest pain and palpitations.  Gastrointestinal: Positive for abdominal pain. Negative for anal bleeding, blood in stool, constipation, diarrhea, nausea, rectal pain and vomiting.  Endocrine: Negative for polyphagia and polyuria.  Genitourinary: Negative for frequency, hematuria and urgency.  Musculoskeletal: Negative for arthralgias and myalgias.    Skin: Positive for rash.  Allergic/Immunologic: Negative for immunocompromised state.  Neurological: Negative for weakness and headaches.  Hematological: Does not bruise/bleed easily.  Psychiatric/Behavioral: Negative.  Objective:    Physical Exam  Constitutional: She is oriented to person, place, and time. She appears well-developed and well-nourished. No distress.  HENT:  Head: Normocephalic and atraumatic.  Right Ear: External ear normal.  Left Ear: External ear normal.  Eyes: Pupils are equal, round, and reactive to light. Conjunctivae are normal. Right eye exhibits no discharge. Left eye exhibits no discharge. No scleral icterus.  Neck: No JVD present. No tracheal deviation present. No thyromegaly present.  Cardiovascular: Normal rate, regular rhythm and normal heart sounds.  Pulmonary/Chest: Effort normal and breath sounds normal. No stridor.  Abdominal: Soft. Bowel sounds are normal. She exhibits no distension. There is no abdominal tenderness. There is no rebound and no guarding.  Musculoskeletal:        General: No edema.  Neurological: She is alert and oriented to person, place, and time.  Skin: Skin is warm and dry. She is not diaphoretic.     Psychiatric: She has a normal mood and affect. Her behavior is normal.    BP (!) 152/70   Pulse 82   Temp (!) 96.6 F (35.9 C) (Tympanic)   Ht 5\' 3"  (1.6 m)   Wt 163 lb 6.4 oz (74.1 kg)   SpO2 94%   BMI 28.95 kg/m  Wt Readings from Last 3 Encounters:  05/04/19 163 lb 6.4 oz (74.1 kg)  04/03/19 164 lb 3.2 oz (74.5 kg)  01/20/19 165 lb (74.8 kg)     Health Maintenance Due  Topic Date Due  . TETANUS/TDAP  Never done    There are no preventive care reminders to display for this patient.  Lab Results  Component Value Date   TSH 1.53 01/20/2019   Lab Results  Component Value Date   WBC 7.2 05/04/2019   HGB 14.0 05/04/2019   HCT 41.5 05/04/2019   MCV 93.9 05/04/2019   PLT 280.0 05/04/2019   Lab Results   Component Value Date   NA 138 05/04/2019   K 4.0 05/04/2019   CO2 26 05/04/2019   GLUCOSE 92 05/04/2019   BUN 24 (H) 05/04/2019   CREATININE 0.79 05/04/2019   BILITOT 0.2 05/04/2019   ALKPHOS 80 05/04/2019   AST 22 05/04/2019   ALT 45 (H) 05/04/2019   PROT 7.4 05/04/2019   ALBUMIN 4.3 05/04/2019   CALCIUM 9.7 05/04/2019   GFR 72.87 05/04/2019   Lab Results  Component Value Date   CHOL 160 04/13/2019   Lab Results  Component Value Date   HDL 43.80 04/13/2019   Lab Results  Component Value Date   LDLCALC 83 04/13/2019   Lab Results  Component Value Date   TRIG 167.0 (H) 04/13/2019   Lab Results  Component Value Date   CHOLHDL 4 04/13/2019   Lab Results  Component Value Date   HGBA1C 6.7 (H) 04/13/2019      Assessment & Plan:   Problem List Items Addressed This Visit      Cardiovascular and Mediastinum   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 hydrOXYzine (ATARAX/VISTARIL) 25 MG tablet    Sig: Take 1 tablet (25 mg total) by mouth 3 (three) times daily as needed.    Dispense:  30 tablet    Refill:  0    Follow-up: Return in about 4 weeks (around 06/01/2019).  Patient was given information on vasculitis.  She will continue Benadryl throughout the day and use the Atarax mostly at nighttime.  Dermatology referral for second opinion.  [name], MD

## 2019-05-05 LAB — URINALYSIS, ROUTINE W REFLEX MICROSCOPIC
Bilirubin Urine: NEGATIVE
Ketones, ur: NEGATIVE
Nitrite: NEGATIVE
Specific Gravity, Urine: <=1.005 — AB (ref 1.000–1.030)
Total Protein, Urine: NEGATIVE
Urine Glucose: NEGATIVE
Urobilinogen, UA: 0.2 (ref 0.0–1.0)
pH: 6 (ref 5.0–8.0)

## 2019-05-05 LAB — COMPREHENSIVE METABOLIC PANEL
ALT: 45 U/L — ABNORMAL HIGH (ref 0–35)
AST: 22 U/L (ref 0–37)
Albumin: 4.3 g/dL (ref 3.5–5.2)
Alkaline Phosphatase: 80 U/L (ref 39–117)
BUN: 24 mg/dL — ABNORMAL HIGH (ref 6–23)
CO2: 26 mEq/L (ref 19–32)
Calcium: 9.7 mg/dL (ref 8.4–10.5)
Chloride: 102 mEq/L (ref 96–112)
Creatinine, Ser: 0.79 mg/dL (ref 0.40–1.20)
GFR: 72.87 mL/min (ref >60.00)
Glucose, Bld: 92 mg/dL (ref 70–99)
Potassium: 4 mEq/L (ref 3.5–5.1)
Sodium: 138 mEq/L (ref 135–145)
Total Bilirubin: 0.2 mg/dL (ref 0.2–1.2)
Total Protein: 7.4 g/dL (ref 6.0–8.3)

## 2019-05-05 LAB — CBC
HCT: 41.5 % (ref 36.0–46.0)
Hemoglobin: 14 g/dL (ref 12.0–15.0)
MCHC: 33.7 g/dL (ref 30.0–36.0)
MCV: 93.9 fl (ref 78.0–100.0)
Platelets: 280 10*3/uL (ref 150.0–400.0)
RBC: 4.41 Mil/uL (ref 3.87–5.11)
RDW: 12.6 % (ref 11.5–15.5)
WBC: 7.2 10*3/uL (ref 4.0–10.5)

## 2019-05-05 LAB — SEDIMENTATION RATE: Sed Rate: 24 mm/hr (ref 0–30)

## 2019-05-22 DIAGNOSIS — B351 Tinea unguium: Secondary | ICD-10-CM | POA: Diagnosis not present

## 2019-05-22 DIAGNOSIS — D2222 Melanocytic nevi of left ear and external auricular canal: Secondary | ICD-10-CM | POA: Diagnosis not present

## 2019-05-22 DIAGNOSIS — L821 Other seborrheic keratosis: Secondary | ICD-10-CM | POA: Diagnosis not present

## 2019-05-22 DIAGNOSIS — D2371 Other benign neoplasm of skin of right lower limb, including hip: Secondary | ICD-10-CM | POA: Diagnosis not present

## 2019-05-25 NOTE — Addendum Note (Signed)
Addended by: Jon Billings on: 05/25/2019 05:03 PM   Modules accepted: Orders

## 2019-05-26 ENCOUNTER — Other Ambulatory Visit (INDEPENDENT_AMBULATORY_CARE_PROVIDER_SITE_OTHER): Payer: Medicare Other

## 2019-05-26 ENCOUNTER — Other Ambulatory Visit: Payer: Self-pay

## 2019-05-26 ENCOUNTER — Ambulatory Visit (AMBULATORY_SURGERY_CENTER): Payer: Self-pay | Admitting: *Deleted

## 2019-05-26 VITALS — Temp 95.9°F | Ht 63.0 in | Wt 165.4 lb

## 2019-05-26 DIAGNOSIS — E7800 Pure hypercholesterolemia, unspecified: Secondary | ICD-10-CM

## 2019-05-26 DIAGNOSIS — K219 Gastro-esophageal reflux disease without esophagitis: Secondary | ICD-10-CM

## 2019-05-26 LAB — LIPID PANEL
Cholesterol: 135 mg/dL (ref 0–200)
HDL: 38.5 mg/dL — ABNORMAL LOW (ref >39.00)
LDL Cholesterol: 76 mg/dL (ref 0–99)
NonHDL: 96.43
Total CHOL/HDL Ratio: 4
Triglycerides: 104 mg/dL (ref 0.0–149.0)
VLDL: 20.8 mg/dL (ref 0.0–40.0)

## 2019-05-26 LAB — COMPREHENSIVE METABOLIC PANEL
ALT: 25 U/L (ref 0–35)
AST: 18 U/L (ref 0–37)
Albumin: 4.4 g/dL (ref 3.5–5.2)
Alkaline Phosphatase: 62 U/L (ref 39–117)
BUN: 20 mg/dL (ref 6–23)
CO2: 29 mEq/L (ref 19–32)
Calcium: 9.5 mg/dL (ref 8.4–10.5)
Chloride: 104 mEq/L (ref 96–112)
Creatinine, Ser: 0.76 mg/dL (ref 0.40–1.20)
GFR: 76.18 mL/min (ref >60.00)
Glucose, Bld: 94 mg/dL (ref 70–99)
Potassium: 3.8 mEq/L (ref 3.5–5.1)
Sodium: 139 mEq/L (ref 135–145)
Total Bilirubin: 0.4 mg/dL (ref 0.2–1.2)
Total Protein: 6.7 g/dL (ref 6.0–8.3)

## 2019-05-26 LAB — LDL CHOLESTEROL, DIRECT: Direct LDL: 78 mg/dL

## 2019-05-26 NOTE — Progress Notes (Signed)
Pt had second covid vaccine greater than 2 weeks ago  Pt is aware that care partner will wait in the car during procedure; if they feel like they will be too hot or cold to wait in the car; they may wait in the 4 th floor lobby. Patient is aware to bring only one care partner. We want them to wear a mask (we do not have any that we can provide them), practice social distancing, and we will check their temperatures when they get here.  I did remind the patient that their care partner needs to stay in the parking lot the entire time and have a cell phone available, we will call them when the pt is ready for discharge. Patient will wear mask into building.   No egg or soy allergy  No home oxygen use   No medications for weight loss taken  emmi information given   No trouble with anesthesia, difficulty with intubation or hx/fam hx of malignant hyperthermia per pt

## 2019-06-04 ENCOUNTER — Encounter: Payer: Medicare Other | Admitting: Gastroenterology

## 2019-06-14 ENCOUNTER — Encounter: Payer: Self-pay | Admitting: Family Medicine

## 2019-06-15 NOTE — Telephone Encounter (Signed)
Pt requesting refill on Valtrex. Please advise if okay to refill on dosage?

## 2019-06-15 NOTE — Telephone Encounter (Signed)
Don't see this on her list. Could do a virtual.

## 2019-06-15 NOTE — Telephone Encounter (Signed)
Please call pt and schedule virtual visit for Valtrex prescription per Dr. Ethelene Hal.

## 2019-06-16 ENCOUNTER — Encounter: Payer: Self-pay | Admitting: Gastroenterology

## 2019-06-16 ENCOUNTER — Other Ambulatory Visit: Payer: Self-pay

## 2019-06-16 ENCOUNTER — Ambulatory Visit (AMBULATORY_SURGERY_CENTER): Payer: Medicare Other | Admitting: Gastroenterology

## 2019-06-16 VITALS — BP 161/85 | HR 61 | Temp 96.9°F | Resp 17 | Ht 63.0 in | Wt 165.4 lb

## 2019-06-16 DIAGNOSIS — K2970 Gastritis, unspecified, without bleeding: Secondary | ICD-10-CM

## 2019-06-16 DIAGNOSIS — K222 Esophageal obstruction: Secondary | ICD-10-CM | POA: Diagnosis not present

## 2019-06-16 DIAGNOSIS — K219 Gastro-esophageal reflux disease without esophagitis: Secondary | ICD-10-CM

## 2019-06-16 DIAGNOSIS — E669 Obesity, unspecified: Secondary | ICD-10-CM | POA: Diagnosis not present

## 2019-06-16 DIAGNOSIS — K317 Polyp of stomach and duodenum: Secondary | ICD-10-CM | POA: Diagnosis not present

## 2019-06-16 DIAGNOSIS — K3189 Other diseases of stomach and duodenum: Secondary | ICD-10-CM | POA: Diagnosis not present

## 2019-06-16 DIAGNOSIS — K449 Diaphragmatic hernia without obstruction or gangrene: Secondary | ICD-10-CM

## 2019-06-16 DIAGNOSIS — J45909 Unspecified asthma, uncomplicated: Secondary | ICD-10-CM | POA: Diagnosis not present

## 2019-06-16 DIAGNOSIS — E119 Type 2 diabetes mellitus without complications: Secondary | ICD-10-CM | POA: Diagnosis not present

## 2019-06-16 MED ORDER — SODIUM CHLORIDE 0.9 % IV SOLN: 500.0000 mL | Freq: Once | INTRAVENOUS | Status: DC

## 2019-06-16 MED ORDER — DEXILANT 60 MG PO CPDR: 60.0000 mg | DELAYED_RELEASE_CAPSULE | Freq: Every day | ORAL | 2 refills | Status: DC

## 2019-06-16 NOTE — Patient Instructions (Addendum)
Please read handouts provided. Continue present medications. Await pathology results.      YOU HAD AN ENDOSCOPIC PROCEDURE TODAY AT [facility]:   Refer to the procedure report that was given to you for any specific questions about what was found during the examination.  If the procedure report does not answer your questions, please call your gastroenterologist to clarify.  If you requested that your care partner not be given the details of your procedure findings, then the procedure report has been included in a sealed envelope for you to review at your convenience later.  YOU SHOULD EXPECT: Some feelings of bloating in the abdomen. Passage of more gas than usual.  Walking can help get rid of the air that was put into your GI tract during the procedure and reduce the bloating. If you had a lower endoscopy (such as a colonoscopy or flexible sigmoidoscopy) you may notice spotting of blood in your stool or on the toilet paper. If you underwent a bowel prep for your procedure, you may not have a normal bowel movement for a few days.  Please Note:  You might notice some irritation and congestion in your nose or some drainage.  This is from the oxygen used during your procedure.  There is no need for concern and it should clear up in a day or so.  SYMPTOMS TO REPORT IMMEDIATELY:    Following upper endoscopy (EGD)  Vomiting of blood or coffee ground material  New chest pain or pain under the shoulder blades  Painful or persistently difficult swallowing  New shortness of breath  Fever of 100F or higher  Black, tarry-looking stools  For urgent or emergent issues, a gastroenterologist can be reached at any hour by calling [phone]. Do not use MyChart messaging for urgent concerns.    DIET:  We do recommend a small meal at first, but then you may proceed to your regular diet.  Drink plenty of fluids but you should avoid alcoholic beverages for 24 hours.  ACTIVITY:  You  should plan to take it easy for the rest of today and you should NOT DRIVE or use heavy machinery until tomorrow (because of the sedation medicines used during the test).    FOLLOW UP: Our staff will call the number listed on your records 48-72 hours following your procedure to check on you and address any questions or concerns that you may have regarding the information given to you following your procedure. If we do not reach you, we will leave a message.  We will attempt to reach you two times.  During this call, we will ask if you have developed any symptoms of COVID 19. If you develop any symptoms (ie: fever, flu-like symptoms, shortness of breath, cough etc.) before then, please call [phone].  If you test positive for Covid 19 in the 2 weeks post procedure, please call and report this information to [place].    If any biopsies were taken you will be contacted by phone or by letter within the next 1-3 weeks.  Please call us at [phone] if you have not heard about the biopsies in 3 weeks.    SIGNATURES/CONFIDENTIALITY: You and/or your care partner have signed paperwork which will be entered into your electronic medical record.  These signatures attest to the fact that that the information above on your After Visit Summary has been reviewed and is understood.  Full responsibility of the confidentiality of this discharge information lies with you and/or  your care-partner.YOU HAD AN ENDOSCOPIC PROCEDURE TODAY AT [facility]:   Refer to the procedure report that was given to you for any specific questions about what was found during the examination.  If the procedure report does not answer your questions, please call your gastroenterologist to clarify.  If you requested that your care partner not be given the details of your procedure findings, then the procedure report has been included in a sealed envelope for you to review at your convenience later.  YOU SHOULD EXPECT: Some  feelings of bloating in the abdomen. Passage of more gas than usual.  Walking can help get rid of the air that was put into your GI tract during the procedure and reduce the bloating. If you had a lower endoscopy (such as a colonoscopy or flexible sigmoidoscopy) you may notice spotting of blood in your stool or on the toilet paper. If you underwent a bowel prep for your procedure, you may not have a normal bowel movement for a few days.  Please Note:  You might notice some irritation and congestion in your nose or some drainage.  This is from the oxygen used during your procedure.  There is no need for concern and it should clear up in a day or so.  SYMPTOMS TO REPORT IMMEDIATELY:    Following upper endoscopy (EGD)  Vomiting of blood or coffee ground material  New chest pain or pain under the shoulder blades  Painful or persistently difficult swallowing  New shortness of breath  Fever of 100F or higher  Black, tarry-looking stools  For urgent or emergent issues, a gastroenterologist can be reached at any hour by calling [phone]. Do not use MyChart messaging for urgent concerns.    DIET:  We do recommend a small meal at first, but then you may proceed to your regular diet.  Drink plenty of fluids but you should avoid alcoholic beverages for 24 hours.  ACTIVITY:  You should plan to take it easy for the rest of today and you should NOT DRIVE or use heavy machinery until tomorrow (because of the sedation medicines used during the test).    FOLLOW UP: Our staff will call the number listed on your records 48-72 hours following your procedure to check on you and address any questions or concerns that you may have regarding the information given to you following your procedure. If we do not reach you, we will leave a message.  We will attempt to reach you two times.  During this call, we will ask if you have developed any symptoms of COVID 19. If you develop any symptoms (ie: fever, flu-like  symptoms, shortness of breath, cough etc.) before then, please call [phone].  If you test positive for Covid 19 in the 2 weeks post procedure, please call and report this information to [place].    If any biopsies were taken you will be contacted by phone or by letter within the next 1-3 weeks.  Please call us at [phone] if you have not heard about the biopsies in 3 weeks.    SIGNATURES/CONFIDENTIALITY: You and/or your care partner have signed paperwork which will be entered into your electronic medical record.  These signatures attest to the fact that that the information above on your After Visit Summary has been reviewed and is understood.  Full responsibility of the confidentiality of this discharge information lies with you and/or your care-partner.

## 2019-06-16 NOTE — Op Note (Signed)
[facility] Patient Name: Alejandra Hess Procedure Date: 06/16/2019 10:08 AM MRN: [id number] Endoscopist: Justice Britain , MD Age: 64 Referring MD:  Date of Birth: 12-Apr-1955 Gender: Female Account #: [id number] Procedure:                Upper GI endoscopy Indications:              Heartburn, Gastro-esophageal reflux disease,                            Follow-up of gastro-esophageal reflux disease,                            Follow-up of esophagitis Medicines:                Monitored Anesthesia Care Procedure:                Pre-Anesthesia Assessment:                           - Prior to the procedure, a History and Physical                            was performed, and patient medications and                            allergies were reviewed. The patient's tolerance of                            previous anesthesia was also reviewed. The risks                            and benefits of the procedure and the sedation                            options and risks were discussed with the patient.                            All questions were answered, and informed consent                            was obtained. Prior Anticoagulants: The patient has                            taken no previous anticoagulant or antiplatelet                            agents. ASA Grade Assessment: II - A patient with                            mild systemic disease. After reviewing the risks                            and benefits, the patient was deemed in  satisfactory condition to undergo the procedure.                           After obtaining informed consent, the endoscope was                            passed under direct vision. Throughout the                            procedure, the patient's blood pressure, pulse, and                            oxygen saturations were monitored continuously. The                            Endoscope was introduced  through the mouth, and                            advanced to the second part of duodenum. The upper                            GI endoscopy was accomplished without difficulty.                            The patient tolerated the procedure. Scope In: Scope Out: Findings:                 No gross mucosal lesions were noted in the entire                            esophagus. Biopsies were taken with a cold forceps                            for histology.                           A widely patent and non-obstructing Schatzki ring                            was found at the gastroesophageal junction. The                            Z-line is at 35 cm.                           A small hiatal hernia was present less than 2 cm.                           Multiple small sessile polyps with no bleeding and                            no stigmata of recent bleeding were found in the  gastric body - appearance of fundic gland polyps.                            Biopsies were taken with a cold forceps for                            histology from a few to rule out adenomatous tissue.                           Striped mildly erythematous mucosa without bleeding                            was found in the gastric antrum.                           No other gross lesions were noted in the entire                            examined stomach. Biopsies were taken with a cold                            forceps for histology and Helicobacter pylori                            testing.                           No gross lesions were noted in the duodenal bulb,                            in the first portion of the duodenum and in the                            second portion of the duodenum. Complications:            No immediate complications. Estimated Blood Loss:     Estimated blood loss was minimal. Impression:               - No gross mucosal lesions in esophagus. Biopsied.                            - Widely patent and non-obstructing Schatzki ring.                           - Small hiatal hernia.                           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                            Biopsied.                           - Erythematous mucosa in the antrum. Biopsied.                           -  No gross lesions in the duodenal bulb, in the                            first portion of the duodenum and in the second                            portion of the duodenum. Recommendation:           - The patient will be observed post-procedure,                            until all discharge criteria are met.                           - Discharge patient to home.                           - Patient has a contact number available for                            emergencies. The signs and symptoms of potential                            delayed complications were discussed with the                            patient. Return to normal activities tomorrow.                            Written discharge instructions were provided to the                            patient.                           - Resume previous diet.                           - Continue present medications.                           - Await pathology results.                           - Follow up to be dictated based on final results                            of pathology.                           - The findings and recommendations were discussed                            with the patient. Justice Britain, MD 06/16/2019 10:40:15 AM

## 2019-06-16 NOTE — Telephone Encounter (Signed)
This was just written 04/03/19, Does she need an appt for it again this soon?

## 2019-06-16 NOTE — Progress Notes (Signed)
Report to PACU, RN, vss, BBS= Clear.  

## 2019-06-16 NOTE — Progress Notes (Signed)
Called to room to assist during endoscopic procedure.  Patient ID and intended procedure confirmed with present staff. Received instructions for my participation in the procedure from the performing physician.  

## 2019-06-16 NOTE — Progress Notes (Signed)
Pt's states no medical or surgical changes since previsit or office visit.  Temp taken by JB VS taken by DT

## 2019-06-18 ENCOUNTER — Telehealth: Payer: Self-pay | Admitting: *Deleted

## 2019-06-18 ENCOUNTER — Telehealth: Payer: Self-pay

## 2019-06-18 NOTE — Telephone Encounter (Signed)
Xrays of his back had showed significant arthritis. I had wanted him to see the sports med doctor. Has he had a chance to go yet?

## 2019-06-18 NOTE — Telephone Encounter (Signed)
Will proceed with Dexilant Samples - Desiree, can you please get them in a bag for patient to pick up later today.  Will proceed with Carafate for a 2-week course.  Ideally, the suspension but pill OK if necessary based on cost.  1 g BID-QID.  Start by taking once in AM and then once in PM or before going to bed.  Can use up to 4 times daily with each meal.  Do not take any medications 1 hour before or after use of Carafate.  Chong Sicilian, can you work on this.  Thanks. GM

## 2019-06-18 NOTE — Telephone Encounter (Signed)
  Follow up Call-  Call back number 06/16/2019  Post procedure Call Back phone  # [phone]  Permission to leave phone message Yes  Some recent data might be hidden     Patient questions:  Do you have a fever, pain , or abdominal swelling? No. Pain Score  0 *  Have you tolerated food without any problems? Yes.    Have you been able to return to your normal activities? Yes.    Do you have any questions about your discharge instructions: Diet   No. Medications  No. Follow up visit  No.  Do you have questions or concerns about your Care? Yes.    Actions: * If pain score is 4 or above: No action needed, pain <4.  1. Have you developed a fever since your procedure? no  2.   Have you had an respiratory symptoms (SOB or cough) since your procedure? no  3.   Have you tested positive for COVID 19 since your procedure no  4.   Have you had any family members/close contacts diagnosed with the COVID 19 since your procedure? no   If yes to any of these questions please route to Joylene John, RN and Erenest Rasher, RN

## 2019-06-18 NOTE — Telephone Encounter (Signed)
Pt had call back already. No need for another call.

## 2019-06-19 ENCOUNTER — Encounter: Payer: Self-pay | Admitting: Gastroenterology

## 2019-06-19 ENCOUNTER — Telehealth: Payer: Self-pay | Admitting: Gastroenterology

## 2019-06-19 MED ORDER — SUCRALFATE 1 G PO TABS: 1.0000 g | ORAL_TABLET | Freq: Four times a day (QID) | ORAL | 3 refills | Status: DC

## 2019-06-19 NOTE — Telephone Encounter (Signed)
Yes

## 2019-06-19 NOTE — Telephone Encounter (Signed)
Will proceed with Carafate for a 2-week course.  Ideally, the suspension but pill OK if necessary based on cost.  1 g BID-QID.  Start by taking once in AM and then once in PM or before going to bed.  Can use up to 4 times daily with each meal.  Do not take any medications 1 hour before or after use of Carafate.  Chong Sicilian, can you work on this.  The prescription has been sent as ordered.  Suspension not covered by insurance.  Tablet sent.  Pt aware

## 2019-06-19 NOTE — Telephone Encounter (Signed)
Patient calling to follow up on medications please call her

## 2019-06-22 ENCOUNTER — Telehealth: Payer: Self-pay | Admitting: Internal Medicine

## 2019-06-22 NOTE — Telephone Encounter (Signed)
Called spoke with patient who reported that during her consult with Dr Melvyn Novas last year on 8.12.2020 she was unable to do an office spirometry or PFT because of Covid and her PCP Dr Ethelene Hal would like this to be done.  Patient asked if this can be done at her appt in August (not yet scheduled).  Advised patient of [facility] protocol on PFTs and the requirement for covid testing prior to.  Patient okay with this and wanted to go ahead and get everything scheduled.  PFT and appt with Dr Melvyn Novas scheduled for 8.12.2021 Covid testing scheduled for 8.9.2021  Nothing further needed at this time; will sign off.

## 2019-07-06 ENCOUNTER — Encounter: Payer: Self-pay | Admitting: Family Medicine

## 2019-07-06 DIAGNOSIS — M79674 Pain in right toe(s): Secondary | ICD-10-CM

## 2019-07-06 DIAGNOSIS — L600 Ingrowing nail: Secondary | ICD-10-CM

## 2019-07-06 NOTE — Telephone Encounter (Signed)
Okay to refer? 

## 2019-07-22 ENCOUNTER — Other Ambulatory Visit: Payer: Self-pay | Admitting: Podiatry

## 2019-07-22 ENCOUNTER — Ambulatory Visit (INDEPENDENT_AMBULATORY_CARE_PROVIDER_SITE_OTHER): Payer: Medicare Other

## 2019-07-22 ENCOUNTER — Ambulatory Visit (INDEPENDENT_AMBULATORY_CARE_PROVIDER_SITE_OTHER): Payer: Medicare Other | Admitting: Podiatry

## 2019-07-22 ENCOUNTER — Other Ambulatory Visit: Payer: Self-pay

## 2019-07-22 ENCOUNTER — Encounter: Payer: Self-pay | Admitting: Podiatry

## 2019-07-22 DIAGNOSIS — L600 Ingrowing nail: Secondary | ICD-10-CM

## 2019-07-22 DIAGNOSIS — M79671 Pain in right foot: Secondary | ICD-10-CM

## 2019-07-22 DIAGNOSIS — M79672 Pain in left foot: Secondary | ICD-10-CM

## 2019-07-22 DIAGNOSIS — M722 Plantar fascial fibromatosis: Secondary | ICD-10-CM | POA: Diagnosis not present

## 2019-07-22 NOTE — Patient Instructions (Signed)

## 2019-07-23 ENCOUNTER — Encounter: Payer: Self-pay | Admitting: Family Medicine

## 2019-07-23 ENCOUNTER — Ambulatory Visit: Payer: Medicare Other | Admitting: Gastroenterology

## 2019-07-23 ENCOUNTER — Encounter: Payer: Self-pay | Admitting: Podiatry

## 2019-07-23 NOTE — Telephone Encounter (Signed)
[facility] stores are doing it.

## 2019-07-23 NOTE — Progress Notes (Signed)
Subjective:   Patient ID: Alejandra Hess, female   DOB: 64 y.o.   MRN: [id number]   HPI Patient presents stating she has developed significant pain in the plantar heels of both feet that is been present for several months and also has chronic ingrown toenails of the big toes with overall structural changes the nailbeds themselves.  States they get sore and she wants them corrected at 1 point and patient does not smoke likes to be active   Review of Systems  All other systems reviewed and are negative.       Objective:  Physical Exam Vitals and nursing note reviewed.  Constitutional:      Appearance: She is well-developed.  Pulmonary:     Effort: Pulmonary effort is normal.  Musculoskeletal:        General: Normal range of motion.  Skin:    General: Skin is warm.  Neurological:     Mental Status: She is alert.     Neurovascular status found to be intact muscle strength was adequate range of motion within normal limits.  Patient is noted to have exquisite discomfort in the plantar aspect of the heel region bilateral with inflammation fluid of the medial band and is noted to have damaged hallux nails bilateral with a prompt middle portion and incurvation of the medial border that are painful when pressed and making shoe gear difficult.  Patient is noted to have good digital perfusion is well oriented x3 with moderate depression of the arch     Assessment:  Severe acute plantar fasciitis bilateral with inflammation present and swelling along with chronic ingrown toenail deformity hallux bilateral     Plan:  H&P reviewed both conditions.  We will get a focus on the heels first but I did educate her on ingrown toenail correction which we will do at next visit.  I went ahead today did sterile prep and injected the plantar fascial bilateral 3 mg Kenalog 5 mg Xylocaine and applied fascial brace bilateral to lift the arch along with shoe gear modifications physical therapy.  Patient will  be seen back 2 weeks for probable correction of the ingrown toenail and monitoring the heels  X-rays indicate small spur formation no indication stress fracture arthritis with moderate depression of the arch

## 2019-07-24 DIAGNOSIS — Z20822 Contact with and (suspected) exposure to covid-19: Secondary | ICD-10-CM | POA: Diagnosis not present

## 2019-07-24 DIAGNOSIS — Z03818 Encounter for observation for suspected exposure to other biological agents ruled out: Secondary | ICD-10-CM | POA: Diagnosis not present

## 2019-07-24 DIAGNOSIS — Z20828 Contact with and (suspected) exposure to other viral communicable diseases: Secondary | ICD-10-CM | POA: Diagnosis not present

## 2019-07-24 DIAGNOSIS — Z6829 Body mass index (BMI) 29.0-29.9, adult: Secondary | ICD-10-CM | POA: Diagnosis not present

## 2019-07-30 MED ORDER — CELECOXIB 200 MG PO CAPS
200.0000 mg | ORAL_CAPSULE | Freq: Two times a day (BID) | ORAL | 1 refills | Status: DC
Start: 2019-07-30 — End: 2019-10-08

## 2019-07-30 NOTE — Addendum Note (Signed)
Addended by: Cranford Mon R on: 07/30/2019 04:57 PM   Modules accepted: Orders

## 2019-08-05 ENCOUNTER — Ambulatory Visit: Payer: Medicare Other | Admitting: Podiatry

## 2019-08-13 ENCOUNTER — Encounter: Payer: Self-pay | Admitting: Gastroenterology

## 2019-08-13 ENCOUNTER — Ambulatory Visit (INDEPENDENT_AMBULATORY_CARE_PROVIDER_SITE_OTHER): Payer: Medicare Other | Admitting: Gastroenterology

## 2019-08-13 VITALS — BP 140/80 | HR 71 | Ht 64.0 in | Wt 166.0 lb

## 2019-08-13 DIAGNOSIS — Z86010 Personal history of colonic polyps: Secondary | ICD-10-CM

## 2019-08-13 DIAGNOSIS — K2950 Unspecified chronic gastritis without bleeding: Secondary | ICD-10-CM | POA: Diagnosis not present

## 2019-08-13 DIAGNOSIS — K222 Esophageal obstruction: Secondary | ICD-10-CM | POA: Diagnosis not present

## 2019-08-13 DIAGNOSIS — K449 Diaphragmatic hernia without obstruction or gangrene: Secondary | ICD-10-CM | POA: Diagnosis not present

## 2019-08-13 DIAGNOSIS — K2100 Gastro-esophageal reflux disease with esophagitis, without bleeding: Secondary | ICD-10-CM | POA: Diagnosis not present

## 2019-08-13 NOTE — Patient Instructions (Signed)
It has been recommended to you by your physician that you have a(n) Manometry/Ph Impendence completed. We did not schedule the procedure(s) today. I will contact you by phone later this week with an appointment.   We will also get you an appointment with Dr. Bryan Lemma to discuss possible TIFF.  If you are age 66 or older, your body mass index should be between 23-30. Your Body mass index is 28.49 kg/m. If this is out of the aforementioned range listed, please consider follow up with your Primary Care Provider.  If you are age 66 or younger, your body mass index should be between 19-25. Your Body mass index is 28.49 kg/m. If this is out of the aformentioned range listed, please consider follow up with your Primary Care Provider.    Thank you for choosing me and [facility].  Dr. Rush Landmark

## 2019-08-16 ENCOUNTER — Encounter: Payer: Self-pay | Admitting: Gastroenterology

## 2019-08-16 DIAGNOSIS — Z86010 Personal history of colonic polyps: Secondary | ICD-10-CM | POA: Insufficient documentation

## 2019-08-16 DIAGNOSIS — K449 Diaphragmatic hernia without obstruction or gangrene: Secondary | ICD-10-CM | POA: Insufficient documentation

## 2019-08-16 DIAGNOSIS — K222 Esophageal obstruction: Secondary | ICD-10-CM | POA: Insufficient documentation

## 2019-08-16 DIAGNOSIS — K2970 Gastritis, unspecified, without bleeding: Secondary | ICD-10-CM | POA: Insufficient documentation

## 2019-08-16 NOTE — Progress Notes (Signed)
[place] VISIT   Primary Care Provider [name], MD [place] [address] [phone]  Patient Profile: Alejandra Hess is a 64 y.o. female with a pmh significant for asthma, diabetes, hyperlipidemia, status post cholecystectomy, status post hysterectomy, gastritis, hiatal hernia, GERD.  The patient presents to the [facility] for an evaluation and management of problem(s) noted below:  Problem List 1. Gastroesophageal reflux disease with esophagitis without hemorrhage   2. Hiatal hernia   3. Schatzki's ring   4. Other chronic gastritis without hemorrhage   5. History of colonic polyps     History of Present Illness Please see initial consultation note for full details of HPI.  Interval History The patient returns for scheduled follow-up.  I met her back in April after quite a few months since her initial consultation.  An endoscopy was performed with biopsies consistent with some acid reflux changes as well as gastritis and the findings of a Schatzki ring and small hiatal hernia.The patient had worsening symptoms after the procedure in regards to burning sensation/pyrosis and we attempted Dexilant for treatment as a transition of PPI as well as Carafate.  Carafate helped but Dexilant did not improve symptoms further.  After a period of time the patient symptoms somewhat abated to her baseline mildly improved with PPI which she had transition back to omeprazole.  Patient states that she does not have any dysphagia.  Pyrosis remains an issue and she is wondering if there are any other therapies that can be considered.  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  No new abdominal pain.  No new nausea or vomiting.  GI Review of Systems Positive as above Negative for  dysphagia, early satiety, bloating, change in bowel habits, melena, hematochezia   Review of Systems General: Denies fevers/chills HEENT: Denies oral lesions Cardiovascular: Denies chest pain/palpitations Pulmonary: Denies shortness of breath Gastroenterological: See HPI Genitourinary: Denies darkened urine or hematuria Hematological: Denies easy bruising/bleeding Endocrine: Denies temperature intolerance Dermatological: Denies jaundice Psychological: Mood is stable   Medications Current Outpatient Medications  Medication Sig Dispense Refill  . albuterol (PROVENTIL HFA) 108 (90 Base) MCG/ACT inhaler Inhale into the lungs. PRN    . albuterol (PROVENTIL) (2.5 MG/3ML) 0.083% nebulizer solution PRN  7  . azelastine (ASTELIN) 0.1 % nasal spray Place 2 sprays into both nostrils daily. Use in each nostril as directed 30 mL 2  . budesonide-formoterol (SYMBICORT) 160-4.5 MCG/ACT inhaler Inhale 1 puff into the lungs at bedtime as needed. 1 Inhaler 3  . celecoxib (CELEBREX) 200 MG capsule Take 1 capsule (200 mg total) by mouth 2 (two) times daily. 30 capsule 1  . cetirizine (ZYRTEC) 10 MG tablet Take 10 mg by mouth daily.    . Fluocinolone Acetonide 0.01 % OIL Place 4 drops into both ears daily as needed. 20 mL 2  . fluticasone (FLONASE) 50 MCG/ACT nasal spray Place 2 sprays into both nostrils daily.    . magnesium oxide (MAG-OX) 400 MG tablet Take 400 mg by mouth daily.     . metFORMIN (GLUCOPHAGE) 500 MG tablet Take 1 tablet (500 mg total) by mouth 2 (two) times daily with a meal. 180 tablet 1  . Multiple Vitamin (MULTIVITAMIN) tablet Take 1 tablet by mouth daily.    [name] omeprazole (PRILOSEC) 40 MG capsule Take 40 mg by mouth in  the morning and at bedtime.     . Probiotic Product (PROBIOTIC PO) Take 1 capsule by mouth 2 (two) times daily.     . simvastatin (ZOCOR) 40 MG tablet Take 1 tablet (40 mg total) by mouth at bedtime. 90 tablet 3  . Cimetidine (TAGAMET PO) Take by mouth daily. OTC    .  sucralfate (CARAFATE) 1 g tablet Take 1 tablet (1 g total) by mouth 4 (four) times daily. 120 tablet 3   No current facility-administered medications for this visit.    Allergies Allergies  Allergen Reactions  . Azithromycin Rash  . Penicillins Hives and Rash    Histories Past Medical History:  Diagnosis Date  . Allergy   . Asthma   . DM (diabetes mellitus) ([place])   . GERD (gastroesophageal reflux disease)   . Heart murmur    as a child  . HLD (hyperlipidemia)    Past Surgical History:  Procedure Laterality Date  . ABDOMINAL HYSTERECTOMY    . AUGMENTATION MAMMAPLASTY Bilateral    @ 43 lift with implants  . BREAST EXCISIONAL BIOPSY Right    @ 53  . BREAST EXCISIONAL BIOPSY Left    '@43'$ ?  . CHOLECYSTECTOMY    . COLONOSCOPY    . UPPER GASTROINTESTINAL ENDOSCOPY     Social History   Socioeconomic History  . Marital status: Married    Spouse name: Not on file  . Number of children: 2  . Years of education: Not on file  . Highest education level: Not on file  Occupational History  . Occupation: Therapist, sports  Tobacco Use  . Smoking status: Former Smoker    Types: Cigarettes    Quit date: 2013    Years since quitting: 8.4  . Smokeless tobacco: Never Used  Vaping Use  . Vaping Use: Never used  Substance and Sexual Activity  . Alcohol use: Yes    Comment: 2 glasses of red wine daily  . Drug use: Never  . Sexual activity: Yes    Partners: Male    Comment: 1st intercourse- 29, partners- 5-, MARRIED- 81  Other Topics Concern  . Not on file  Social History Narrative  . Not on file   Social Determinants of Health   Financial Resource Strain:   . Difficulty of Paying Living Expenses:   Food Insecurity:   . Worried About Charity fundraiser in the Last Year:   . Arboriculturist in the Last Year:   Transportation Needs:   . Film/video editor (Medical):   [name] Lack of Transportation (Non-Medical):   Physical Activity:   . Days of Exercise per Week:   . Minutes of  Exercise per Session:   Stress:   . Feeling of Stress :   Social Connections:   . Frequency of Communication with Friends and Family:   . Frequency of Social Gatherings with Friends and Family:   . Attends Religious Services:   . Active Member of Clubs or Organizations:   . Attends Archivist Meetings:   [name] Marital Status:   Intimate Partner Violence:   . Fear of Current or Ex-Partner:   . Emotionally Abused:   [name] Physically Abused:   . Sexually Abused:    Family History  Problem Relation Age of Onset  . Hearing loss Mother   . Hyperlipidemia Mother   . Hypertension Mother   . Diabetes Mother   . Heart attack Father   . Heart disease Father   .  Hyperlipidemia Father   . Hypertension Father   . Melanoma Father   . Asthma Sister   . Hyperlipidemia Sister   . Hypertension Sister   . Hyperlipidemia Sister   . Breast cancer Sister 55  . Asthma Sister   . Depression Sister   . Heart attack Sister   . Heart disease Sister   . Hypertension Sister   . Diabetes Sister   . Breast cancer Maternal Aunt        over 41  . Breast cancer Paternal Aunt        over 79  . Breast cancer Paternal Grandmother        over 54   . Breast cancer Maternal Aunt        over 71   . Colon cancer Maternal Grandfather   . Esophageal cancer Neg Hx   . Inflammatory bowel disease Neg Hx   . Liver disease Neg Hx   . Pancreatic cancer Neg Hx   . Rectal cancer Neg Hx   . Stomach cancer Neg Hx    I have reviewed her medical, social, and family history in detail and updated the electronic medical record as necessary.    PHYSICAL EXAMINATION  BP 140/80   Pulse 71   Ht 5' 4"  (1.626 m)   Wt 166 lb (75.3 kg)   BMI 28.49 kg/m  Wt Readings from Last 3 Encounters:  08/13/19 166 lb (75.3 kg)  06/16/19 165 lb 6.4 oz (75 kg)  05/26/19 165 lb 6.4 oz (75 kg)  GEN: NAD, appears stated age, doesn't appear chronically ill PSYCH: Cooperative, without pressured speech EYE: Conjunctivae pink,  sclerae anicteric ENT: MMM CV: Nontachycardic RESP: No audible wheezing present GI: NABS, soft, ND, without rebound MSK/EXT: No lower extremity edema SKIN: No jaundice NEURO:  Alert & Oriented x 3, no focal deficits   REVIEW OF DATA  I reviewed the following data at the time of this encounter:  GI Procedures and Studies  April 2021 EGD - No gross mucosal lesions in esophagus. Biopsied. - Widely patent and non-obstructing Schatzki ring. - Small hiatal hernia. 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 Biopsied. - Erythematous mucosa in the antrum. Biopsied. - No gross lesions in the duodenal bulb, in the first portion of the duodenum and in the second portion of the duodenum.  Pathology Diagnosis 1. Surgical [P], gastric antrum and gastric body - GASTRIC ANTRAL MUCOSA WITH NONSPECIFIC REACTIVE GASTROPATHY - GASTRIC OXYNTIC MUCOSA WITH PARIETAL CELL HYPERPLASIA AS CAN BE SEEN IN HYPERGASTRINEMIC STATES SUCH AS PPI THERAPY. - WARTHIN STARRY STAIN IS NEGATIVE FOR HELICOBACTER PYLORI 2. Surgical [P], gastric polyps - FUNDIC GLAND POLYP(S) 3. Surgical [P], mid esophagus and distal esophagus - ESOPHAGEAL SQUAMOUS MUCOSA WITH VASCULAR CONGESTION, VASCULAR LAKES, AND SQUAMOUS BALLOONING CONSISTENT WITH REFLUX ESOPHAGITIS - NEGATIVE FOR INCREASED INTRAEPITHELIAL EOSINOPHILS  2017 outside colonoscopy Sessile polyps ranging between 3 and 5 mm was found in the appendiceal orifice region and removed with cold forceps.  Sessile polyp ranging between 3 and 5 mm on the sigmoid colon and removed with cold forceps.  Retroflexed views revealed no other abnormalities.  Laboratory Studies  Reviewed those in epic  Imaging Studies  No new relevant studies to review   ASSESSMENT  Alejandra Hess is a 64 y.o. female with a pmh significant for asthma, diabetes, hyperlipidemia, GERD, status post cholecystectomy, status post hysterectomy.  The patient is seen today for evaluation and  management of:  1. Gastroesophageal reflux disease with esophagitis  without hemorrhage   2. Hiatal hernia   3. Schatzki's ring   4. Other chronic gastritis without hemorrhage   5. History of colonic polyps    The patient is hemodynamically stable.  Her GERD symptoms were exacerbated after her endoscopy for some unclear reason.  She is now stabilized and off Carafate.  While on PPIs, the patient's pyrosis is improved but it is not completely resolved.  The patient wonders if there is additional therapies that may be helpful.  I do think that fundoplication (endoscopically or surgically) versus [place] may be helpful for this patient.  No overt dysphagia currently however we will want to move forward with a manometry to ensure no evidence of IEM or other dysmotility that may be a complicating factor for the patient post any type of procedure.  I have discussed in detail the incidence of going back on PPI therapy after years after a treatment option.  She would like to see if it is possible to come off of all PPIs even for a period in time and hopefully feel better.  I think the hiatal hernia is probably playing some role as well for the patient and so con commitment treatment of that should be helpful.  I am going to refer her to Dr. Bryan Lemma first time opportunity to discuss further details of transoral incision less fundoplication (TIF).  All patient questions were answered, to the best of my ability, and the patient agrees to the aforementioned plan of action with follow-up as indicated.   PLAN  Continue omeprazole 40 mg daily to twice daily (take 30 minutes before meals) Lifestyle modifications already discussed previously with patient Esophageal manometry ordered pH impedance on PPI has been ordered though it may not be necessary based on endoscopic biopsies previously found for insurance approval for any type of surgical/endoscopic intervention Referral to Dr. Bryan Lemma to further discuss TIF     No orders of the defined types were placed in this encounter.   New Prescriptions   No medications on file   Modified Medications   No medications on file    Planned Follow Up No follow-ups on file.   Total Time in Face-to-Face and in Coordination of Care for patient including independent/personal interpretation/review of prior testing, medical history, examination, medication adjustment, communicating results with the patient directly, and documentation with the EHR is 30 minutes.   Justice Britain, MD [facility] Office # [phone]

## 2019-08-20 NOTE — Telephone Encounter (Signed)
Ro can you help with this? Looks like it was done during an office visit.

## 2019-08-24 ENCOUNTER — Encounter: Payer: Self-pay | Admitting: Podiatry

## 2019-08-24 ENCOUNTER — Ambulatory Visit (INDEPENDENT_AMBULATORY_CARE_PROVIDER_SITE_OTHER): Payer: Medicare Other | Admitting: Podiatry

## 2019-08-24 ENCOUNTER — Other Ambulatory Visit: Payer: Self-pay

## 2019-08-24 VITALS — Temp 97.3°F

## 2019-08-24 DIAGNOSIS — M722 Plantar fascial fibromatosis: Secondary | ICD-10-CM

## 2019-08-24 DIAGNOSIS — L600 Ingrowing nail: Secondary | ICD-10-CM

## 2019-08-26 ENCOUNTER — Other Ambulatory Visit: Payer: Self-pay

## 2019-08-26 ENCOUNTER — Telehealth: Payer: Self-pay

## 2019-08-26 ENCOUNTER — Telehealth: Payer: Self-pay | Admitting: Gastroenterology

## 2019-08-26 DIAGNOSIS — Z8719 Personal history of other diseases of the digestive system: Secondary | ICD-10-CM

## 2019-08-26 DIAGNOSIS — K222 Esophageal obstruction: Secondary | ICD-10-CM

## 2019-08-26 DIAGNOSIS — K449 Diaphragmatic hernia without obstruction or gangrene: Secondary | ICD-10-CM

## 2019-08-26 DIAGNOSIS — K219 Gastro-esophageal reflux disease without esophagitis: Secondary | ICD-10-CM

## 2019-08-26 DIAGNOSIS — K2100 Gastro-esophageal reflux disease with esophagitis, without bleeding: Secondary | ICD-10-CM

## 2019-08-26 NOTE — Progress Notes (Signed)
Pt schedule for 11/04/19 @ 8:30am [facility] for Mano/Ph study.

## 2019-08-26 NOTE — Addendum Note (Signed)
Addended byDebbe Mounts on: 08/26/2019 02:43 PM   Modules accepted: Orders, SmartSet

## 2019-08-26 NOTE — Telephone Encounter (Signed)
Returned call to pt. Pt has been r/s to 11/18/19  [facility] @ 8:30am for Mano/Ph- covid test has been r/s to 11/14/19@ 12:00pm. Pt informed via mychart at her request.

## 2019-08-26 NOTE — Telephone Encounter (Signed)
Rovonda have you heard anything about the referral to Dr C for TIF?

## 2019-08-26 NOTE — Telephone Encounter (Signed)
Ok thanks 

## 2019-08-26 NOTE — Telephone Encounter (Signed)
Patient called to reschedule her hospital procedure.

## 2019-08-26 NOTE — Telephone Encounter (Signed)
Pt has been scheduled for 11/04/19 @ [facility] for Mano/PH study - [facility] endo 8:30am. Appt with Dr. Bryan Lemma has been made for 10/26/19 @ 2:00pm [facility] office to discuss pos TIF. Message has been left for pt. I will also send appt's via mychart.

## 2019-08-27 ENCOUNTER — Telehealth: Payer: Self-pay

## 2019-08-27 NOTE — Progress Notes (Signed)
Subjective:   Patient ID: Alejandra Hess, female   DOB: 64 y.o.   MRN: [id number]   HPI Patient states my heels are improved in my ingrown seem to also be somewhat better.  States that she has mild pain but improved and that also she is pleased that the ingrown's appear to be improved   ROS      Objective:  Physical Exam  Neurovascular status intact with patient's heels reduced as far as discomfort go with still mild to moderate discomfort upon deep palpation with incurvated nail bed which are not flared at the current time     Assessment:  Improving fasciitis condition with ingrown toenail condition which is stable     Plan:  H&P reviewed both conditions.  Recommended topical medications and oral medications to continue use for the heels along with stretching exercises shoe gear modifications and we will currently trim the ingrown's and more aggressive surgery may be necessary at 1 point future which I made her aware of today

## 2019-08-27 NOTE — Telephone Encounter (Signed)
Spoke to patient to remind her of upcoming appointments to discuss TIF procedure with Dr Bryan Lemma 10/30/19. She already has Esophageal Manometry scheduled for 11/14/19. Patient would like to be seen sooner for both appointments, I explained to her that there were no sooner appointments for her that would allow her to schedule the TIF procedure sooner. She stated that she will contact Dr Rush Landmark and inquire about having the TIF done at [facility] as she was told they may be able to get her in sooner. She will keep all her scheduled appointments with [place] at this time.

## 2019-08-27 NOTE — Telephone Encounter (Signed)
-----   Message from [place], DO sent at 08/20/2019  5:01 PM EDT ----- Regarding: FW: Referral for potential TIF Can you please set this patient up for a routine appt with me to discuss GERD and possible TIF? Thanks.  ----- Message ----- From: Irving Copas., MD Sent: 08/16/2019   4:39 AM EDT To: Lavena Bullion, DO Subject: Referral for potential TIF                     Vito,I hope you are doing well on this Father's Day.This was the patient that we discussed last week for potential TIF.Helmut Muster was going to work on getting her set up for schedule with you.I currently have her set up for an esophageal manometry with pH impedance testing on PPI for now.This is quite a few weeks in the future though.I appreciate the opportunity for you to meet her and see whether she may be a candidate for TIF.If there is anything else that you need for me please let me know.Thanks.GM

## 2019-09-07 ENCOUNTER — Other Ambulatory Visit: Payer: Self-pay

## 2019-09-07 MED ORDER — OMEPRAZOLE 40 MG PO CPDR: 40.0000 mg | DELAYED_RELEASE_CAPSULE | Freq: Two times a day (BID) | ORAL | 3 refills | Status: DC

## 2019-09-07 MED ORDER — SUCRALFATE 1 GM/10ML PO SUSP
1.0000 g | Freq: Four times a day (QID) | ORAL | 3 refills | Status: DC
Start: 2019-09-07 — End: 2019-09-09

## 2019-09-07 NOTE — Telephone Encounter (Signed)
Patty, I am okay for the patient to have additional prescriptions/refills of Carafate as well as her PPI. Please go ahead and send those. I am CCing Dr. Bryan Lemma on this message to see if he may be able to squeeze the patient in for an earlier follow-up to discuss the potential role of endoscopic TIF for her GERD. Thanks. GM

## 2019-09-07 NOTE — Telephone Encounter (Signed)
Dr Rush Landmark can you review when available?

## 2019-09-09 ENCOUNTER — Telehealth: Payer: Self-pay | Admitting: Gastroenterology

## 2019-09-09 MED ORDER — SUCRALFATE 1 G PO TABS
ORAL_TABLET | ORAL | 1 refills | Status: DC
Start: 2019-09-09 — End: 2019-12-08

## 2019-09-09 NOTE — Telephone Encounter (Signed)
Rx for tablets sent to pharmacy with instructions on how to make a slurry.

## 2019-09-09 NOTE — Telephone Encounter (Signed)
Pharmacy called asking if the script can be changed from solution to tablets please advise

## 2019-09-14 ENCOUNTER — Other Ambulatory Visit: Payer: Self-pay

## 2019-09-14 MED ORDER — PANTOPRAZOLE SODIUM 40 MG PO TBEC
40.0000 mg | DELAYED_RELEASE_TABLET | Freq: Every day | ORAL | 3 refills | Status: DC
Start: 2019-09-14 — End: 2020-02-22

## 2019-09-23 ENCOUNTER — Encounter (HOSPITAL_COMMUNITY): Payer: Self-pay

## 2019-10-05 ENCOUNTER — Ambulatory Visit: Payer: Medicare Other | Admitting: Internal Medicine

## 2019-10-05 ENCOUNTER — Other Ambulatory Visit (HOSPITAL_COMMUNITY): Payer: Medicare Other

## 2019-10-06 ENCOUNTER — Other Ambulatory Visit: Payer: Self-pay | Admitting: *Deleted

## 2019-10-06 DIAGNOSIS — J4530 Mild persistent asthma, uncomplicated: Secondary | ICD-10-CM

## 2019-10-08 ENCOUNTER — Encounter: Payer: Self-pay | Admitting: Adult Health

## 2019-10-08 ENCOUNTER — Ambulatory Visit (INDEPENDENT_AMBULATORY_CARE_PROVIDER_SITE_OTHER): Payer: Medicare Other | Admitting: Internal Medicine

## 2019-10-08 ENCOUNTER — Ambulatory Visit (INDEPENDENT_AMBULATORY_CARE_PROVIDER_SITE_OTHER): Payer: Medicare Other | Admitting: Adult Health

## 2019-10-08 ENCOUNTER — Other Ambulatory Visit: Payer: Self-pay

## 2019-10-08 ENCOUNTER — Ambulatory Visit: Payer: Medicare Other | Admitting: Internal Medicine

## 2019-10-08 VITALS — BP 124/86 | HR 72 | Ht 64.0 in | Wt 166.0 lb

## 2019-10-08 DIAGNOSIS — J45909 Unspecified asthma, uncomplicated: Secondary | ICD-10-CM | POA: Insufficient documentation

## 2019-10-08 DIAGNOSIS — J300 Vasomotor rhinitis: Secondary | ICD-10-CM

## 2019-10-08 DIAGNOSIS — J4530 Mild persistent asthma, uncomplicated: Secondary | ICD-10-CM | POA: Diagnosis not present

## 2019-10-08 LAB — PULMONARY FUNCTION TEST
DL/VA % pred: 139 %
DL/VA: 5.8 ml/min/mmHg/L
DLCO cor % pred: 118 %
DLCO cor: 23.37 ml/min/mmHg
DLCO unc % pred: 118 %
DLCO unc: 23.37 ml/min/mmHg
FEF 25-75 Post: 2.32 L/sec
FEF 25-75 Pre: 1.96 L/sec
FEF2575-%Change-Post: 18 %
FEF2575-%Pred-Post: 111 %
FEF2575-%Pred-Pre: 94 %
FEV1-%Change-Post: 5 %
FEV1-%Pred-Post: 87 %
FEV1-%Pred-Pre: 82 %
FEV1-Post: 2.08 L
FEV1-Pre: 1.98 L
FEV1FVC-%Change-Post: 2 %
FEV1FVC-%Pred-Pre: 105 %
FEV6-%Change-Post: 2 %
FEV6-%Pred-Post: 83 %
FEV6-%Pred-Pre: 81 %
FEV6-Post: 2.51 L
FEV6-Pre: 2.45 L
FEV6FVC-%Pred-Post: 104 %
FEV6FVC-%Pred-Pre: 104 %
FVC-%Change-Post: 2 %
FVC-%Pred-Post: 80 %
FVC-%Pred-Pre: 78 %
FVC-Post: 2.51 L
FVC-Pre: 2.45 L
Post FEV1/FVC ratio: 83 %
Post FEV6/FVC ratio: 100 %
Pre FEV1/FVC ratio: 81 %
Pre FEV6/FVC Ratio: 100 %
RV % pred: 91 %
RV: 1.94 L
TLC % pred: 97 %
TLC: 4.91 L

## 2019-10-08 LAB — CBC WITH DIFFERENTIAL/PLATELET
Basophils Absolute: 0 10*3/uL (ref 0.0–0.1)
Basophils Relative: 0.4 % (ref 0.0–3.0)
Eosinophils Absolute: 0.1 10*3/uL (ref 0.0–0.7)
Eosinophils Relative: 0.8 % (ref 0.0–5.0)
HCT: 43.5 % (ref 36.0–46.0)
Hemoglobin: 14.3 g/dL (ref 12.0–15.0)
Lymphocytes Relative: 36.3 % (ref 12.0–46.0)
Lymphs Abs: 3 10*3/uL (ref 0.7–4.0)
MCHC: 32.9 g/dL (ref 30.0–36.0)
MCV: 94 fl (ref 78.0–100.0)
Monocytes Absolute: 0.4 10*3/uL (ref 0.1–1.0)
Monocytes Relative: 5.4 % (ref 3.0–12.0)
Neutro Abs: 4.7 10*3/uL (ref 1.4–7.7)
Neutrophils Relative %: 57.1 % (ref 43.0–77.0)
Platelets: 278 10*3/uL (ref 150.0–400.0)
RBC: 4.63 Mil/uL (ref 3.87–5.11)
RDW: 13.5 % (ref 11.5–15.5)
WBC: 8.2 10*3/uL (ref 4.0–10.5)

## 2019-10-08 NOTE — Progress Notes (Signed)
@Patient  ID: Alejandra Hess, female    DOB: April 11, 1955, 64 y.o.   MRN: [id number]  Chief Complaint  Patient presents with   Follow-up    Asthma     Referring provider: [name]  HPI: 64 year old female former smoker quit 2010 seen for pulmonary consult 09/2019 to establish for Asthma and chronic rhinitis.   Lifelong allergic rhinitis .  Episode of Eosinophil Asthma (s/p FOB ) treated with steroids/symbicort / Fasenra (x 3 months) . Improved immensely.  Patient is an Retired Horticulturist, commercial history significant for diabetes  TEST/EVENTS :  Spirometry 08/06/16  FEV1  1.65/FVC  2.37 = Ratio  0.70    CXR 03/2019 clear.   10/08/2019 Follow up ; Asthma and AR  Patient returns for a 1 year follow-up.  Patient had episodes of underlying eosinophilic asthma in 9381 treated aggressively while living in [facility]. Improved immensely with Symbicort /Steroids and Fasenra. Took Fasenra x 3 months. Weaned off Steroids and continued on Symbicort.  Says she has done very well. Over last year doing very well started  She has weaned her self off of Symbicort . Has been off Symbicort 4 months with no flare. Good activity level. No in tolerances. Remains on Zyrtec and flonase.  Plays golf does well. No albuterol use.  Pulmonary function testing today showed normal lung function with no airflow obstruction or restriction.  FEV1 87%, ratio 83, FVC 80%.,  No significant bronchodilator response.,  Mid flow reversibility.  DLCO 119%.  Had Covid 15 December 2018 , had mild case. Took steroids. Recovered well within couple of weeks.  No known sequelae   Allergies  Allergen Reactions   Azithromycin Rash   Penicillins Hives and Rash    Immunization History  Administered Date(s) Administered   Fluad Quad(high Dose 65+) 10/21/2018   Influenza Inj Mdck Quad Pf 12/10/2017   Influenza-Unspecified 09/26/2016   PFIZER SARS-COV-2 Vaccination 04/04/2019, 04/29/2019   PPD Test 07/24/2016    Pneumococcal Polysaccharide-23 10/21/2018    Past Medical History:  Diagnosis Date   Allergy    Asthma    DM (diabetes mellitus) ([place])    GERD (gastroesophageal reflux disease)    Heart murmur    as a child   HLD (hyperlipidemia)     Tobacco History: Social History   Tobacco Use  Smoking Status Former Smoker   Types: Cigarettes   Quit date: 2013   Years since quitting: 8.6  Smokeless Tobacco Never Used   Counseling given: Not Answered   Outpatient Medications Prior to Visit  Medication Sig Dispense Refill   albuterol (PROVENTIL HFA) 108 (90 Base) MCG/ACT inhaler Inhale into the lungs. PRN     albuterol (PROVENTIL) (2.5 MG/3ML) 0.083% nebulizer solution PRN  7   azelastine (ASTELIN) 0.1 % nasal spray Place 2 sprays into both nostrils daily. Use in each nostril as directed 30 mL 2   cetirizine (ZYRTEC) 10 MG tablet Take 10 mg by mouth daily.     Fluocinolone Acetonide 0.01 % OIL Place 4 drops into both ears daily as needed. 20 mL 2   fluticasone (FLONASE) 50 MCG/ACT nasal spray Place 2 sprays into both nostrils daily.     magnesium oxide (MAG-OX) 400 MG tablet Take 400 mg by mouth daily.      metFORMIN (GLUCOPHAGE) 500 MG tablet Take 1 tablet (500 mg total) by mouth 2 (two) times daily with a meal. 180 tablet 1   Multiple Vitamin (MULTIVITAMIN) tablet Take 1 tablet by mouth daily.  pantoprazole (PROTONIX) 40 MG tablet Take 1 tablet (40 mg total) by mouth daily. 90 tablet 3   Probiotic Product (PROBIOTIC PO) Take 1 capsule by mouth 2 (two) times daily.      simvastatin (ZOCOR) 40 MG tablet Take 1 tablet (40 mg total) by mouth at bedtime. 90 tablet 3   sucralfate (CARAFATE) 1 g tablet Crush 1 tablet and dissolve in 44ml of warm water, mix well to create slurry 4(four) times daily. 120 tablet 1   budesonide-formoterol (SYMBICORT) 160-4.5 MCG/ACT inhaler Inhale 1 puff into the lungs at bedtime as needed. (Patient not taking: Reported on 10/08/2019) 1  Inhaler 3   celecoxib (CELEBREX) 200 MG capsule Take 1 capsule (200 mg total) by mouth 2 (two) times daily. 30 capsule 1   No facility-administered medications prior to visit.     Review of Systems:   Constitutional:   No  weight loss, night sweats,  Fevers, chills, fatigue, or  lassitude.  HEENT:   No headaches,  Difficulty swallowing,  Tooth/dental problems, or  Sore throat,                No sneezing, itching, ear ache, nasal congestion, post nasal drip,   CV:  No chest pain,  Orthopnea, PND, swelling in lower extremities, anasarca, dizziness, palpitations, syncope.   GI  No heartburn, indigestion, abdominal pain, nausea, vomiting, diarrhea, change in bowel habits, loss of appetite, bloody stools.   Resp: No shortness of breath with exertion or at rest.  No excess mucus, no productive cough,  No non-productive cough,  No coughing up of blood.  No change in color of mucus.  No wheezing.  No chest wall deformity  Skin: no rash or lesions.  GU: no dysuria, change in color of urine, no urgency or frequency.  No flank pain, no hematuria   MS:  No joint pain or swelling.  No decreased range of motion.  No back pain.    Physical Exam  BP 124/86 (BP Location: Left Arm, Cuff Size: Normal)    Pulse 72    Ht 5\' 4"  (1.626 m)    Wt 166 lb (75.3 kg)    SpO2 96%    BMI 28.49 kg/m   GEN: A/Ox3; pleasant , NAD, well nourished    HEENT:  [place]/AT,    NOSE-clear, THROAT-clear, no lesions, no postnasal drip or exudate noted.   NECK:  Supple w/ fair ROM; no JVD; normal carotid impulses w/o bruits; no thyromegaly or nodules palpated; no lymphadenopathy.    RESP  Clear  P & A; w/o, wheezes/ rales/ or rhonchi. no accessory muscle use, no dullness to percussion  CARD:  RRR, no m/r/g, no peripheral edema, pulses intact, no cyanosis or clubbing.  GI:   Soft & nt; nml bowel sounds; no organomegaly or masses detected.   Musco: Warm bil, no deformities or joint swelling noted.   Neuro: alert, no  focal deficits noted.    Skin: Warm, no lesions or rashes    Lab Results:   BNP No results found for: BNP  ProBNP No results found for: PROBNP  Imaging: No results found.    PFT Results Latest Ref Rng & Units 10/08/2019  FVC-Pre L 2.45  FVC-Predicted Pre % 78  FVC-Post L 2.51  FVC-Predicted Post % 80  Pre FEV1/FVC % % 81  Post FEV1/FCV % % 83  FEV1-Pre L 1.98  FEV1-Predicted Pre % 82  FEV1-Post L 2.08  DLCO uncorrected ml/min/mmHg 23.37  DLCO UNC% %  118  DLCO corrected ml/min/mmHg 23.37  DLCO COR %Predicted % 118  DLVA Predicted % 139  TLC L 4.91  TLC % Predicted % 97  RV % Predicted % 91    No results found for: NITRICOXIDE      Assessment & Plan:   Asthma History of eosinophilic asthma with severe symptoms in 2018 significant improvement after treatment with steroids, Symbicort and Fasenra.  She did receive Fasenra for only 3 months.  Was weaned off of steroids.  Had significant clinical benefit.  Pulmonary function testing today shows normal lung function.  Clinically she is much improved.  She has weaned herself off of Symbicort over the last 4 months and has had no flare off of ICS/LABA combo.  Have advised her on asthma action plan if symptoms return will need to restart Symbicort.  She is had no albuterol use.  Will check IgE and eosinophil level today she has had no recent steroid use.  She did have COVID-19 fall 2020 with no known sequela and chest x-ray in February 2021 showed clear lungs.  Plan  Patient Instructions  Labs today .  Albuterol inhaler As needed   If asthma symptoms return, can restart Symbicort . Call develops asthma symptoms ( cough , wheezing or shortness of breath , or increase albuterol use) .  Activity as tolerated.  Follow up with Dr. 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  No changes with Flonase or Zyrtec.     Rexene Edison, NP 10/08/2019

## 2019-10-08 NOTE — Patient Instructions (Addendum)
Labs today .  Albuterol inhaler As needed   If asthma symptoms return, can restart Symbicort . Call develops asthma symptoms ( cough , wheezing or shortness of breath , or increase albuterol use) .  Activity as tolerated.  Follow up with Dr. Melvyn Novas  In 6 months and As needed

## 2019-10-08 NOTE — Assessment & Plan Note (Signed)
Continue on preventative regimen.  No changes with Flonase or Zyrtec.

## 2019-10-08 NOTE — Progress Notes (Signed)
Full PFT completed today ? ?

## 2019-10-08 NOTE — Addendum Note (Signed)
Addended by: Suzzanne Cloud E on: 10/08/2019 10:34 AM   Modules accepted: Orders

## 2019-10-08 NOTE — Assessment & Plan Note (Signed)
History of eosinophilic asthma with severe symptoms in 2018 significant improvement after treatment with steroids, Symbicort and Fasenra.  She did receive Fasenra for only 3 months.  Was weaned off of steroids.  Had significant clinical benefit.  Pulmonary function testing today shows normal lung function.  Clinically she is much improved.  She has weaned herself off of Symbicort over the last 4 months and has had no flare off of ICS/LABA combo.  Have advised her on asthma action plan if symptoms return will need to restart Symbicort.  She is had no albuterol use.  Will check IgE and eosinophil level today she has had no recent steroid use.  She did have COVID-19 fall 2020 with no known sequela and chest x-ray in February 2021 showed clear lungs.  Plan  Patient Instructions  Labs today .  Albuterol inhaler As needed   If asthma symptoms return, can restart Symbicort . Call develops asthma symptoms ( cough , wheezing or shortness of breath , or increase albuterol use) .  Activity as tolerated.  Follow up with Dr. Melvyn Novas  In 6 months and As needed

## 2019-10-09 ENCOUNTER — Encounter: Payer: Self-pay | Admitting: Podiatry

## 2019-10-09 ENCOUNTER — Encounter: Payer: Self-pay | Admitting: Family Medicine

## 2019-10-09 DIAGNOSIS — A09 Infectious gastroenteritis and colitis, unspecified: Secondary | ICD-10-CM

## 2019-10-09 LAB — IGE: IgE (Immunoglobulin E), Serum: 112 kU/L (ref <=114)

## 2019-10-12 MED ORDER — CIPROFLOXACIN HCL 500 MG PO TABS: ORAL_TABLET | ORAL | 0 refills | Status: DC

## 2019-10-14 ENCOUNTER — Other Ambulatory Visit: Payer: Self-pay | Admitting: Obstetrics & Gynecology

## 2019-10-14 DIAGNOSIS — Z1231 Encounter for screening mammogram for malignant neoplasm of breast: Secondary | ICD-10-CM

## 2019-10-19 ENCOUNTER — Ambulatory Visit: Payer: Medicare Other | Admitting: Podiatry

## 2019-10-20 ENCOUNTER — Other Ambulatory Visit: Payer: Self-pay | Admitting: Family Medicine

## 2019-10-20 DIAGNOSIS — E119 Type 2 diabetes mellitus without complications: Secondary | ICD-10-CM

## 2019-10-21 ENCOUNTER — Ambulatory Visit
Admission: EM | Admit: 2019-10-21 | Discharge: 2019-10-21 | Disposition: A | Discharge status: Home / Self Care | Payer: Medicare Other | Attending: Physician Assistant | Admitting: Physician Assistant

## 2019-10-21 ENCOUNTER — Other Ambulatory Visit: Payer: Self-pay

## 2019-10-21 DIAGNOSIS — R0982 Postnasal drip: Secondary | ICD-10-CM

## 2019-10-21 DIAGNOSIS — H0100A Unspecified blepharitis right eye, upper and lower eyelids: Secondary | ICD-10-CM

## 2019-10-21 DIAGNOSIS — R21 Rash and other nonspecific skin eruption: Secondary | ICD-10-CM

## 2019-10-21 DIAGNOSIS — H0100B Unspecified blepharitis left eye, upper and lower eyelids: Secondary | ICD-10-CM | POA: Diagnosis not present

## 2019-10-21 MED ORDER — DEXAMETHASONE SODIUM PHOSPHATE 10 MG/ML IJ SOLN
10.0000 mg | Freq: Once | INTRAMUSCULAR | Status: AC
  Administered 2019-10-21: 10 mg via INTRAMUSCULAR

## 2019-10-21 NOTE — ED Triage Notes (Signed)
Pt c/o bilateal eye irritation, redness, since yesterday. Awoke this morning with dried exudate "eyes matted shut".  Also reports rash to bilateral lower legs, no itching. Denies fever, chills, n/v/d, abdominal pain, SOB, facial swelling, dysphagia, congestion, runny nose, HA. Pt reports she had negative COVID test on Saturday and flew back from [place] on Monday.

## 2019-10-21 NOTE — ED Provider Notes (Signed)
[name]    CSN: [id number] Arrival date & time: 10/21/19  1125      History   Chief Complaint Chief Complaint  Patient presents with  . Rash  . Eye Problem    HPI Alejandra Hess is a 64 y.o. female.   64 year old female comes in for 2 day history of eye irritation, redness. Today, woke up with eye crusted shut. States intermittent blurry vision that improves with artificial tears. Denies photophobia. Eyelid swelling this morning that hs resolved. Denies contact lens, glasses use. Has post nasal drip without other URI symptoms. Denies fever, shob, facial swelling, sore throat. Also woke up this morning with rash to bilateral legs. No itching, pain. Returned from [place] 2 days ago. Denies any new hygiene product changes.      Past Medical History:  Diagnosis Date  . Allergy   . Asthma   . DM (diabetes mellitus) ([place])   . GERD (gastroesophageal reflux disease)   . Heart murmur    as a child  . HLD (hyperlipidemia)     Patient Active Problem List   Diagnosis Date Noted  . Asthma 10/08/2019  . Hiatal hernia 08/16/2019  . Schatzki's ring 08/16/2019  . Gastritis 08/16/2019  . History of colonic polyps 08/16/2019  . Vasculitis ([place]) 05/04/2019  . History of COVID-19 04/03/2019  . 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/03/2019  . 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  . Chronic fatigue 01/20/2019  . Hot flashes 01/20/2019  . COVID-19 virus infection 12/12/2018  . Viral syndrome 12/08/2018  . Reactive airway disease 12/08/2018  . Glaucoma 11/24/2018  . Gastroesophageal reflux disease 11/21/2018  . H/O esophagitis 11/21/2018  . Elevated LDL cholesterol level 07/04/2018  . Controlled type 2 diabetes mellitus without complication, without long-term current use of insulin ([place]) 07/04/2018  . Health care maintenance 01/27/2018  . Asthmatic bronchitis 08/19/2017    Past Surgical History:  Procedure Laterality Date  . ABDOMINAL HYSTERECTOMY    . AUGMENTATION MAMMAPLASTY Bilateral      @ 43 lift with implants  . BREAST EXCISIONAL BIOPSY Right    @ 53  . BREAST EXCISIONAL BIOPSY Left    @43 ?  . CHOLECYSTECTOMY    . COLONOSCOPY    . UPPER GASTROINTESTINAL ENDOSCOPY      OB History    Gravida  4   Para  2   Term      Preterm      AB  2   Living  2     SAB  2   TAB      Ectopic      Multiple      Live Births               Home Medications    Prior to Admission medications   Medication Sig Start Date End Date Taking? Authorizing Provider  cetirizine (ZYRTEC) 10 MG tablet Take 10 mg by mouth daily.   Yes [provider]  fluticasone (FLONASE) 50 MCG/ACT nasal spray Place 2 sprays into both nostrils daily.   Yes [provider]  metFORMIN (GLUCOPHAGE) 500 MG tablet TAKE 1 TABLET BY MOUTH TWO TIMES DAILY  WITH A MEAL. 10/20/19  Yes [name], MD  pantoprazole (PROTONIX) 40 MG tablet Take 1 tablet (40 mg total) by mouth daily. 09/14/19  Yes Mansouraty, Telford Nab., MD  Probiotic Product (PROBIOTIC PO) Take 1 capsule by mouth 2 (two) times daily.    Yes [provider]  simvastatin (ZOCOR) 40 MG tablet  Take 1 tablet (40 mg total) by mouth at bedtime. 04/14/19  Yes [name], MD  sucralfate (CARAFATE) 1 g tablet Crush 1 tablet and dissolve in 40ml of warm water, mix well to create slurry 4(four) times daily. 09/09/19  Yes Mansouraty, Telford Nab., MD  albuterol (PROVENTIL HFA) 108 (90 Base) MCG/ACT inhaler Inhale into the lungs. PRN 05/18/16   [provider]  albuterol (PROVENTIL) (2.5 MG/3ML) 0.083% nebulizer solution PRN 07/19/17   [provider]  azelastine (ASTELIN) 0.1 % nasal spray Place 2 sprays into both nostrils daily. Use in each nostril as directed 04/03/19   [name], MD  budesonide-formoterol [facility]) 160-4.5 MCG/ACT inhaler Inhale 1 puff into the lungs at bedtime as needed. Patient not taking: Reported on 10/08/2019 03/03/18   [name], MD   ciprofloxacin (CIPRO) 500 MG tablet Take one tablet twice daily for 3 days as needed for diarrhea. 10/12/19   [name], MD  Fluocinolone Acetonide 0.01 % OIL Place 4 drops into both ears daily as needed. 01/27/18   [name], MD  magnesium oxide (MAG-OX) 400 MG tablet Take 400 mg by mouth daily.     [provider]  Multiple Vitamin (MULTIVITAMIN) tablet Take 1 tablet by mouth daily.    [provider]    Family History Family History  Problem Relation Age of Onset  . Hearing loss Mother   . Hyperlipidemia Mother   . Hypertension Mother   . Diabetes Mother   . Heart attack Father   . Heart disease Father   . Hyperlipidemia Father   . Hypertension Father   . Melanoma Father   . Asthma Sister   . Hyperlipidemia Sister   . Hypertension Sister   . Hyperlipidemia Sister   . Breast cancer Sister 65  . Asthma Sister   . Depression Sister   . Heart attack Sister   . Heart disease Sister   . Hypertension Sister   . Diabetes Sister   . Breast cancer Maternal Aunt        over 54  . Breast cancer Paternal Aunt        over 108  . Breast cancer Paternal Grandmother        over 62   . Breast cancer Maternal Aunt        over 62   . Colon cancer Maternal Grandfather   . Esophageal cancer Neg Hx   . Inflammatory bowel disease Neg Hx   . Liver disease Neg Hx   . Pancreatic cancer Neg Hx   . Rectal cancer Neg Hx   . Stomach cancer Neg Hx     Social History Social History   Tobacco Use  . Smoking status: Former Smoker    Types: Cigarettes    Quit date: 2013    Years since quitting: 8.6  . Smokeless tobacco: Never Used  Vaping Use  . Vaping Use: Never used  Substance Use Topics  . Alcohol use: Yes    Comment: 2 glasses of red wine daily  . Drug use: Never     Allergies   Azithromycin and Penicillins   Review of Systems Review of Systems  Reason unable to perform ROS: See HPI as above.     Physical Exam Triage Vital  Signs ED Triage Vitals  Enc Vitals Group     BP 10/21/19 1155 (!) 169/90     Pulse Rate 10/21/19 1155 67     Resp 10/21/19 1155 18  Temp 10/21/19 1155 98.2 F (36.8 C)     Temp Source 10/21/19 1155 Oral     SpO2 10/21/19 1155 94 %     Weight --      Height --      Head Circumference --      Peak Flow --      Pain Score 10/21/19 1218 4     Pain Loc --      Pain Edu? --      Excl. in [place]? --    No data found.  Updated Vital Signs BP (!) 169/90 (BP Location: Left Arm)   Pulse 67   Temp 98.2 F (36.8 C) (Oral)   Resp 18   SpO2 94%   Physical Exam Constitutional:      General: She is not in acute distress.    Appearance: Normal appearance. She is well-developed. She is not toxic-appearing or diaphoretic.  HENT:     Head: Normocephalic and atraumatic.  Eyes:     General: Lids are normal.     Pupils: Pupils are equal, round, and reactive to light.     Comments: Pinguecula bilaterally. Mild injection to the conjunctiva bilaterally. EOMI  Pulmonary:     Effort: Pulmonary effort is normal. No respiratory distress.  Musculoskeletal:     Cervical back: Normal range of motion and neck supple.  Skin:    General: Skin is warm and dry.     Comments: Maculopapular rash to bilateral lower legs. No erythema, warmth. No vesicular rashes. No calf swelling.   Neurological:     Mental Status: She is alert and oriented to person, place, and time.      UC Treatments / Results  Labs (all labs ordered are listed, but only abnormal results are displayed) Labs Reviewed - No data to display  EKG   Radiology No results found.  Procedures Procedures (including critical care time)  Medications Ordered in UC Medications  dexamethasone (DECADRON) injection 10 mg (has no administration in time range)    Initial Impression / Assessment and Plan / UC Course  I have reviewed the triage vital signs and the nursing notes.  Pertinent labs & imaging results that were available during  my care of the patient were reviewed by me and considered in my medical decision making (see chart for details).    Patient states with history of similar rash from allergic reactions. Requesting corticosteroid IM. History of DM, last a1c <6. With current history and exam, reasonable for corticosteroid IM. Decadron injection in office today. Lids scrubs and warm compress as directed. Return precautions given.  If patient with continued eye irritation, redness, eye crusting, okay to call in oflocacin 1 drop Q6H x 7 days.  Final Clinical Impressions(s) / UC Diagnoses   Final diagnoses:  Blepharitis of upper and lower eyelids of both eyes, unspecified type  Post-nasal drip  Rash    ED Prescriptions    None     PDMP not reviewed this encounter.   Ok Edwards, PA-C 10/21/19 1258

## 2019-10-21 NOTE — Discharge Instructions (Signed)
Decadron injection in office today. Restart azelastine for nasal congestion/post nasal spray. Lids scrubs and warm compress for eye symptoms. If eye symptoms not improving, now with worsening drainage, please give [place] a call for possible pink eye.

## 2019-10-23 ENCOUNTER — Ambulatory Visit (INDEPENDENT_AMBULATORY_CARE_PROVIDER_SITE_OTHER): Payer: Medicare Other | Admitting: Podiatry

## 2019-10-23 ENCOUNTER — Other Ambulatory Visit: Payer: Self-pay

## 2019-10-23 ENCOUNTER — Encounter: Payer: Self-pay | Admitting: Podiatry

## 2019-10-23 DIAGNOSIS — M722 Plantar fascial fibromatosis: Secondary | ICD-10-CM

## 2019-10-23 NOTE — Progress Notes (Signed)
LVM with results per Tammy Parrett.  Advised to call with any questions.

## 2019-10-26 ENCOUNTER — Ambulatory Visit: Payer: Medicare Other | Admitting: Gastroenterology

## 2019-10-26 NOTE — Progress Notes (Signed)
Subjective:   Patient ID: Alejandra Hess, female   DOB: 64 y.o.   MRN: [id number]   HPI Patient presents stating that both her heels have really been bothering her and making it hard to walk. Patient states that this is gotten worse recently and she was doing pretty well. Patient states she was very active and it seemed to start after that and was doing fine until   ROS      Objective:  Physical Exam  Neurovascular status intact with patient's heels quite sore in the medial band bilateral with inflammation worse when she gets up in the morning with what appears to be acute attack     Assessment:  Acute plantar fasciitis bilateral     Plan:  I have recommended that she does have an acute attack and her goal is to get this attack to a more reasonable level and I explained that to her and at this point I did go ahead and I did sterile prep and injected each fascia bilateral 3 mg Kenalog 5 mg Xylocaine I also went ahead and I dispensed a night splint that I want her to start wearing it and educated her on night splint usage and sleeping it for acute attacks evening usage and also ice therapy

## 2019-10-30 ENCOUNTER — Encounter: Payer: Self-pay | Admitting: Gastroenterology

## 2019-10-30 ENCOUNTER — Ambulatory Visit (INDEPENDENT_AMBULATORY_CARE_PROVIDER_SITE_OTHER): Payer: Medicare Other | Admitting: Gastroenterology

## 2019-10-30 VITALS — BP 138/78 | HR 73 | Ht 64.0 in | Wt 165.5 lb

## 2019-10-30 DIAGNOSIS — R12 Heartburn: Secondary | ICD-10-CM | POA: Diagnosis not present

## 2019-10-30 DIAGNOSIS — K449 Diaphragmatic hernia without obstruction or gangrene: Secondary | ICD-10-CM

## 2019-10-30 DIAGNOSIS — K219 Gastro-esophageal reflux disease without esophagitis: Secondary | ICD-10-CM | POA: Diagnosis not present

## 2019-10-30 MED ORDER — FAMOTIDINE 20 MG PO TABS: 20.0000 mg | ORAL_TABLET | Freq: Every day | ORAL | 2 refills | Status: DC

## 2019-10-30 NOTE — Progress Notes (Signed)
P  Chief Complaint:    GERD  GI History:  Carnelia Oscar is a 64 year old female referred to me by Dr. Rush Landmark for evaluation of possible antireflux intervention with Transoral Incisionless Fundoplication (TIF) with a goal to stop or significantly reduce acid suppression therapy.   GERD history: -Index symptoms: HB, sinus congestion, sore throat, nocturnal cough -Exacerbating features: Nocturnal (cough, HB), post prandial -Medications trialed: Dexilant, Domperidone, omeprazole, Zantac, Pepcid  -Current medications: Carafate, Protonix 40 mg QAM, Prilosec 40 mg qhs, Tums (5-6/day); HOB elevation -Complications: None  GERD evaluation: -Last EGD: 07/2019 -Barium esophagram: None -Esophageal Manometry: Scheduled later this month -pH/Impedance: Scheduled for later this month -Bravo: N/A  Endoscopic History: -EGD (05/2019, Dr. Leonia Reeves): Schatzki's ring, small <2 cm hiatal hernia, normal Z-line, gastric polyps  GERD-HRQL Questionnaire Score: 32/50 (on PPI) and marked "dissatisfied" with overall satisfaction of current health condition    HPI:     Patient is a 64 y.o. female presenting to the [facility] for evaluation of antireflux surgery (TIF) as a means of controlling GERD and potentially stopping or significantly reducing need for acid suppression therapy.  Reflux history as outlined above.  Was treated with Dexilant and Carafate  x3 weeks without any change, so changed to a combination of pantoprazole 40 mg in the morning and omeprazole 40 mg in the evening.  Notes some improvement with this regimen, but still with breakthrough symptoms.  Interestingly, reports adult dx of asthma.   No n/v.  No dysphagia.  Review of systems:     No chest pain, no SOB, no fevers, no urinary sx   Past Medical History:  Diagnosis Date  . Allergy   . Asthma   . DM (diabetes mellitus) ([place])   . GERD (gastroesophageal reflux disease)   . Heart murmur    as a child  .  HLD (hyperlipidemia)     Patient's surgical history, family medical history, social history, medications and allergies were all reviewed in Epic    Current Outpatient Medications  Medication Sig Dispense Refill  . albuterol (PROVENTIL HFA) 108 (90 Base) MCG/ACT inhaler Inhale into the lungs. PRN    . albuterol (PROVENTIL) (2.5 MG/3ML) 0.083% nebulizer solution PRN  7  . budesonide-formoterol (SYMBICORT) 160-4.5 MCG/ACT inhaler Inhale 1 puff into the lungs at bedtime as needed. 1 Inhaler 3  . cetirizine (ZYRTEC) 10 MG tablet Take 10 mg by mouth daily.    . Fluocinolone Acetonide 0.01 % OIL Place 4 drops into both ears daily as needed. 20 mL 2  . magnesium oxide (MAG-OX) 400 MG tablet Take 400 mg by mouth daily.     . metFORMIN (GLUCOPHAGE) 500 MG tablet TAKE 1 TABLET BY MOUTH TWO TIMES DAILY  WITH A MEAL. 180 tablet 1  . Multiple Vitamin (MULTIVITAMIN) tablet Take 1 tablet by mouth daily.    [name] omeprazole (PRILOSEC) 40 MG capsule Take 40 mg by mouth daily.    . pantoprazole (PROTONIX) 40 MG tablet Take 1 tablet (40 mg total) by mouth daily. 90 tablet 3  . Probiotic Product (PROBIOTIC PO) Take 1 capsule by mouth 2 (two) times daily.     . simvastatin (ZOCOR) 40 MG tablet Take 1 tablet (40 mg total) by mouth at bedtime. 90 tablet 3  . sucralfate (CARAFATE) 1 g tablet Crush 1 tablet and dissolve in 61ml of warm water, mix well to create slurry 4(four) times daily. 120 tablet 1  . triamcinolone (NASACORT ALLERGY 24HR) 55 MCG/ACT AERO nasal inhaler Place 1  spray into the nose daily.     No current facility-administered medications for this visit.    Physical Exam:     BP 138/78   Pulse 73   Ht 5\' 4"  (1.626 m)   Wt 165 lb 8 oz (75.1 kg)   BMI 28.41 kg/m   GENERAL:  Pleasant female in NAD PSYCH: : Cooperative, normal affect Musculoskeletal:  Normal muscle tone, normal strength NEURO: Alert and oriented x 3, no focal neurologic deficits   IMPRESSION and PLAN:    1) GERD 2)  Heartburn 3) Hiatal hernia  -Can continue dual agent PPI for now, but will certainly look to consolidate in the future. -Change Prilosec to take prior to dinner.  Taking pantoprazole appropriately prior to breakfast/coffee -Add Pepcid 20 mg qhs -Scheduled for esophageal manometry and pH/impedance (off PPI) at the end of the month -To follow-up after completion of studies and if notable for significant esophageal acid exposure, can plan to proceed to TIF -Discussed antireflux surgical options at length today, to include Transoral Incisionless Fundoplication (TIF) as well as Nissen fundoplication, etc. Pending studies as above, she would like to proceed with TIF -Discussed the postoperative dietary and activity/exercise modifications  I spent 35 minutes of time, including in depth chart review, independent review of results as outlined above, communicating results with the patient directly, face-to-face time with the patient, coordinating care, and ordering studies and medications as appropriate, and documentation.       Lavena Bullion ,DO, FACG 10/30/2019, 1:49 PM

## 2019-10-30 NOTE — Patient Instructions (Addendum)
If you are age 66 or older, your body mass index should be between 23-30. Your Body mass index is 28.41 kg/m. If this is out of the aforementioned range listed, please consider follow up with your Primary Care Provider.  If you are age 66 or younger, your body mass index should be between 19-25. Your Body mass index is 28.41 kg/m. If this is out of the aformentioned range listed, please consider follow up with your Primary Care Provider.   We have sent the following medications to your pharmacy for you to pick up at your convenience: Pepcid 20 mg at bedtime.  Move Prilosec to pre-dinner.  It was a pleasure to see you today!  Vito Cirigliano, D.O.

## 2019-10-31 ENCOUNTER — Other Ambulatory Visit (HOSPITAL_COMMUNITY): Payer: Medicare Other

## 2019-11-09 ENCOUNTER — Telehealth: Payer: Self-pay | Admitting: Gastroenterology

## 2019-11-10 NOTE — Telephone Encounter (Signed)
Yesi, I didn't schedule this pt. I think this was scheduled in [place]. I will send to [facility].

## 2019-11-10 NOTE — Telephone Encounter (Signed)
Patient rescheduled for 11/16/19 for covid testing

## 2019-11-12 ENCOUNTER — Ambulatory Visit: Payer: Medicare Other

## 2019-11-14 ENCOUNTER — Other Ambulatory Visit (HOSPITAL_COMMUNITY): Payer: Medicare Other

## 2019-11-16 ENCOUNTER — Other Ambulatory Visit (HOSPITAL_COMMUNITY)
Admission: RE | Admit: 2019-11-16 | Discharge: 2019-11-16 | Disposition: A | Discharge status: Home / Self Care | Payer: Medicare Other | Source: Ambulatory Visit | Attending: Gastroenterology | Admitting: Gastroenterology

## 2019-11-16 DIAGNOSIS — Z01812 Encounter for preprocedural laboratory examination: Secondary | ICD-10-CM | POA: Insufficient documentation

## 2019-11-16 DIAGNOSIS — Z20822 Contact with and (suspected) exposure to covid-19: Secondary | ICD-10-CM | POA: Insufficient documentation

## 2019-11-16 LAB — SARS CORONAVIRUS 2 (TAT 6-24 HRS): SARS Coronavirus 2: NEGATIVE

## 2019-11-18 ENCOUNTER — Encounter (HOSPITAL_COMMUNITY)
Admission: RE | Disposition: A | Discharge status: Home / Self Care | Payer: Self-pay | Source: Home / Self Care | Attending: Gastroenterology

## 2019-11-18 ENCOUNTER — Ambulatory Visit (HOSPITAL_COMMUNITY)
Admission: RE | Admit: 2019-11-18 | Discharge: 2019-11-18 | Disposition: A | Discharge status: Home / Self Care | Payer: Medicare Other | Attending: Gastroenterology | Admitting: Gastroenterology

## 2019-11-18 DIAGNOSIS — K219 Gastro-esophageal reflux disease without esophagitis: Secondary | ICD-10-CM

## 2019-11-18 DIAGNOSIS — R05 Cough: Secondary | ICD-10-CM | POA: Insufficient documentation

## 2019-11-18 DIAGNOSIS — R12 Heartburn: Secondary | ICD-10-CM | POA: Diagnosis not present

## 2019-11-18 DIAGNOSIS — R053 Chronic cough: Secondary | ICD-10-CM

## 2019-11-18 HISTORY — PX: ESOPHAGEAL MANOMETRY: SHX5429

## 2019-11-18 HISTORY — PX: 24 HOUR PH STUDY: SHX5419

## 2019-11-18 SURGERY — MANOMETRY, ESOPHAGUS

## 2019-11-18 MED ORDER — LIDOCAINE VISCOUS HCL 2 % MT SOLN
OROMUCOSAL | Status: AC
  Filled 2019-11-18: qty 15

## 2019-11-18 SURGICAL SUPPLY — 2 items
FACESHIELD LNG OPTICON STERILE (SAFETY) IMPLANT
GLOVE BIO SURGEON STRL SZ8 (GLOVE) ×4 IMPLANT

## 2019-11-18 NOTE — Progress Notes (Signed)
Esophageal manometry performed per protocol.  Patient tolerated procedure without any diffuculties.  PH probe then placed at 34 cm at left nare.  Written and verbal education provided on use of equipment and when to return to Endoscopy to have probe removed.  Patient verbalized understanding of all instructions.  Report to be sent to Dr. Harl Bowie.

## 2019-11-19 ENCOUNTER — Encounter (HOSPITAL_COMMUNITY): Payer: Self-pay | Admitting: Gastroenterology

## 2019-11-26 DIAGNOSIS — R053 Chronic cough: Secondary | ICD-10-CM

## 2019-11-26 DIAGNOSIS — R12 Heartburn: Secondary | ICD-10-CM

## 2019-11-27 DIAGNOSIS — Z23 Encounter for immunization: Secondary | ICD-10-CM | POA: Diagnosis not present

## 2019-12-07 NOTE — Progress Notes (Signed)
Subjective:   Alejandra Hess is a 64 y.o. female who presents for an Initial Medicare Annual Wellness Visit.  I connected with Alejandra Hess today by telephone and verified that I am speaking with the correct person using two identifiers. Location patient: home Location provider: work Persons participating in the virtual visit: patient, Marine scientist.    I discussed the limitations, risks, security and privacy concerns of performing an evaluation and management service by telephone and the availability of in person appointments. I also discussed with the patient that there may be a patient responsible charge related to this service. The patient expressed understanding and verbally consented to this telephonic visit.    Interactive audio and video telecommunications were attempted between this provider and patient, however failed, due to patient having technical difficulties OR patient did not have access to video capability.  We continued and completed visit with audio only.  Some vital signs may be absent or patient reported.   Time Spent with patient on telephone encounter: 20 minutes  Review of Systems     Cardiac Risk Factors include: advanced age (>75men, >64 women);diabetes mellitus     Objective:    Today's Vitals   12/08/19 1031  Weight: 165 lb (74.8 kg)  Height: 5\' 4"  (1.626 m)   Body mass index is 28.32 kg/m.  Advanced Directives 12/08/2019  Does Patient Have a Medical Advance Directive? Yes  Type of Paramedic of [place];Living will  Copy of [place] in Chart? No - copy requested    Current Medications (verified) Outpatient Encounter Medications as of 12/08/2019  Medication Sig  . albuterol (PROVENTIL HFA) 108 (90 Base) MCG/ACT inhaler Inhale into the lungs. PRN  . albuterol (PROVENTIL) (2.5 MG/3ML) 0.083% nebulizer solution PRN  . budesonide-formoterol (SYMBICORT) 160-4.5 MCG/ACT inhaler Inhale 1 puff into the lungs at bedtime  as needed.  . cetirizine (ZYRTEC) 10 MG tablet Take 10 mg by mouth daily.  . famotidine (PEPCID) 20 MG tablet Take 1 tablet (20 mg total) by mouth at bedtime.  . Fluocinolone Acetonide 0.01 % OIL Place 4 drops into both ears daily as needed.  . magnesium oxide (MAG-OX) 400 MG tablet Take 400 mg by mouth daily.   . metFORMIN (GLUCOPHAGE) 500 MG tablet TAKE 1 TABLET BY MOUTH TWO TIMES DAILY  WITH A MEAL.  . Multiple Vitamin (MULTIVITAMIN) tablet Take 1 tablet by mouth daily.  [name] omeprazole (PRILOSEC) 40 MG capsule Take 40 mg by mouth daily.  . pantoprazole (PROTONIX) 40 MG tablet Take 1 tablet (40 mg total) by mouth daily.  . Probiotic Product (PROBIOTIC PO) Take 1 capsule by mouth 2 (two) times daily.   . simvastatin (ZOCOR) 40 MG tablet Take 1 tablet (40 mg total) by mouth at bedtime.  . triamcinolone (NASACORT ALLERGY 24HR) 55 MCG/ACT AERO nasal inhaler Place 1 spray into the nose daily.  . sucralfate (CARAFATE) 1 g tablet Crush 1 tablet and dissolve in 108ml of warm water, mix well to create slurry 4(four) times daily. (Patient not taking: Reported on 12/08/2019)   No facility-administered encounter medications on file as of 12/08/2019.    Allergies (verified) Azithromycin and Penicillins   History: Past Medical History:  Diagnosis Date  . Allergy   . Asthma   . DM (diabetes mellitus) ([place])   . GERD (gastroesophageal reflux disease)   . Heart murmur    as a child  . HLD (hyperlipidemia)    Past Surgical History:  Procedure Laterality Date  . 24  HOUR [place] STUDY N/A 11/18/2019   Procedure: 24 HOUR PH STUDY;  Surgeon: Rush Landmark Telford Nab., MD;  Location: [facility];  Service: Gastroenterology;  Laterality: N/A;  . ABDOMINAL HYSTERECTOMY    . AUGMENTATION MAMMAPLASTY Bilateral    @ 43 lift with implants  . BREAST EXCISIONAL BIOPSY Right    @ 53  . BREAST EXCISIONAL BIOPSY Left    @43 ?  . CHOLECYSTECTOMY    . COLONOSCOPY    . ESOPHAGEAL MANOMETRY N/A 11/18/2019   Procedure:  ESOPHAGEAL MANOMETRY (EM);  Surgeon: Irving Copas., MD;  Location: [facility];  Service: Gastroenterology;  Laterality: N/A;  . UPPER GASTROINTESTINAL ENDOSCOPY     Family History  Problem Relation Age of Onset  . Hearing loss Mother   . Hyperlipidemia Mother   . Hypertension Mother   . Diabetes Mother   . Heart attack Father   . Heart disease Father   . Hyperlipidemia Father   . Hypertension Father   . Melanoma Father   . Asthma Sister   . Hyperlipidemia Sister   . Hypertension Sister   . Hyperlipidemia Sister   . Breast cancer Sister 45  . Asthma Sister   . Depression Sister   . Heart attack Sister   . Heart disease Sister   . Hypertension Sister   . Diabetes Sister   . Breast cancer Maternal Aunt        over 27  . Breast cancer Paternal Aunt        over 64  . Breast cancer Paternal Grandmother        over 57   . Breast cancer Maternal Aunt        over 57   . Colon cancer Maternal Grandfather   . Esophageal cancer Neg Hx   . Inflammatory bowel disease Neg Hx   . Liver disease Neg Hx   . Pancreatic cancer Neg Hx   . Rectal cancer Neg Hx   . Stomach cancer Neg Hx    Social History   Socioeconomic History  . Marital status: Married    Spouse name: Not on file  . Number of children: 2  . Years of education: Not on file  . Highest education level: Not on file  Occupational History  . Occupation: Therapist, sports  Tobacco Use  . Smoking status: Former Smoker    Types: Cigarettes    Quit date: 2013    Years since quitting: 8.7  . Smokeless tobacco: Never Used  Vaping Use  . Vaping Use: Never used  Substance and Sexual Activity  . Alcohol use: Yes    Comment: 2 glasses of red wine daily  . Drug use: Never  . Sexual activity: Yes    Partners: Male    Comment: 1st intercourse- 62, partners- 5-, MARRIED- 36  Other Topics Concern  . Not on file  Social History Narrative  . Not on file   Social Determinants of Health   Financial Resource Strain: Low Risk     . Difficulty of Paying Living Expenses: Not hard at all  Food Insecurity: No Food Insecurity  . Worried About Charity fundraiser in the Last Year: Never true  . Ran Out of Food in the Last Year: Never true  Transportation Needs: No Transportation Needs  . Lack of Transportation (Medical): No  . Lack of Transportation (Non-Medical): No  Physical Activity: Insufficiently Active  . Days of Exercise per Week: 3 days  . Minutes of Exercise per Session: 30 min  Stress: No Stress Concern Present  . Feeling of Stress : Not at all  Social Connections: Moderately Isolated  . Frequency of Communication with Friends and Family: More than three times a week  . Frequency of Social Gatherings with Friends and Family: Once a week  . Attends Religious Services: Never  . Active Member of Clubs or Organizations: No  . Attends Archivist Meetings: Never  . Marital Status: Married    Tobacco Counseling Counseling given: Not Answered   Clinical Intake:  Pre-visit preparation completed: Yes  Pain : No/denies pain     Nutritional Status: BMI 25 -29 Overweight Nutritional Risks: None Diabetes: Yes CBG done?: No Did pt. bring in CBG monitor from home?: No (phone visit)  How often do you need to have someone help you when you read instructions, pamphlets, or other written materials from your doctor or pharmacy?: 1 - Never What is the last grade level you completed in school?: Master's degree  Diabetes:  Is the patient diabetic?  Yes  If diabetic, was a CBG obtained today?  No  Did the patient bring in their glucometer from home?  No phone vieit How often do you monitor your CBG's? never.   Financial Strains and Diabetes Management:  Are you having any financial strains with the device, your supplies or your medication? No .  Does the patient want to be seen by Chronic Care Management for management of their diabetes?  No  Would the patient like to be referred to a Nutritionist  or for Diabetic Management?  No   Diabetic Exams:  Diabetic Eye Exam:  Overdue for diabetic eye exam. Pt has been advised about the importance in completing this exam. Patient to schedule appt.  Diabetic Foot Exam: Pt has been advised about the importance in completing this exam. PCP to complete   Interpreter Needed?: No  Information entered by :: Caroleen Hamman LPN   Activities of Daily Living In your present state of health, do you have any difficulty performing the following activities: 12/08/2019  Hearing? N  Vision? N  Difficulty concentrating or making decisions? N  Walking or climbing stairs? N  Dressing or bathing? N  Doing errands, shopping? N  Preparing Food and eating ? N  Using the Toilet? N  In the past six months, have you accidently leaked urine? N  Do you have problems with loss of bowel control? N  Managing your Medications? N  Managing your Finances? N  Housekeeping or managing your Housekeeping? N  Some recent data might be hidden    Patient Care Team: [name], MD as PCP - General (Family Medicine)  Indicate any recent Medical Services you may have received from other than [facility] providers in the past year (date may be approximate).     Assessment:   This is a routine wellness examination for Alejandra Hess.  Hearing/Vision screen  Hearing Screening   125Hz  250Hz  500Hz  1000Hz  2000Hz  3000Hz  4000Hz  6000Hz  8000Hz   Right ear:           Left ear:           Comments: No issues  Vision Screening Comments: Reading glasses  Dietary issues and exercise activities discussed: Current Exercise Habits: Home exercise routine, Time (Minutes): 30, Frequency (Times/Week): 3, Weekly Exercise (Minutes/Week): 90, Exercise limited by: None identified  Goals    . Patient Stated     Maintain current level of activity      Depression Screen PHQ 2/9 Scores 12/08/2019  04/03/2019  PHQ - 2 Score 0 0    Fall Risk Fall Risk  12/08/2019 04/03/2019  Falls in the  past year? 0 0  Number falls in past yr: 0 -  Injury with Fall? 0 -  Follow up Falls prevention discussed -    Any stairs in or around the home? Yes  If so, are there any without handrails? No  Home free of loose throw rugs in walkways, pet beds, electrical cords, etc? Yes  Adequate lighting in your home to reduce risk of falls? Yes   ASSISTIVE DEVICES UTILIZED TO PREVENT FALLS:  Life alert? No  Use of a cane, walker or w/c? No  Grab bars in the bathroom? No  Shower chair or bench in shower? No  Elevated toilet seat or a handicapped toilet? No   TIMED UP AND GO:  Was the test performed? No . Phone visit   Cognitive Function:No cognitive impairment noted        Immunizations Immunization History  Administered Date(s) Administered  . Fluad Quad(high Dose 65+) 10/21/2018  . Influenza Inj Mdck Quad Pf 12/10/2017  . Influenza-Unspecified 09/26/2016  . PFIZER SARS-COV-2 Vaccination 04/04/2019, 04/29/2019, 12/01/2019  . PPD Test 07/24/2016  . Pneumococcal Polysaccharide-23 10/21/2018    TDAP status: Due, Education has been provided regarding the importance of this vaccine. Advised may receive this vaccine at local pharmacy or Health Dept. Aware to provide a copy of the vaccination record if obtained from local pharmacy or Health Dept. Verbalized acceptance and understanding.   Flu vaccine status: Due- Patient plans to get vaccine next week.  Pneumoccoccal vaccine status: Due for Prevnar-13-Patient plans to discuss with PCP.  Covid-19 vaccine status: Completed vaccines  Qualifies for Shingles Vaccine? Yes   Zostavax completed No   Shingrix Completed?: No.    Education has been provided regarding the importance of this vaccine. Patient has been advised to call insurance company to determine out of pocket expense if they have not yet received this vaccine. Advised may also receive vaccine at local pharmacy or Health Dept. Verbalized acceptance and understanding.  Screening  Tests Health Maintenance  Topic Date Due  . TETANUS/TDAP  Never done  . INFLUENZA VACCINE  09/27/2019  . HEMOGLOBIN A1C  10/11/2019  . FOOT EXAM  10/21/2019  . PNA vac Low Risk Adult (2 of 2 - PCV13) 10/21/2019  . OPHTHALMOLOGY EXAM  11/17/2019  . URINE MICROALBUMIN  01/20/2020  . MAMMOGRAM  10/22/2020  . COLONOSCOPY  05/02/2025  . DEXA SCAN  Completed  . COVID-19 Vaccine  Completed  . Hepatitis C Screening  Completed    Health Maintenance  Health Maintenance Due  Topic Date Due  . TETANUS/TDAP  Never done  . INFLUENZA VACCINE  09/27/2019  . HEMOGLOBIN A1C  10/11/2019  . FOOT EXAM  10/21/2019  . PNA vac Low Risk Adult (2 of 2 - PCV13) 10/21/2019  . OPHTHALMOLOGY EXAM  11/17/2019  . URINE MICROALBUMIN  01/20/2020    Colorectal cancer screening: Completed 05/03/2015. Repeat every 10 years   Mammogram Status: Scheduled for 12/08/2019  Bone Density status: Completed 10/30/2018. Results reflect: Bone density results: OSTEOPOROSIS. Repeat every 2 years.  Lung Cancer Screening: (Low Dose CT Chest recommended if Age 42-80 years, 30 pack-year currently smoking OR have quit w/in 15years.) does not qualify.     Additional Screening:  Hepatitis C Screening:Completed 03/26/2014  Vision Screening: Recommended annual ophthalmology exams for early detection of glaucoma and other disorders of the eye. Is the patient  up to date with their annual eye exam?  No  Who is the provider or what is the name of the office in which the patient attends annual eye exams? Unsure of name Patient to schedule appt   Dental Screening: Recommended annual dental exams for proper oral hygiene  Community Resource Referral / Chronic Care Management: CRR required this visit?  No   CCM required this visit?  No      Plan:     I have personally reviewed and noted the following in the patient's chart:   . Medical and social history . Use of alcohol, tobacco or illicit drugs  . Current medications and  supplements . Functional ability and status . Nutritional status . Physical activity . Advanced directives . List of other physicians . Hospitalizations, surgeries, and ER visits in previous 12 months . Vitals . Screenings to include cognitive, depression, and falls . Referrals and appointments  In addition, I have reviewed and discussed with patient certain preventive protocols, quality metrics, and best practice recommendations. A written personalized care plan for preventive services as well as general preventive health recommendations were provided to patient.    Due to this being a telephonic visit, the after visit summary with patients personalized plan was offered to patient via mail or my-chart. Patient would like to access on my-chart.   Marta Antu, LPN   [id number]  Nurse Health Advisor  Nurse Notes: None

## 2019-12-08 ENCOUNTER — Telehealth: Payer: Self-pay | Admitting: Family Medicine

## 2019-12-08 ENCOUNTER — Other Ambulatory Visit: Payer: Self-pay

## 2019-12-08 ENCOUNTER — Encounter: Payer: Medicare Other | Admitting: Obstetrics & Gynecology

## 2019-12-08 ENCOUNTER — Ambulatory Visit (INDEPENDENT_AMBULATORY_CARE_PROVIDER_SITE_OTHER): Payer: Medicare Other | Admitting: Obstetrics & Gynecology

## 2019-12-08 ENCOUNTER — Ambulatory Visit
Admission: RE | Admit: 2019-12-08 | Discharge: 2019-12-08 | Disposition: A | Discharge status: Home / Self Care | Payer: Medicare Other | Source: Ambulatory Visit

## 2019-12-08 ENCOUNTER — Encounter: Payer: Self-pay | Admitting: Obstetrics & Gynecology

## 2019-12-08 ENCOUNTER — Ambulatory Visit (INDEPENDENT_AMBULATORY_CARE_PROVIDER_SITE_OTHER): Payer: Medicare Other

## 2019-12-08 VITALS — BP 136/84 | Ht 63.0 in | Wt 165.0 lb

## 2019-12-08 VITALS — Ht 64.0 in | Wt 165.0 lb

## 2019-12-08 DIAGNOSIS — Z1231 Encounter for screening mammogram for malignant neoplasm of breast: Secondary | ICD-10-CM

## 2019-12-08 DIAGNOSIS — Z01419 Encounter for gynecological examination (general) (routine) without abnormal findings: Secondary | ICD-10-CM | POA: Diagnosis not present

## 2019-12-08 DIAGNOSIS — R140 Abdominal distension (gaseous): Secondary | ICD-10-CM

## 2019-12-08 DIAGNOSIS — Z0000 Encounter for general adult medical examination without abnormal findings: Secondary | ICD-10-CM | POA: Diagnosis not present

## 2019-12-08 DIAGNOSIS — Z780 Asymptomatic menopausal state: Secondary | ICD-10-CM

## 2019-12-08 DIAGNOSIS — M810 Age-related osteoporosis without current pathological fracture: Secondary | ICD-10-CM

## 2019-12-08 DIAGNOSIS — Z90710 Acquired absence of both cervix and uterus: Secondary | ICD-10-CM

## 2019-12-08 DIAGNOSIS — E663 Overweight: Secondary | ICD-10-CM | POA: Diagnosis not present

## 2019-12-08 DIAGNOSIS — Z9189 Other specified personal risk factors, not elsewhere classified: Secondary | ICD-10-CM

## 2019-12-08 NOTE — Telephone Encounter (Signed)
Please disregard. Patient was scheduled in a cancellation slot for tomorrow.

## 2019-12-08 NOTE — Telephone Encounter (Signed)
Patient wants to be seen for follow up on diabetes and have her A1C checked. Dr. Ethelene Hal has no availability. Patient is requesting to be seen by another provider for this. Please advise if this is okay.

## 2019-12-08 NOTE — Patient Instructions (Signed)
Alejandra Hess , Thank you for taking time to complete your Medicare Wellness Visit. I appreciate your ongoing commitment to your health goals. Please review the following plan we discussed and let me know if I can assist you in the future.   Screening recommendations/referrals: Colonoscopy: Completed 05/03/2015- Due-05/02/2025 Mammogram: Scheduled for today. Bone Density: Completed 10/30/2018- Due 10/29/2020 Recommended yearly ophthalmology/optometry visit for glaucoma screening and checkup Recommended yearly dental visit for hygiene and checkup  Vaccinations: Influenza vaccine: Due- May obtain vaccine at our office or your local pharmacy Pneumococcal vaccine: Due for Prevnar-13 May obtain vaccine at our office or your local pharmacy Tdap vaccine: Discuss with pharmacy Shingles vaccine: Discuss with pharmacy   Covid-19:Completed vaccines  Advanced directives: Please bring a copy for your chart  Conditions/risks identified: See problem list  Next appointment: Follow up in one year for your annual wellness visit    Preventive Care 65 Years and Older, Female Preventive care refers to lifestyle choices and visits with your health care provider that can promote health and wellness. What does preventive care include?  A yearly physical exam. This is also called an annual well check.  Dental exams once or twice a year.  Routine eye exams. Ask your health care provider how often you should have your eyes checked.  Personal lifestyle choices, including:  Daily care of your teeth and gums.  Regular physical activity.  Eating a healthy diet.  Avoiding tobacco and drug use.  Limiting alcohol use.  Practicing safe sex.  Taking low-dose aspirin every day.  Taking vitamin and mineral supplements as recommended by your health care provider. What happens during an annual well check? The services and screenings done by your health care provider during your annual well check will depend on  your age, overall health, lifestyle risk factors, and family history of disease. Counseling  Your health care provider may ask you questions about your:  Alcohol use.  Tobacco use.  Drug use.  Emotional well-being.  Home and relationship well-being.  Sexual activity.  Eating habits.  History of falls.  Memory and ability to understand (cognition).  Work and work Statistician.  Reproductive health. Screening  You may have the following tests or measurements:  Height, weight, and BMI.  Blood pressure.  Lipid and cholesterol levels. These may be checked every 5 years, or more frequently if you are over 75 years old.  Skin check.  Lung cancer screening. You may have this screening every year starting at age 30 if you have a 30-pack-year history of smoking and currently smoke or have quit within the past 15 years.  Fecal occult blood test (FOBT) of the stool. You may have this test every year starting at age 73.  Flexible sigmoidoscopy or colonoscopy. You may have a sigmoidoscopy every 5 years or a colonoscopy every 10 years starting at age 8.  Hepatitis C blood test.  Hepatitis B blood test.  Sexually transmitted disease (STD) testing.  Diabetes screening. This is done by checking your blood sugar (glucose) after you have not eaten for a while (fasting). You may have this done every 1-3 years.  Bone density scan. This is done to screen for osteoporosis. You may have this done starting at age 69.  Mammogram. This may be done every 1-2 years. Talk to your health care provider about how often you should have regular mammograms. Talk with your health care provider about your test results, treatment options, and if necessary, the need for more tests. Vaccines  Your  health care provider may recommend certain vaccines, such as:  Influenza vaccine. This is recommended every year.  Tetanus, diphtheria, and acellular pertussis (Tdap, Td) vaccine. You may need a Td booster  every 10 years.  Zoster vaccine. You may need this after age 40.  Pneumococcal 13-valent conjugate (PCV13) vaccine. One dose is recommended after age 13.  Pneumococcal polysaccharide (PPSV23) vaccine. One dose is recommended after age 36. Talk to your health care provider about which screenings and vaccines you need and how often you need them. This information is not intended to replace advice given to you by your health care provider. Make sure you discuss any questions you have with your health care provider. Document Released: 03/11/2015 Document Revised: 11/02/2015 Document Reviewed: 12/14/2014 Elsevier Interactive Patient Education  2017 [employer] Prevention in the Home Falls can cause injuries. They can happen to people of all ages. There are many things you can do to make your home safe and to help prevent falls. What can I do on the outside of my home?  Regularly fix the edges of walkways and driveways and fix any cracks.  Remove anything that might make you trip as you walk through a door, such as a raised step or threshold.  Trim any bushes or trees on the path to your home.  Use bright outdoor lighting.  Clear any walking paths of anything that might make someone trip, such as rocks or tools.  Regularly check to see if handrails are loose or broken. Make sure that both sides of any steps have handrails.  Any raised decks and porches should have guardrails on the edges.  Have any leaves, snow, or ice cleared regularly.  Use sand or salt on walking paths during winter.  Clean up any spills in your garage right away. This includes oil or grease spills. What can I do in the bathroom?  Use night lights.  Install grab bars by the toilet and in the tub and shower. Do not use towel bars as grab bars.  Use non-skid mats or decals in the tub or shower.  If you need to sit down in the shower, use a plastic, non-slip stool.  Keep the floor dry. Clean up any  water that spills on the floor as soon as it happens.  Remove soap buildup in the tub or shower regularly.  Attach bath mats securely with double-sided non-slip rug tape.  Do not have throw rugs and other things on the floor that can make you trip. What can I do in the bedroom?  Use night lights.  Make sure that you have a light by your bed that is easy to reach.  Do not use any sheets or blankets that are too big for your bed. They should not hang down onto the floor.  Have a firm chair that has side arms. You can use this for support while you get dressed.  Do not have throw rugs and other things on the floor that can make you trip. What can I do in the kitchen?  Clean up any spills right away.  Avoid walking on wet floors.  Keep items that you use a lot in easy-to-reach places.  If you need to reach something above you, use a strong step stool that has a grab bar.  Keep electrical cords out of the way.  Do not use floor polish or wax that makes floors slippery. If you must use wax, use non-skid floor wax.  Do not  have throw rugs and other things on the floor that can make you trip. What can I do with my stairs?  Do not leave any items on the stairs.  Make sure that there are handrails on both sides of the stairs and use them. Fix handrails that are broken or loose. Make sure that handrails are as long as the stairways.  Check any carpeting to make sure that it is firmly attached to the stairs. Fix any carpet that is loose or worn.  Avoid having throw rugs at the top or bottom of the stairs. If you do have throw rugs, attach them to the floor with carpet tape.  Make sure that you have a light switch at the top of the stairs and the bottom of the stairs. If you do not have them, ask someone to add them for you. What else can I do to help prevent falls?  Wear shoes that:  Do not have high heels.  Have rubber bottoms.  Are comfortable and fit you well.  Are closed  at the toe. Do not wear sandals.  If you use a stepladder:  Make sure that it is fully opened. Do not climb a closed stepladder.  Make sure that both sides of the stepladder are locked into place.  Ask someone to hold it for you, if possible.  Clearly mark and make sure that you can see:  Any grab bars or handrails.  First and last steps.  Where the edge of each step is.  Use tools that help you move around (mobility aids) if they are needed. These include:  Canes.  Walkers.  Scooters.  Crutches.  Turn on the lights when you go into a dark area. Replace any light bulbs as soon as they burn out.  Set up your furniture so you have a clear path. Avoid moving your furniture around.  If any of your floors are uneven, fix them.  If there are any pets around you, be aware of where they are.  Review your medicines with your doctor. Some medicines can make you feel dizzy. This can increase your chance of falling. Ask your doctor what other things that you can do to help prevent falls. This information is not intended to replace advice given to you by your health care provider. Make sure you discuss any questions you have with your health care provider. Document Released: 12/09/2008 Document Revised: 07/21/2015 Document Reviewed: 03/19/2014 Elsevier Interactive Patient Education  2017 [employer].

## 2019-12-08 NOTE — Progress Notes (Signed)
Alejandra Hess [facility] Aug 05, 1955 [id number]   History:    64 y.o.  [id number] married.  Moved from [place] to be closer to the grandchildren (42).  RP:  Established patient presenting for annual gyn exam   HPI: Status post TAH.  Menopause, well on no hormone replacement therapy.  No postmenopausal bleeding.  Feeling bloated on-off x 2 months.  No constipation, no blood in stool, no change in nutrition.  Will set an appointment with her Gertie Fey who follows her for GERD.  No pelvic pain.  No pain with intercourse.  Urine and bowel movements normal.  Breast normal.  Body mass index 29.23.  Good fitness and healthy nutrition.  Health labs with Dr. Alfonso Ramus.  Last colonoscopy 3 years ago.  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  Past medical history,surgical history, family history and social history were all reviewed and documented in the EPIC chart.  Gynecologic History No LMP recorded. Patient has had a hysterectomy.  Obstetric History OB History  Gravida Para Term Preterm AB Living  4 2     2 2   SAB TAB Ectopic Multiple Live Births  2            # Outcome Date GA Lbr Len/2nd Weight Sex Delivery Anes PTL Lv  4 SAB           3 SAB           2 Para           1 Para              ROS: A ROS was performed and pertinent positives and negatives are included in the history.  GENERAL: No fevers or chills. HEENT: No change in vision, no earache, sore throat or sinus congestion. NECK: No pain or stiffness. CARDIOVASCULAR: No chest pain or pressure. No palpitations. PULMONARY: No shortness of breath, cough or wheeze. GASTROINTESTINAL: No abdominal pain, nausea, vomiting or diarrhea, melena or bright red blood per rectum. GENITOURINARY: No urinary frequency, urgency, hesitancy or dysuria. MUSCULOSKELETAL: No joint or muscle pain, no back pain, no recent trauma. DERMATOLOGIC: No rash, no itching, no  lesions. ENDOCRINE: No polyuria, polydipsia, no heat or cold intolerance. No recent change in weight. HEMATOLOGICAL: No anemia or easy bruising or bleeding. NEUROLOGIC: No headache, seizures, numbness, tingling or weakness. PSYCHIATRIC: No depression, no loss of interest in normal activity or change in sleep pattern.     Exam:   BP 136/84   Ht 5\' 3"  (1.6 m)   Wt 165 lb (74.8 kg)   BMI 29.23 kg/m   Body mass index is 29.23 kg/m.  General appearance : Well developed well nourished female. No acute distress HEENT: Eyes: no retinal hemorrhage or exudates,  Neck supple, trachea midline, no carotid bruits, no thyroidmegaly Lungs: Clear to auscultation, no rhonchi or wheezes, or rib retractions  Heart: Regular rate and rhythm, no murmurs or gallops Breast:Examined in sitting and supine position were symmetrical in appearance, no palpable masses or tenderness,  no skin retraction, no nipple inversion, no nipple discharge, no skin discoloration, no axillary or supraclavicular lymphadenopathy Abdomen: no palpable masses or tenderness, no rebound or guarding Extremities: no edema or skin discoloration or tenderness  Pelvic: Vulva: Normal             Vagina: No gross lesions or discharge  Cervix/Uterus absent  Adnexa  Without masses or tenderness  Anus:  Normal   Assessment/Plan:  64 y.o. female for annual exam   1. Well female exam with routine gynecological exam Gynecologic exam status post total hysterectomy. No indication to repeat a Pap test this year. Breast exam normal. Will schedule a screening mammogram now. Colonoscopy 2017. Health labs with family physician.  2. S/P total hysterectomy  3. Postmenopause Well on no HRT.    4. 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 Medical treatment declined by patient.  She has GERD under investigation/treatment with GE, therefore Biphosphanates would not be tolerated.   Declines Prolia at this time.  Vit D supplements, Ca++ 1500 mg daily and regular weightbearing physical activities recommended  5. Bloating Recommend investigation through patient's Gastro-Enterologist.  Patient will schedule an appointment now.  6. Overweight (BMI 25.0-29.9) Recommend a lower Calorie/Carb diet.  Aerobic activities 5 times a week and light weight lifting every 2 days.  Princess Bruins MD, 12:45 PM 12/08/2019

## 2019-12-09 ENCOUNTER — Encounter: Payer: Self-pay | Admitting: Family Medicine

## 2019-12-09 ENCOUNTER — Encounter: Payer: Self-pay | Admitting: Obstetrics & Gynecology

## 2019-12-09 ENCOUNTER — Ambulatory Visit (INDEPENDENT_AMBULATORY_CARE_PROVIDER_SITE_OTHER): Payer: Medicare Other | Admitting: Family Medicine

## 2019-12-09 ENCOUNTER — Telehealth: Payer: Self-pay

## 2019-12-09 VITALS — BP 134/76 | HR 77 | Temp 96.5°F | Ht 63.0 in | Wt 164.6 lb

## 2019-12-09 DIAGNOSIS — E7800 Pure hypercholesterolemia, unspecified: Secondary | ICD-10-CM

## 2019-12-09 DIAGNOSIS — Z23 Encounter for immunization: Secondary | ICD-10-CM

## 2019-12-09 DIAGNOSIS — E119 Type 2 diabetes mellitus without complications: Secondary | ICD-10-CM

## 2019-12-09 LAB — CBC
HCT: 42.5 % (ref 36.0–46.0)
Hemoglobin: 14.2 g/dL (ref 12.0–15.0)
MCHC: 33.4 g/dL (ref 30.0–36.0)
MCV: 94.5 fl (ref 78.0–100.0)
Platelets: 305 10*3/uL (ref 150.0–400.0)
RBC: 4.5 Mil/uL (ref 3.87–5.11)
RDW: 13.6 % (ref 11.5–15.5)
WBC: 7.5 10*3/uL (ref 4.0–10.5)

## 2019-12-09 LAB — COMPREHENSIVE METABOLIC PANEL
ALT: 29 U/L (ref 0–35)
AST: 17 U/L (ref 0–37)
Albumin: 4.6 g/dL (ref 3.5–5.2)
Alkaline Phosphatase: 71 U/L (ref 39–117)
BUN: 17 mg/dL (ref 6–23)
CO2: 29 mEq/L (ref 19–32)
Calcium: 10 mg/dL (ref 8.4–10.5)
Chloride: 100 mEq/L (ref 96–112)
Creatinine, Ser: 0.73 mg/dL (ref 0.40–1.20)
GFR: 85.6 mL/min (ref >60.00)
Glucose, Bld: 130 mg/dL — ABNORMAL HIGH (ref 70–99)
Potassium: 4.2 mEq/L (ref 3.5–5.1)
Sodium: 139 mEq/L (ref 135–145)
Total Bilirubin: 0.5 mg/dL (ref 0.2–1.2)
Total Protein: 7.3 g/dL (ref 6.0–8.3)

## 2019-12-09 LAB — LIPID PANEL
Cholesterol: 164 mg/dL (ref 0–200)
HDL: 57.1 mg/dL (ref >39.00)
LDL Cholesterol: 90 mg/dL (ref 0–99)
NonHDL: 107.24
Total CHOL/HDL Ratio: 3
Triglycerides: 87 mg/dL (ref 0.0–149.0)
VLDL: 17.4 mg/dL (ref 0.0–40.0)

## 2019-12-09 LAB — HEMOGLOBIN A1C: Hgb A1c MFr Bld: 6.9 % — ABNORMAL HIGH (ref 4.6–6.5)

## 2019-12-09 NOTE — Telephone Encounter (Signed)
Patient calling with concerns about Metformin. Please advise message below.   Can you please ask Dr Ethelene Hal if he thinks my lower abdominal pain and diarrhea could be caused by the Metformin? Thank you Alejandra Hess

## 2019-12-09 NOTE — Telephone Encounter (Signed)
Error message

## 2019-12-09 NOTE — Progress Notes (Addendum)
Established Patient Office Visit  Subjective:  Patient ID: Alejandra Hess, female    DOB: Dec 01, 1955  Age: 65 y.o. MRN: [id number]  CC:  Chief Complaint  Patient presents with  . Follow-up    follow up on DM,     HPI Alejandra Hess presents for follow-up of her diabetes and elevated cholesterol.  Both of been well controlled with Glucophage and Zocor respectively.  Status post recent eye check.  She continues to have tough time with her gastric intestinal issues.  Tells me that recent esophageal manometry and pH probe test were negative.  She is on 2 proton pump inhibitors.  Now she seems to be experiencing intestinal bloating with intermittent explosive diarrhea.  Tells me that she has tried elimination diets to see if that would help the bloating.  It has not.  Follow-up is planned in the next few weeks with gastroenterology.  Her weight has been stable.  She denies depression.  Has recently been able to exercise more.  Plantar fasciitis is doing better.  No longer taking the Symbicort.  H. pylori was checked with recent endoscopy.  She is only drinking 1 glass of wine twice a week she tells me.  Past Medical History:  Diagnosis Date  . Allergy   . Asthma   . DM (diabetes mellitus) ([place])   . GERD (gastroesophageal reflux disease)   . Heart murmur    as a child  . HLD (hyperlipidemia)     Past Surgical History:  Procedure Laterality Date  . [place] STUDY N/A 11/18/2019   Procedure: [place] STUDY;  Surgeon: Irving Copas., MD;  Location: [facility];  Service: Gastroenterology;  Laterality: N/A;  . ABDOMINAL HYSTERECTOMY    . AUGMENTATION MAMMAPLASTY Bilateral    @ 43 lift with implants  . BREAST EXCISIONAL BIOPSY Right    @ 53  . BREAST EXCISIONAL BIOPSY Left    @43 ?  . CHOLECYSTECTOMY    . COLONOSCOPY    . ESOPHAGEAL MANOMETRY N/A 11/18/2019   Procedure: ESOPHAGEAL MANOMETRY (EM);  Surgeon: Irving Copas., MD;  Location: [facility];  Service:  Gastroenterology;  Laterality: N/A;  . UPPER GASTROINTESTINAL ENDOSCOPY      Family History  Problem Relation Age of Onset  . Hearing loss Mother   . Hyperlipidemia Mother   . Hypertension Mother   . Diabetes Mother   . Heart attack Father   . Heart disease Father   . Hyperlipidemia Father   . Hypertension Father   . Melanoma Father   . Asthma Sister   . Hyperlipidemia Sister   . Hypertension Sister   . Hyperlipidemia Sister   . Breast cancer Sister 63  . Asthma Sister   . Depression Sister   . Heart attack Sister   . Heart disease Sister   . Hypertension Sister   . Diabetes Sister   . Breast cancer Maternal Aunt        over 91  . Breast cancer Paternal Aunt        over 53  . Breast cancer Paternal Grandmother        over 51   . Breast cancer Maternal Aunt        over 96   . Colon cancer Maternal Grandfather   . Esophageal cancer Neg Hx   . Inflammatory bowel disease Neg Hx   . Liver disease Neg Hx   . Pancreatic cancer Neg Hx   . Rectal cancer Neg Hx   .  Stomach cancer Neg Hx     Social History   Socioeconomic History  . Marital status: Married    Spouse name: Not on file  . Number of children: 2  . Years of education: Not on file  . Highest education level: Not on file  Occupational History  . Occupation: Therapist, sports  Tobacco Use  . Smoking status: Former Smoker    Types: Cigarettes    Quit date: 2013    Years since quitting: 8.7  . Smokeless tobacco: Never Used  Vaping Use  . Vaping Use: Never used  Substance and Sexual Activity  . Alcohol use: Yes    Comment: 2 glasses of red wine daily  . Drug use: Never  . Sexual activity: Yes    Partners: Male    Comment: 1st intercourse- 81, partners- 5-, MARRIED- 23  Other Topics Concern  . Not on file  Social History Narrative  . Not on file   Social Determinants of Health   Financial Resource Strain: Low Risk   . Difficulty of Paying Living Expenses: Not hard at all  Food Insecurity: No Food Insecurity    . Worried About Charity fundraiser in the Last Year: Never true  . Ran Out of Food in the Last Year: Never true  Transportation Needs: No Transportation Needs  . Lack of Transportation (Medical): No  . Lack of Transportation (Non-Medical): No  Physical Activity: Insufficiently Active  . Days of Exercise per Week: 3 days  . Minutes of Exercise per Session: 30 min  Stress: No Stress Concern Present  . Feeling of Stress : Not at all  Social Connections: Moderately Isolated  . Frequency of Communication with Friends and Family: More than three times a week  . Frequency of Social Gatherings with Friends and Family: Once a week  . Attends Religious Services: Never  . Active Member of Clubs or Organizations: No  . Attends Archivist Meetings: Never  . Marital Status: Married  Human resources officer Violence: Not At Risk  . Fear of Current or Ex-Partner: No  . Emotionally Abused: No  . Physically Abused: No  . Sexually Abused: No    Outpatient Medications Prior to Visit  Medication Sig Dispense Refill  . albuterol (PROVENTIL HFA) 108 (90 Base) MCG/ACT inhaler Inhale into the lungs. PRN    . albuterol (PROVENTIL) (2.5 MG/3ML) 0.083% nebulizer solution PRN  7  . budesonide-formoterol (SYMBICORT) 160-4.5 MCG/ACT inhaler Inhale 1 puff into the lungs at bedtime as needed. 1 Inhaler 3  . cetirizine (ZYRTEC) 10 MG tablet Take 10 mg by mouth daily.    . famotidine (PEPCID) 20 MG tablet Take 1 tablet (20 mg total) by mouth at bedtime. 30 tablet 2  . Fluocinolone Acetonide 0.01 % OIL Place 4 drops into both ears daily as needed. 20 mL 2  . magnesium oxide (MAG-OX) 400 MG tablet Take 400 mg by mouth daily.     . Multiple Vitamin (MULTIVITAMIN) tablet Take 1 tablet by mouth daily.    [name] omeprazole (PRILOSEC) 40 MG capsule Take 40 mg by mouth daily.    . pantoprazole (PROTONIX) 40 MG tablet Take 1 tablet (40 mg total) by mouth daily. 90 tablet 3  . Probiotic Product (PROBIOTIC PO) Take 1 capsule  by mouth 2 (two) times daily.     . simvastatin (ZOCOR) 40 MG tablet Take 1 tablet (40 mg total) by mouth at bedtime. 90 tablet 3  . triamcinolone (NASACORT ALLERGY 24HR) 55 MCG/ACT AERO nasal  inhaler Place 1 spray into the nose daily.    . metFORMIN (GLUCOPHAGE) 500 MG tablet TAKE 1 TABLET BY MOUTH TWO TIMES DAILY  WITH A MEAL. 180 tablet 1   No facility-administered medications prior to visit.    Allergies  Allergen Reactions  . Azithromycin Rash  . Penicillins Hives and Rash    ROS Review of Systems  Constitutional: Negative.   HENT: Negative.  Negative for voice change.   Eyes: Negative for photophobia and visual disturbance.  Respiratory: Negative.   Cardiovascular: Negative.   Gastrointestinal: Positive for abdominal distention and diarrhea. Negative for anal bleeding, blood in stool and constipation.  Genitourinary: Negative.   Musculoskeletal: Negative for gait problem and joint swelling.  Allergic/Immunologic: Negative for immunocompromised state.  Neurological: Negative for tremors and speech difficulty.  Psychiatric/Behavioral: Negative for dysphoric mood.      Objective:    Physical Exam Vitals and nursing note reviewed.  Constitutional:      General: She is not in acute distress.    Appearance: Normal appearance. She is normal weight. She is not ill-appearing, toxic-appearing or diaphoretic.  HENT:     Head: Normocephalic and atraumatic.     Right Ear: Tympanic membrane, ear canal and external ear normal.     Left Ear: Tympanic membrane, ear canal and external ear normal.  Eyes:     General:        Right eye: No discharge.        Left eye: No discharge.     Extraocular Movements: Extraocular movements intact.     Conjunctiva/sclera: Conjunctivae normal.     Pupils: Pupils are equal, round, and reactive to light.  Cardiovascular:     Rate and Rhythm: Normal rate and regular rhythm.  Pulmonary:     Effort: Pulmonary effort is normal.     Breath sounds:  Normal breath sounds.  Musculoskeletal:     Cervical back: No rigidity or tenderness.  Lymphadenopathy:     Cervical: No cervical adenopathy.  Skin:    General: Skin is warm and dry.  Neurological:     Mental Status: She is alert and oriented to person, place, and time.  Psychiatric:        Mood and Affect: Mood normal.        Behavior: Behavior normal.     BP 134/76   Pulse 77   Temp (!) 96.5 F (35.8 C) (Tympanic)   Ht 5\' 3"  (1.6 m)   Wt 164 lb 9.6 oz (74.7 kg)   SpO2 95%   BMI 29.16 kg/m  Wt Readings from Last 3 Encounters:  12/09/19 164 lb 9.6 oz (74.7 kg)  12/08/19 165 lb (74.8 kg)  12/08/19 165 lb (74.8 kg)     Health Maintenance Due  Topic Date Due  . TETANUS/TDAP  Never done  . INFLUENZA VACCINE  09/27/2019  . FOOT EXAM  10/21/2019  . OPHTHALMOLOGY EXAM  11/17/2019  . URINE MICROALBUMIN  01/20/2020    There are no preventive care reminders to display for this patient.  Lab Results  Component Value Date   TSH 1.53 01/20/2019   Lab Results  Component Value Date   WBC 7.5 12/09/2019   HGB 14.2 12/09/2019   HCT 42.5 12/09/2019   MCV 94.5 12/09/2019   PLT 305.0 12/09/2019   Lab Results  Component Value Date   NA 139 12/09/2019   K 4.2 12/09/2019   CO2 29 12/09/2019   GLUCOSE 130 (H) 12/09/2019   BUN 17  12/09/2019   CREATININE 0.73 12/09/2019   BILITOT 0.5 12/09/2019   ALKPHOS 71 12/09/2019   AST 17 12/09/2019   ALT 29 12/09/2019   PROT 7.3 12/09/2019   ALBUMIN 4.6 12/09/2019   CALCIUM 10.0 12/09/2019   GFR 85.60 12/09/2019   Lab Results  Component Value Date   CHOL 164 12/09/2019   Lab Results  Component Value Date   HDL 57.10 12/09/2019   Lab Results  Component Value Date   LDLCALC 90 12/09/2019   Lab Results  Component Value Date   TRIG 87.0 12/09/2019   Lab Results  Component Value Date   CHOLHDL 3 12/09/2019   Lab Results  Component Value Date   HGBA1C 6.9 (H) 12/09/2019      Assessment & Plan:   Problem List  Items Addressed This Visit      Endocrine   Controlled type 2 diabetes mellitus without complication, without long-term current use of insulin (HCC) - Primary   Relevant Medications   Alogliptin Benzoate (NESINA) 25 MG TABS   Other Relevant Orders   CBC (Completed)   Comprehensive metabolic panel (Completed)   Hemoglobin A1c (Completed)     Other   Elevated LDL cholesterol level   Relevant Orders   Lipid panel (Completed)    Other Visit Diagnoses    Need for vaccination with 13-polyvalent pneumococcal conjugate vaccine       Relevant Orders   Pneumococcal conjugate vaccine 13-valent IM (Completed)      Meds ordered this encounter  Medications  . DISCONTD: sitaGLIPtin (JANUVIA) 100 MG tablet    Sig: Take one each morning. May cause urinary frequency.    Dispense:  30 tablet    Refill:  2  . DISCONTD: sitaGLIPtin (JANUVIA) 100 MG tablet    Sig: Take one each morning. May cause urinary frequency.    Dispense:  30 tablet    Refill:  3  . Alogliptin Benzoate (NESINA) 25 MG TABS    Sig: Take one each morning.    Dispense:  90 tablet    Refill:  1    Follow-up: Return in about 3 months (around 03/10/2020).  Continue Metformin and simvastatin.  Advised her to stop all alcohol with ongoing history of gastritis.  [name], MD   10/14 addendum: patient concerned that metformin may be responsible for some of her GI symptoms. Will discontinue metformin and try [place].

## 2019-12-10 MED ORDER — ALOGLIPTIN BENZOATE 25 MG PO TABS: ORAL_TABLET | ORAL | 1 refills | Status: DC

## 2019-12-10 MED ORDER — SITAGLIPTIN PHOSPHATE 100 MG PO TABS: ORAL_TABLET | ORAL | 2 refills | Status: DC

## 2019-12-10 MED ORDER — SITAGLIPTIN PHOSPHATE 100 MG PO TABS: ORAL_TABLET | ORAL | 3 refills | Status: DC

## 2019-12-10 NOTE — Addendum Note (Signed)
Addended by: Abelino Derrick A on: 12/10/2019 01:09 PM   Modules accepted: Orders

## 2019-12-10 NOTE — Telephone Encounter (Signed)
Okay, sent rx for nesina.

## 2019-12-10 NOTE — Telephone Encounter (Signed)
Okay, please stop the metformin.  Have sent a Rx for Januvia for her take each morning.   I have put some information in the chart for her to download about the medication. It may cause urinary frequency.

## 2019-12-10 NOTE — Telephone Encounter (Signed)
It could be caused by the metformin, BUT that is a usual side effect and does not tend to show up later. She has been taking it for a while. Probably wouldn't show up later for as long as she has been taking it.

## 2019-12-10 NOTE — Telephone Encounter (Signed)
Needs to follow up in 3 months now.

## 2019-12-10 NOTE — Addendum Note (Signed)
Addended by: Jon Billings on: 12/10/2019 04:43 PM   Modules accepted: Orders

## 2019-12-10 NOTE — Telephone Encounter (Signed)
Resopnse sent via Mychart

## 2019-12-10 NOTE — Patient Instructions (Signed)
Sitagliptin oral tablet What is this medicine? SITAGLIPTIN (sit a GLIP tin) helps to treat type 2 diabetes. It helps to control blood sugar. Treatment is combined with diet and exercise. This medicine may be used for other purposes; ask your health care provider or pharmacist if you have questions. COMMON BRAND NAME(S): Januvia What should I tell my health care provider before I take this medicine? They need to know if you have any of these conditions:  diabetic ketoacidosis  kidney disease  pancreatitis  previous swelling of the tongue, face, or lips with difficulty breathing, difficulty swallowing, hoarseness, or tightening of the throat  type 1 diabetes  an unusual or allergic reaction to sitagliptin, other medicines, foods, dyes, or preservatives  pregnant or trying to get pregnant  breast-feeding How should I use this medicine? Take this medicine by mouth with a glass of water. Follow the directions on the prescription label. You can take it with or without food. Do not cut, crush or chew this medicine. Take your dose at the same time each day. Do not take more often than directed. Do not stop taking except on your doctor's advice. A special MedGuide will be given to you by the pharmacist with each prescription and refill. Be sure to read this information carefully each time. Talk to your pediatrician regarding the use of this medicine in children. Special care may be needed. Overdosage: If you think you have taken too much of this medicine contact a poison control center or emergency room at once. NOTE: This medicine is only for you. Do not share this medicine with others. What if I miss a dose? If you miss a dose, take it as soon as you can. If it is almost time for your next dose, take only that dose. Do not take double or extra doses. What may interact with this medicine? Do not take this medicine with any of the following medications:  gatifloxacin This medicine may also  interact with the following medications:  alcohol  digoxin  insulin  sulfonylureas like glimepiride, glipizide, glyburide This list may not describe all possible interactions. Give your health care provider a list of all the medicines, herbs, non-prescription drugs, or dietary supplements you use. Also tell them if you smoke, drink alcohol, or use illegal drugs. Some items may interact with your medicine. What should I watch for while using this medicine? Visit your doctor or health care professional for regular checks on your progress. A test called the HbA1C (A1C) will be monitored. This is a simple blood test. It measures your blood sugar control over the last 2 to 3 months. You will receive this test every 3 to 6 months. Learn how to check your blood sugar. Learn the symptoms of low and high blood sugar and how to manage them. Always carry a quick-source of sugar with you in case you have symptoms of low blood sugar. Examples include hard sugar candy or glucose tablets. Make sure others know that you can choke if you eat or drink when you develop serious symptoms of low blood sugar, such as seizures or unconsciousness. They must get medical help at once. Tell your doctor or health care professional if you have high blood sugar. You might need to change the dose of your medicine. If you are sick or exercising more than usual, you might need to change the dose of your medicine. Do not skip meals. Ask your doctor or health care professional if you should avoid alcohol. Many nonprescription   cough and cold products contain sugar or alcohol. These can affect blood sugar. Wear a medical ID bracelet or chain, and carry a card that describes your disease and details of your medicine and dosage times. What side effects may I notice from receiving this medicine? Side effects that you should report to your doctor or health care professional as soon as possible:  allergic reactions like skin rash,  itching or hives, swelling of the face, lips, or tongue  breathing problems  general ill feeling or flu-like symptoms  joint pain  loss of appetite  redness, blistering, peeling or loosening of the skin, including inside the mouth  signs and symptoms of heart failure like breathing problems, fast, irregular heartbeat, sudden weight gain; swelling of the ankles, feet, hands; unusually weak or tired  signs and symptoms of low blood sugar such as feeling anxious, confusion, dizziness, increased hunger, unusually weak or tired, sweating, shakiness, cold, irritable, headache, blurred vision, fast heartbeat, loss of consciousness  signs and symptoms of muscle injury like dark urine; trouble passing urine or change in the amount of urine; unusually weak or tired; muscle pain; back pain  unusual stomach upset or pain  vomiting Side effects that usually do not require medical attention (report to your doctor or health care professional if they continue or are bothersome):  diarrhea  headache  sore throat  stomach upset  stuffy or runny nose This list may not describe all possible side effects. Call your doctor for medical advice about side effects. You may report side effects to FDA at 1-800-FDA-1088. Where should I keep my medicine? Keep out of the reach of children. Store at room temperature between 15 and 30 degrees C (59 and 86 degrees F). Throw away any unused medicine after the expiration date. NOTE: This sheet is a summary. It may not cover all possible information. If you have questions about this medicine, talk to your doctor, pharmacist, or health care provider.  2020 Elsevier/Gold Standard (2017-09-17 16:38:43)  

## 2019-12-15 ENCOUNTER — Other Ambulatory Visit: Payer: Self-pay

## 2019-12-15 ENCOUNTER — Encounter: Payer: Self-pay | Admitting: Family Medicine

## 2019-12-15 MED ORDER — VENLAFAXINE HCL 25 MG PO TABS: 25.0000 mg | ORAL_TABLET | Freq: Every day | ORAL | 0 refills | Status: DC

## 2019-12-17 DIAGNOSIS — Z23 Encounter for immunization: Secondary | ICD-10-CM | POA: Diagnosis not present

## 2019-12-18 DIAGNOSIS — Z1152 Encounter for screening for COVID-19: Secondary | ICD-10-CM | POA: Diagnosis not present

## 2020-01-04 ENCOUNTER — Ambulatory Visit: Payer: Medicare Other | Admitting: Gastroenterology

## 2020-01-14 ENCOUNTER — Ambulatory Visit (INDEPENDENT_AMBULATORY_CARE_PROVIDER_SITE_OTHER): Payer: Medicare Other | Admitting: Gastroenterology

## 2020-01-14 ENCOUNTER — Ambulatory Visit
Admission: RE | Admit: 2020-01-14 | Discharge: 2020-01-14 | Disposition: A | Discharge status: Home / Self Care | Payer: Medicare Other | Source: Ambulatory Visit | Attending: Obstetrics & Gynecology | Admitting: Obstetrics & Gynecology

## 2020-01-14 ENCOUNTER — Other Ambulatory Visit: Payer: Self-pay

## 2020-01-14 ENCOUNTER — Encounter: Payer: Self-pay | Admitting: Gastroenterology

## 2020-01-14 VITALS — BP 132/84 | HR 66 | Ht 64.0 in | Wt 172.1 lb

## 2020-01-14 DIAGNOSIS — Z1231 Encounter for screening mammogram for malignant neoplasm of breast: Secondary | ICD-10-CM | POA: Diagnosis not present

## 2020-01-14 DIAGNOSIS — R12 Heartburn: Secondary | ICD-10-CM

## 2020-01-14 NOTE — Progress Notes (Signed)
P  Chief Complaint:    Functional heartburn  GI History: Alejandra Hess is a 64 year old female  initially referred to me in 10/2019 by Dr. Rush Landmark for evaluation of possible antireflux intervention with Transoral Incisionless Fundoplication (TIF) with a goal to stop or significantly reduce acid suppression therapy.   GERD history: -Index symptoms: HB, sinus congestion, sore throat, nocturnal cough -Exacerbating features: Nocturnal (cough, HB), post prandial -Medications trialed: Dexilant, Domperidone, omeprazole, Zantac, Pepcid, Carafate  -Current medications: Carafate, Protonix 40 mg QAM, Prilosec 40 mg qhs, Tums (5-6/day); HOB elevation -Complications: None  GERD evaluation: -Last EGD: 07/2019 -Barium esophagram: None -Esophageal Manometry:  10/2019: Normal -pH/Impedance:  10/2019: [place] 11, pH <4 of 3.7%, normal impedance, negative symptom correlation by SAP c/w Functional heartburn.  Started Effexor 25 mg/day -[place]ravo: N/A  Endoscopic History: -EGD (05/2019, Dr. Rush Landmark): Schatzki's ring, small <2 cm hiatal hernia, normal Z-line, gastric polyps  GERD-HRQL Questionnaire Score: 32/50 (on PPI) and marked "dissatisfied" with overall satisfaction of current health condition  HPI:     Patient is a 64 y.o. female presenting to the Gastroenterology Clinic for follow-up.  Last seen by me on 10/30/2019 for evaluation of reflux and potential antireflux surgery as outlined above.  Since then has completed Esophageal manometry and pH/impedance (off PPI).  Manometry was normal and pH/impedance notable for functional heartburn.  She started on Effexor 25 mg/day with plan to increase to 37.5 mg and eventually titrate up to 50 or 75 mg if needed.  She continues taking Protonix 40 mg/day and Pepcid 20 mg qhs.  Started Effexor about 4 weeks ago with significant improvement.  Reports symptoms have essentially resolved.  She is otherwise without any complaints today.  Review of systems:      No chest pain, no SOB, no fevers, no urinary sx   Past Medical History:  Diagnosis Date  . Allergy   . Asthma   . DM (diabetes mellitus) ([place])   . GERD (gastroesophageal reflux disease)   . Heart murmur    as a child  . HLD (hyperlipidemia)     Patient's surgical history, family medical history, social history, medications and allergies were all reviewed in Epic    Current Outpatient Medications  Medication Sig Dispense Refill  . albuterol (PROVENTIL HFA) 108 (90 Base) MCG/ACT inhaler Inhale into the lungs. PRN    . albuterol (PROVENTIL) (2.5 MG/3ML) 0.083% nebulizer solution PRN  7  . Alogliptin Benzoate (NESINA) 25 MG TABS Take one each morning. 90 tablet 1  . budesonide-formoterol (SYMBICORT) 160-4.5 MCG/ACT inhaler Inhale 1 puff into the lungs at bedtime as needed. 1 Inhaler 3  . cetirizine (ZYRTEC) 10 MG tablet Take 10 mg by mouth daily.    . famotidine (PEPCID) 20 MG tablet Take 1 tablet (20 mg total) by mouth at bedtime. 30 tablet 2  . Fluocinolone Acetonide 0.01 % OIL Place 4 drops into both ears daily as needed. 20 mL 2  . JANUVIA 100 MG tablet 1 tablet daily.    . magnesium oxide (MAG-OX) 400 MG tablet Take 400 mg by mouth daily.     . Multiple Vitamin (MULTIVITAMIN) tablet Take 1 tablet by mouth daily.    . pantoprazole (PROTONIX) 40 MG tablet Take 1 tablet (40 mg total) by mouth daily. 90 tablet 3  . Probiotic Product (PROBIOTIC PO) Take 1 capsule by mouth 2 (two) times daily.     . simvastatin (ZOCOR) 40 MG tablet Take 1 tablet (40 mg total) by mouth at bedtime.  90 tablet 3  . venlafaxine (EFFEXOR) 25 MG tablet Take 1 tablet (25 mg total) by mouth daily. 30 tablet 0  . triamcinolone (NASACORT ALLERGY 24HR) 55 MCG/ACT AERO nasal inhaler Place 1 spray into the nose daily. (Patient not taking: Reported on 01/14/2020)     No current facility-administered medications for this visit.    Physical Exam:     BP 132/84   Pulse 66   Ht 5\' 4"  (1.626 m)   Wt 172 lb 2 oz  (78.1 kg)   BMI 29.55 kg/m   GENERAL:  Pleasant female in NAD PSYCH: : Cooperative, normal affect NEURO: Alert and oriented x 3, no focal neurologic deficits   IMPRESSION and PLAN:    1) Functional Heartburn -Has had excellent clinical response to Effexor 25 mg/day.  No change in his medication at this time -Stopping Pepcid -2 weeks after stopping Pepcid, will start Protonix wean to 20 mg/day x2 weeks and then 20 mg qod x2 weeks then discontinue completely if no breakthrough symptoms -Can follow-up with me as needed in the future           Littleton Common ,DO, FACG 01/14/2020, 11:19 AM

## 2020-01-14 NOTE — Patient Instructions (Signed)
If you are age 66 or older, your body mass index should be between 23-30. Your Body mass index is 29.55 kg/m. If this is out of the aforementioned range listed, please consider follow up with your Primary Care Provider.  If you are age 24 or younger, your body mass index should be between 19-25. Your Body mass index is 29.55 kg/m. If this is out of the aformentioned range listed, please consider follow up with your Primary Care Provider.    Stop taking Pepcid 2 weeks after pepcid is stopped, start titrating down on your Protonix to 20 mg a day for 2 weeks. Then Protonix 20mg  every other day for 2 weeks. Then you may discontinue the medication completely.  Return to the clinic as needed.  Due to recent changes in healthcare laws, you may see the results of your imaging and laboratory studies on MyChart before your provider has had a chance to review them.  We understand that in some cases there may be results that are confusing or concerning to you. Not all laboratory results come back in the same time frame and the provider may be waiting for multiple results in order to interpret others.  Please give [place] 48 hours in order for your provider to thoroughly review all the results before contacting the office for clarification of your results.   Thank you for choosing me and [facility].  Vito Cirigliano, D.O.

## 2020-01-18 ENCOUNTER — Other Ambulatory Visit: Payer: Self-pay | Admitting: Gastroenterology

## 2020-01-26 ENCOUNTER — Encounter: Payer: Self-pay | Admitting: Podiatry

## 2020-01-26 MED ORDER — VENLAFAXINE HCL 50 MG PO TABS: 50.0000 mg | ORAL_TABLET | Freq: Every day | ORAL | 2 refills | Status: DC

## 2020-01-26 NOTE — Telephone Encounter (Signed)
Sent rx for Effexor 50 mg- for 90 days. Patient states she has 1 week left of 37.5 and will then titrate to 50

## 2020-02-02 ENCOUNTER — Ambulatory Visit: Payer: Medicare Other | Admitting: Gastroenterology

## 2020-02-03 ENCOUNTER — Other Ambulatory Visit: Payer: Self-pay

## 2020-02-03 ENCOUNTER — Encounter: Payer: Self-pay | Admitting: Podiatry

## 2020-02-03 ENCOUNTER — Ambulatory Visit (INDEPENDENT_AMBULATORY_CARE_PROVIDER_SITE_OTHER): Payer: Medicare Other | Admitting: Podiatry

## 2020-02-03 DIAGNOSIS — L600 Ingrowing nail: Secondary | ICD-10-CM | POA: Diagnosis not present

## 2020-02-03 DIAGNOSIS — M722 Plantar fascial fibromatosis: Secondary | ICD-10-CM

## 2020-02-03 MED ORDER — NEOMYCIN-POLYMYXIN-HC 3.5-10000-1 OT SOLN: OTIC | 0 refills | Status: DC

## 2020-02-03 NOTE — Progress Notes (Signed)
Pain subjective:   Patient ID: Alejandra Hess, female   DOB: 64 y.o.   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  I do think the nail should be removed in my opinion is permanent but she wants to try to let them regrow and we will give that one attempt.  I infiltrated each hallux 60 mg like Marcaine mixture sterile prep done remove the hallux nails bilateral flushed out the beds applied sterile dressing with instructions.  For the heel left I discussed the continuation of conservative care continuation of stretching and heel lift therapy and may require further treatment if symptoms persist

## 2020-02-03 NOTE — Patient Instructions (Signed)

## 2020-02-10 DIAGNOSIS — M810 Age-related osteoporosis without current pathological fracture: Secondary | ICD-10-CM | POA: Diagnosis not present

## 2020-02-10 DIAGNOSIS — S20211A Contusion of right front wall of thorax, initial encounter: Secondary | ICD-10-CM | POA: Diagnosis not present

## 2020-02-10 DIAGNOSIS — R071 Chest pain on breathing: Secondary | ICD-10-CM | POA: Diagnosis not present

## 2020-02-10 DIAGNOSIS — R0781 Pleurodynia: Secondary | ICD-10-CM | POA: Diagnosis not present

## 2020-02-10 DIAGNOSIS — W19XXXA Unspecified fall, initial encounter: Secondary | ICD-10-CM | POA: Diagnosis not present

## 2020-02-10 DIAGNOSIS — Z87891 Personal history of nicotine dependence: Secondary | ICD-10-CM | POA: Diagnosis not present

## 2020-02-22 ENCOUNTER — Other Ambulatory Visit: Payer: Self-pay

## 2020-02-22 ENCOUNTER — Telehealth: Payer: Self-pay

## 2020-02-22 MED ORDER — PANTOPRAZOLE SODIUM 40 MG PO TBEC
40.0000 mg | DELAYED_RELEASE_TABLET | Freq: Every day | ORAL | 3 refills | Status: DC
Start: 2020-02-22 — End: 2020-03-01

## 2020-02-22 MED ORDER — FAMOTIDINE 20 MG PO TABS: 20.0000 mg | ORAL_TABLET | Freq: Every day | ORAL | 3 refills | Status: DC

## 2020-02-22 MED ORDER — VENLAFAXINE HCL 50 MG PO TABS
50.0000 mg | ORAL_TABLET | Freq: Every day | ORAL | 3 refills | Status: DC
Start: 2020-02-22 — End: 2020-05-23

## 2020-02-22 NOTE — Telephone Encounter (Signed)
Called [facility] mail delivery pharmacy with the following scripts: Pantoprazole-40 mg QD.  90 ct./2 refills Venlafaxine HCL-50mg  QD.  90ct./no refills Famotidine-20mg  QHS.  90ct./no refills

## 2020-02-23 DIAGNOSIS — Z20822 Contact with and (suspected) exposure to covid-19: Secondary | ICD-10-CM | POA: Diagnosis not present

## 2020-02-24 ENCOUNTER — Other Ambulatory Visit: Payer: Self-pay

## 2020-02-24 ENCOUNTER — Encounter: Payer: Self-pay | Admitting: Family

## 2020-02-24 ENCOUNTER — Ambulatory Visit (INDEPENDENT_AMBULATORY_CARE_PROVIDER_SITE_OTHER): Payer: Medicare Other | Admitting: Family

## 2020-02-24 ENCOUNTER — Encounter: Payer: Self-pay | Admitting: Family Medicine

## 2020-02-24 VITALS — BP 148/82 | HR 88 | Temp 97.7°F | Ht 64.0 in | Wt 171.4 lb

## 2020-02-24 DIAGNOSIS — B029 Zoster without complications: Secondary | ICD-10-CM

## 2020-02-24 MED ORDER — GABAPENTIN 300 MG PO CAPS: 300.0000 mg | ORAL_CAPSULE | Freq: Two times a day (BID) | ORAL | 0 refills | Status: DC

## 2020-02-24 MED ORDER — VALACYCLOVIR HCL 1 G PO TABS: 1000.0000 mg | ORAL_TABLET | Freq: Three times a day (TID) | ORAL | 0 refills | Status: DC

## 2020-02-24 MED ORDER — PREDNISONE 20 MG PO TABS: 40.0000 mg | ORAL_TABLET | Freq: Every day | ORAL | 0 refills | Status: DC

## 2020-02-24 NOTE — Patient Instructions (Signed)
Shingles  Shingles is an infection. It gives you a painful skin rash and blisters that have fluid in them. Shingles is caused by the same germ (virus) that causes chickenpox. Shingles only happens in people who:  Have had chickenpox.  Have been given a shot of medicine (vaccine) to protect against chickenpox. Shingles is rare in this group. The first symptoms of shingles may be itching, tingling, or pain in an area on your skin. A rash will show on your skin a few days or weeks later. The rash is likely to be on one side of your body. The rash usually has a shape like a belt or a band. Over time, the rash turns into fluid-filled blisters. The blisters will break open, change into scabs, and dry up. Medicines may:  Help with pain and itching.  Help you get better sooner.  Help to prevent long-term problems. Follow these instructions at home: Medicines  Take over-the-counter and prescription medicines only as told by your doctor.  Put on an anti-itch cream or numbing cream where you have a rash, blisters, or scabs. Do this as told by your doctor. Helping with itching and discomfort   Put cold, wet cloths (cold compresses) on the area of the rash or blisters as told by your doctor.  Cool baths can help you feel better. Try adding baking soda or dry oatmeal to the water to lessen itching. Do not bathe in hot water. Blister and rash care  Keep your rash covered with a loose bandage (dressing).  Wear loose clothing that does not rub on your rash.  Keep your rash and blisters clean. To do this, wash the area with mild soap and cool water as told by your doctor.  Check your rash every day for signs of infection. Check for: ? More redness, swelling, or pain. ? Fluid or blood. ? Warmth. ? Pus or a bad smell.  Do not scratch your rash. Do not pick at your blisters. To help you to not scratch: ? Keep your fingernails clean and cut short. ? Wear gloves or mittens when you sleep, if  scratching is a problem. General instructions  Rest as told by your doctor.  Keep all follow-up visits as told by your doctor. This is important.  Wash your hands often with soap and water. If soap and water are not available, use hand sanitizer. Doing this lowers your chance of getting a skin infection caused by germs (bacteria).  Your infection can cause chickenpox in people who have never had chickenpox or never got a shot of chickenpox vaccine. If you have blisters that did not change into scabs yet, try not to touch other people or be around other people, especially: ? Babies. ? Pregnant women. ? Children who have areas of red, itchy, or rough skin (eczema). ? Very old people who have transplants. ? People who have a long-term (chronic) sickness, like cancer or AIDS. Contact a doctor if:  Your pain does not get better with medicine.  Your pain does not get better after the rash heals.  You have any signs of infection in the rash area. These signs include: ? More redness, swelling, or pain around the rash. ? Fluid or blood coming from the rash. ? The rash area feeling warm to the touch. ? Pus or a bad smell coming from the rash. Get help right away if:  The rash is on your face or nose.  You have pain in your face or pain by   your eye.  You lose feeling on one side of your face.  You have trouble seeing.  You have ear pain, or you have ringing in your ear.  You have a loss of taste.  Your condition gets worse. Summary  Shingles gives you a painful skin rash and blisters that have fluid in them.  Shingles is an infection. It is caused by the same germ (virus) that causes chickenpox.  Keep your rash covered with a loose bandage (dressing). Wear loose clothing that does not rub on your rash.  If you have blisters that did not change into scabs yet, try not to touch other people or be around people. This information is not intended to replace advice given to you by  your health care provider. Make sure you discuss any questions you have with your health care provider. Document Revised: 06/06/2018 Document Reviewed: 10/17/2016 Elsevier Patient Education  2020 [employer]vier Inc[employer]  

## 2020-02-24 NOTE — Progress Notes (Signed)
Acute Office Visit  Subjective:    Patient ID: Alejandra Hess, female    DOB: Mar 05, 1955, 64 y.o.   MRN: [id number]  Chief Complaint  Patient presents with  . Herpes Zoster    Possible shingles in head noticed 2 days ago.     HPI Patient is in today with concerns or possible shingles on her right scalp x 2 days. She reports having tingling and pain to the right, front of her head then noticing a rash today. Pain 5/10. She has began taking Valacyclovir, Prednisone and Aleve that has helped. Denies an visual disturbances.   Past Medical History:  Diagnosis Date  . Allergy   . Asthma   . DM (diabetes mellitus) ([place])   . GERD (gastroesophageal reflux disease)   . Heart murmur    as a child  . HLD (hyperlipidemia)     Past Surgical History:  Procedure Laterality Date  . [place] STUDY N/A 11/18/2019   Procedure: [place] STUDY;  Surgeon: Irving Copas., MD;  Location: [facility];  Service: Gastroenterology;  Laterality: N/A;  . ABDOMINAL HYSTERECTOMY    . AUGMENTATION MAMMAPLASTY Bilateral    @ 43 lift with implants  . BREAST EXCISIONAL BIOPSY Right    @ 53  . BREAST EXCISIONAL BIOPSY Left    @43 ?  . CHOLECYSTECTOMY    . COLONOSCOPY    . ESOPHAGEAL MANOMETRY N/A 11/18/2019   Procedure: ESOPHAGEAL MANOMETRY (EM);  Surgeon: Irving Copas., MD;  Location: [facility];  Service: Gastroenterology;  Laterality: N/A;  . UPPER GASTROINTESTINAL ENDOSCOPY      Family History  Problem Relation Age of Onset  . Hearing loss Mother   . Hyperlipidemia Mother   . Hypertension Mother   . Diabetes Mother   . Heart attack Father   . Heart disease Father   . Hyperlipidemia Father   . Hypertension Father   . Melanoma Father   . Asthma Sister   . Hyperlipidemia Sister   . Hypertension Sister   . Hyperlipidemia Sister   . Breast cancer Sister 38  . Asthma Sister   . Depression Sister   . Heart attack Sister   . Heart disease Sister   . Hypertension Sister    . Diabetes Sister   . Breast cancer Maternal Aunt        over 70  . Breast cancer Paternal Aunt        over 70  . Breast cancer Paternal Grandmother        over 28   . Breast cancer Maternal Aunt        over 60   . Colon cancer Maternal Grandfather   . Esophageal cancer Neg Hx   . Inflammatory bowel disease Neg Hx   . Liver disease Neg Hx   . Pancreatic cancer Neg Hx   . Rectal cancer Neg Hx   . Stomach cancer Neg Hx     Social History   Socioeconomic History  . Marital status: Married    Spouse name: Not on file  . Number of children: 2  . Years of education: Not on file  . Highest education level: Not on file  Occupational History  . Occupation: Therapist, sports  Tobacco Use  . Smoking status: Former Smoker    Types: Cigarettes    Quit date: 2013    Years since quitting: 8.9  . Smokeless tobacco: Never Used  Vaping Use  . Vaping Use: Never used  Substance and  Sexual Activity  . Alcohol use: Yes    Comment: 2 glasses of red wine daily  . Drug use: Never  . Sexual activity: Yes    Partners: Male    Comment: 1st intercourse- 52, partners- 5-, MARRIED- 39  Other Topics Concern  . Not on file  Social History Narrative  . Not on file   Social Determinants of Health   Financial Resource Strain: Low Risk   . Difficulty of Paying Living Expenses: Not hard at all  Food Insecurity: No Food Insecurity  . Worried About Charity fundraiser in the Last Year: Never true  . Ran Out of Food in the Last Year: Never true  Transportation Needs: No Transportation Needs  . Lack of Transportation (Medical): No  . Lack of Transportation (Non-Medical): No  Physical Activity: Insufficiently Active  . Days of Exercise per Week: 3 days  . Minutes of Exercise per Session: 30 min  Stress: No Stress Concern Present  . Feeling of Stress : Not at all  Social Connections: Moderately Isolated  . Frequency of Communication with Friends and Family: More than three times a week  . Frequency of  Social Gatherings with Friends and Family: Once a week  . Attends Religious Services: Never  . Active Member of Clubs or Organizations: No  . Attends Archivist Meetings: Never  . Marital Status: Married  Human resources officer Violence: Not At Risk  . Fear of Current or Ex-Partner: No  . Emotionally Abused: No  . Physically Abused: No  . Sexually Abused: No    Outpatient Medications Prior to Visit  Medication Sig Dispense Refill  . albuterol (PROVENTIL) (2.5 MG/3ML) 0.083% nebulizer solution PRN  7  . albuterol (VENTOLIN HFA) 108 (90 Base) MCG/ACT inhaler Inhale into the lungs. PRN    . Alogliptin Benzoate (NESINA) 25 MG TABS Take one each morning. 90 tablet 1  . budesonide-formoterol (SYMBICORT) 160-4.5 MCG/ACT inhaler Inhale 1 puff into the lungs at bedtime as needed. 1 Inhaler 3  . cetirizine (ZYRTEC) 10 MG tablet Take 10 mg by mouth daily.    . famotidine (PEPCID) 20 MG tablet Take 1 tablet (20 mg total) by mouth at bedtime. 90 tablet 3  . Fluocinolone Acetonide 0.01 % OIL Place 4 drops into both ears daily as needed. 20 mL 2  . magnesium oxide (MAG-OX) 400 MG tablet Take 400 mg by mouth daily.     . Multiple Vitamin (MULTIVITAMIN) tablet Take 1 tablet by mouth daily.    . pantoprazole (PROTONIX) 40 MG tablet Take 1 tablet (40 mg total) by mouth daily. 90 tablet 3  . Probiotic Product (PROBIOTIC PO) Take 1 capsule by mouth 2 (two) times daily.     . simvastatin (ZOCOR) 40 MG tablet Take 1 tablet (40 mg total) by mouth at bedtime. 90 tablet 3  . triamcinolone (NASACORT) 55 MCG/ACT AERO nasal inhaler Place 1 spray into the nose daily.    [name] venlafaxine (EFFEXOR) 50 MG tablet Take 1 tablet (50 mg total) by mouth daily. 90 tablet 3  . JANUVIA 100 MG tablet 1 tablet daily.    [name] neomycin-polymyxin-hydrocortisone (CORTISPORIN) OTIC solution 1-2 drops to toe twice daily after soaking (Patient not taking: Reported on 02/24/2020) 10 mL 0   No facility-administered medications prior to  visit.    Allergies  Allergen Reactions  . Azithromycin Rash  . Penicillins Hives and Rash    Review of Systems  Constitutional: Negative.   Respiratory: Negative.   Cardiovascular:  Negative.   Musculoskeletal: Negative.   Skin: Positive for rash.       Rash to the right scalp, burning and tingling.   Neurological:       Burning and tingling right scalp.   Psychiatric/Behavioral: Negative.   All other systems reviewed and are negative.      Objective:    Physical Exam Vitals reviewed.  Constitutional:      Appearance: Normal appearance. She is normal weight.  Cardiovascular:     Rate and Rhythm: Normal rate and regular rhythm.  Pulmonary:     Effort: Pulmonary effort is normal.     Breath sounds: Normal breath sounds.  Musculoskeletal:     Cervical back: Normal range of motion and neck supple.  Skin:    Findings: Rash present.     Comments: Tender vesicular rash to the right parietal area of the head.  Neurological:     General: No focal deficit present.     Mental Status: She is alert and oriented to person, place, and time.  Psychiatric:        Mood and Affect: Mood normal.        Behavior: Behavior normal.     BP (!) 148/82   Pulse 88   Temp 97.7 F (36.5 C) (Tympanic)   Ht 5\' 4"  (1.626 m)   Wt 171 lb 6.4 oz (77.7 kg)   SpO2 93%   BMI 29.42 kg/m  Wt Readings from Last 3 Encounters:  02/24/20 171 lb 6.4 oz (77.7 kg)  01/14/20 172 lb 2 oz (78.1 kg)  12/09/19 164 lb 9.6 oz (74.7 kg)    Health Maintenance Due  Topic Date Due  . TETANUS/TDAP  Never done  . FOOT EXAM  10/21/2019  . OPHTHALMOLOGY EXAM  11/17/2019  . URINE MICROALBUMIN  01/20/2020    There are no preventive care reminders to display for this patient.   Lab Results  Component Value Date   TSH 1.53 01/20/2019   Lab Results  Component Value Date   WBC 7.5 12/09/2019   HGB 14.2 12/09/2019   HCT 42.5 12/09/2019   MCV 94.5 12/09/2019   PLT 305.0 12/09/2019   Lab Results   Component Value Date   NA 139 12/09/2019   K 4.2 12/09/2019   CO2 29 12/09/2019   GLUCOSE 130 (H) 12/09/2019   BUN 17 12/09/2019   CREATININE 0.73 12/09/2019   BILITOT 0.5 12/09/2019   ALKPHOS 71 12/09/2019   AST 17 12/09/2019   ALT 29 12/09/2019   PROT 7.3 12/09/2019   ALBUMIN 4.6 12/09/2019   CALCIUM 10.0 12/09/2019   GFR 85.60 12/09/2019   Lab Results  Component Value Date   CHOL 164 12/09/2019   Lab Results  Component Value Date   HDL 57.10 12/09/2019   Lab Results  Component Value Date   LDLCALC 90 12/09/2019   Lab Results  Component Value Date   TRIG 87.0 12/09/2019   Lab Results  Component Value Date   CHOLHDL 3 12/09/2019   Lab Results  Component Value Date   HGBA1C 6.9 (H) 12/09/2019       Assessment & Plan:   Problem List Items Addressed This Visit   None   Visit Diagnoses    Herpes zoster without complication    -  Primary   Relevant Medications   valACYclovir (VALTREX) 1000 MG tablet       Meds ordered this encounter  Medications  . valACYclovir (VALTREX) 1000 MG tablet  Sig: Take 1 tablet (1,000 mg total) by mouth 3 (three) times daily.    Dispense:  21 tablet    Refill:  0  . predniSONE (DELTASONE) 20 MG tablet    Sig: Take 2 tablets (40 mg total) by mouth daily with breakfast.    Dispense:  10 tablet    Refill:  0  . gabapentin (NEURONTIN) 300 MG capsule    Sig: Take 1 capsule (300 mg total) by mouth 2 (two) times daily.    Dispense:  30 capsule    Refill:  0   Call if symptoms worsen or persist. Recheck as needed Eulis Foster, FNP

## 2020-03-01 ENCOUNTER — Other Ambulatory Visit: Payer: Self-pay | Admitting: Gastroenterology

## 2020-03-01 MED ORDER — PANTOPRAZOLE SODIUM 40 MG PO TBEC
40.0000 mg | DELAYED_RELEASE_TABLET | Freq: Every day | ORAL | 3 refills | Status: DC
Start: 2020-03-01 — End: 2020-07-01

## 2020-03-04 ENCOUNTER — Ambulatory Visit: Payer: Medicare Other | Admitting: Family Medicine

## 2020-04-14 ENCOUNTER — Ambulatory Visit: Payer: Medicare Other | Admitting: Gastroenterology

## 2020-04-27 ENCOUNTER — Other Ambulatory Visit: Payer: Self-pay | Admitting: Family Medicine

## 2020-04-27 DIAGNOSIS — E7800 Pure hypercholesterolemia, unspecified: Secondary | ICD-10-CM

## 2020-04-28 NOTE — Telephone Encounter (Signed)
Patient states she will schedule an appointment via [place].

## 2020-05-03 ENCOUNTER — Ambulatory Visit: Payer: Medicare Other | Admitting: Gastroenterology

## 2020-05-06 DIAGNOSIS — Z20822 Contact with and (suspected) exposure to covid-19: Secondary | ICD-10-CM | POA: Diagnosis not present

## 2020-05-16 ENCOUNTER — Ambulatory Visit: Payer: Medicare Other | Admitting: Family Medicine

## 2020-05-19 ENCOUNTER — Encounter: Payer: Self-pay | Admitting: Family Medicine

## 2020-05-23 ENCOUNTER — Other Ambulatory Visit: Payer: Self-pay

## 2020-05-23 DIAGNOSIS — E7800 Pure hypercholesterolemia, unspecified: Secondary | ICD-10-CM

## 2020-05-23 DIAGNOSIS — E119 Type 2 diabetes mellitus without complications: Secondary | ICD-10-CM

## 2020-05-23 MED ORDER — ALOGLIPTIN BENZOATE 25 MG PO TABS: ORAL_TABLET | ORAL | 0 refills | Status: DC

## 2020-05-23 MED ORDER — FAMOTIDINE 20 MG PO TABS
20.0000 mg | ORAL_TABLET | Freq: Every day | ORAL | 0 refills | Status: DC
Start: 2020-05-23 — End: 2020-07-15

## 2020-05-23 MED ORDER — SIMVASTATIN 40 MG PO TABS: 40.0000 mg | ORAL_TABLET | Freq: Every day | ORAL | 0 refills | Status: DC

## 2020-05-23 NOTE — Telephone Encounter (Signed)
Refill request for pending Rx last OV 02/24/20 with [facility] last OV with Dr. Ethelene Hal 12/09/19. Patient has an upcoming appointment 07/01/20 had to reschedule March 31st appointment due to leaving to go care for parents in different state. Please advsie

## 2020-05-24 MED ORDER — VENLAFAXINE HCL 50 MG PO TABS
50.0000 mg | ORAL_TABLET | Freq: Every day | ORAL | 0 refills | Status: DC
Start: 2020-05-24 — End: 2020-05-31

## 2020-05-26 ENCOUNTER — Ambulatory Visit: Payer: Medicare Other | Admitting: Family Medicine

## 2020-05-31 ENCOUNTER — Other Ambulatory Visit: Payer: Self-pay

## 2020-05-31 MED ORDER — VENLAFAXINE HCL 50 MG PO TABS
50.0000 mg | ORAL_TABLET | Freq: Every day | ORAL | 0 refills | Status: DC
Start: 2020-05-31 — End: 2020-10-11

## 2020-06-16 DIAGNOSIS — Z23 Encounter for immunization: Secondary | ICD-10-CM | POA: Diagnosis not present

## 2020-06-25 ENCOUNTER — Other Ambulatory Visit: Payer: Self-pay | Admitting: Family Medicine

## 2020-06-25 DIAGNOSIS — E119 Type 2 diabetes mellitus without complications: Secondary | ICD-10-CM

## 2020-07-01 ENCOUNTER — Other Ambulatory Visit: Payer: Self-pay

## 2020-07-01 ENCOUNTER — Ambulatory Visit (INDEPENDENT_AMBULATORY_CARE_PROVIDER_SITE_OTHER): Payer: Medicare Other | Admitting: Family Medicine

## 2020-07-01 ENCOUNTER — Encounter: Payer: Self-pay | Admitting: Family Medicine

## 2020-07-01 VITALS — BP 154/78 | HR 80 | Temp 97.4°F | Ht 64.0 in | Wt 166.8 lb

## 2020-07-01 DIAGNOSIS — E119 Type 2 diabetes mellitus without complications: Secondary | ICD-10-CM

## 2020-07-01 DIAGNOSIS — I10 Essential (primary) hypertension: Secondary | ICD-10-CM | POA: Diagnosis not present

## 2020-07-01 DIAGNOSIS — E7800 Pure hypercholesterolemia, unspecified: Secondary | ICD-10-CM

## 2020-07-01 LAB — URINALYSIS, ROUTINE W REFLEX MICROSCOPIC
Bilirubin Urine: NEGATIVE
Ketones, ur: NEGATIVE
Leukocytes,Ua: NEGATIVE
Nitrite: NEGATIVE
Specific Gravity, Urine: 1.015 (ref 1.000–1.030)
Total Protein, Urine: NEGATIVE
Urine Glucose: NEGATIVE
Urobilinogen, UA: 0.2 (ref 0.0–1.0)
pH: 5.5 (ref 5.0–8.0)

## 2020-07-01 LAB — MICROALBUMIN / CREATININE URINE RATIO
Creatinine,U: 65.3 mg/dL
Microalb Creat Ratio: 2.6 mg/g (ref 0.0–30.0)
Microalb, Ur: 1.7 mg/dL (ref 0.0–1.9)

## 2020-07-01 LAB — COMPREHENSIVE METABOLIC PANEL
ALT: 50 U/L — ABNORMAL HIGH (ref 0–35)
AST: 28 U/L (ref 0–37)
Albumin: 4.6 g/dL (ref 3.5–5.2)
Alkaline Phosphatase: 75 U/L (ref 39–117)
BUN: 12 mg/dL (ref 6–23)
CO2: 27 mEq/L (ref 19–32)
Calcium: 9.8 mg/dL (ref 8.4–10.5)
Chloride: 103 mEq/L (ref 96–112)
Creatinine, Ser: 0.7 mg/dL (ref 0.40–1.20)
GFR: 89.8 mL/min (ref >60.00)
Glucose, Bld: 159 mg/dL — ABNORMAL HIGH (ref 70–99)
Potassium: 3.9 mEq/L (ref 3.5–5.1)
Sodium: 139 mEq/L (ref 135–145)
Total Bilirubin: 0.5 mg/dL (ref 0.2–1.2)
Total Protein: 7.3 g/dL (ref 6.0–8.3)

## 2020-07-01 LAB — CBC
HCT: 41.3 % (ref 36.0–46.0)
Hemoglobin: 14 g/dL (ref 12.0–15.0)
MCHC: 33.8 g/dL (ref 30.0–36.0)
MCV: 90.9 fl (ref 78.0–100.0)
Platelets: 278 10*3/uL (ref 150.0–400.0)
RBC: 4.55 Mil/uL (ref 3.87–5.11)
RDW: 12.9 % (ref 11.5–15.5)
WBC: 6.1 10*3/uL (ref 4.0–10.5)

## 2020-07-01 LAB — HEMOGLOBIN A1C: Hgb A1c MFr Bld: 7.8 % — ABNORMAL HIGH (ref 4.6–6.5)

## 2020-07-01 LAB — LDL CHOLESTEROL, DIRECT: Direct LDL: 114 mg/dL

## 2020-07-01 MED ORDER — OLMESARTAN MEDOXOMIL 5 MG PO TABS: 5.0000 mg | ORAL_TABLET | Freq: Every day | ORAL | 1 refills | Status: DC

## 2020-07-01 NOTE — Patient Instructions (Signed)
Preventing Hypertension Hypertension, also called high blood pressure, is when the force of blood pumping through the arteries is too strong. Arteries are blood vessels that carry blood from the heart throughout the body. Often, hypertension does not cause symptoms until blood pressure is very high. It is important to have your blood pressure checked regularly. Diet and lifestyle changes can help you prevent hypertension, and they may make you feel better overall and improve your quality of life. If you already have hypertension, you may control it with diet and lifestyle changes, as well as with medicine. How can this condition affect me? Over time, hypertension can damage the arteries and decrease blood flow to important parts of the body, including the brain, heart, and kidneys. By keeping your blood pressure in a healthy range, you can help prevent complications like heart attack, heart failure, stroke, kidney failure, and vascular dementia. What can increase my risk?  Being an older adult. Older people are more often affected.  Having family members who have had high blood pressure.  Being obese.  Being female. Males are more likely to have high blood pressure.  Drinking too much alcohol or caffeine.  Smoking or using illegal drugs.  Taking certain medicines, such as antidepressants, decongestants, birth control pills, and NSAIDs, such as ibuprofen.  Having thyroid problems.  Having certain tumors. What actions can I take to prevent or manage this condition? Work with your health care provider to make a hypertension prevention plan that works for you. Follow your plan and keep all follow-up visits as told by your health care provider. Diet changes Maintain a healthy diet. This includes:  Eating less salt (sodium). Ask your health care provider how much sodium is safe for you to have. The general recommendation is to have less than 1 tsp (2,300 mg) of sodium a day. ? Do not add salt  to your food. ? Choose low-sodium options when grocery shopping and eating out.  Limiting fats in your diet. You can do this by eating low-fat or fat-free dairy products and by eating less red meat.  Eating more fruits, vegetables, and whole grains. Make a goal to eat: ? 1-2 cups of fresh fruits and vegetables each day. ? 3-4 servings of whole grains each day.  Avoiding foods and beverages that have added sugars.  Eating fish that contain healthy fats (omega-3 fatty acids), such as mackerel or salmon. If you need help putting together a healthy eating plan, try the DASH diet. This diet is high in fruits, vegetables, and whole grains. It is low in sodium, red meat, and added sugars. DASH stands for Dietary Approaches to Stop Hypertension.   Lifestyle changes Lose weight if you are overweight. Losing just 3?5% of your body weight can help prevent or control hypertension. For example, if your present weight is 200 lb (91 kg), a loss of 3-5% of your weight means losing 6-10 lb (2.7-4.5 kg). Ask your health care provider to help you with a diet and exercise plan to safely lose weight. Other recommendations usually include:  Get enough exercise. Do at least 150 minutes of moderate-intensity exercise each week. You could do this in short exercise sessions several times a day, or you could do longer exercise sessions a few times a week. For example, you could take a brisk 10-minute walk or bike ride, 3 times a day, for 5 days a week.  Find ways to reduce stress, such as exercising, meditating, listening to music, or taking a yoga class.  If you need help reducing stress, ask your health care provider.  Do not use any products that contain nicotine or tobacco, such as cigarettes, e-cigarettes, and chewing tobacco. If you need help quitting, ask your health care provider. Chemicals in tobacco and nicotine products raise your blood pressure each time you use them. If you need help quitting, ask your health  care provider.  Learn how to check your blood pressure at home. Make sure that you know your personal target blood pressure, as told by your health care provider.  Try to sleep 7-9 hours per night.   Alcohol use  Do not drink alcohol if: ? Your health care provider tells you not to drink. ? You are pregnant, may be pregnant, or are planning to become pregnant.  If you drink alcohol: ? Limit how much you use to:  0-1 drink a day for women.  0-2 drinks a day for men. ? Be aware of how much alcohol is in your drink. In the [place] one drink equals one 12 oz bottle of beer (355 mL), one 5 oz glass of wine (148 mL), or one 1 oz glass of hard liquor (44 mL). Medicines In addition to diet and lifestyle changes, your health care provider may recommend medicines to help lower your blood pressure. In general:  You may need to try a few different medicines to find what works best for you.  You may need to take more than one medicine.  Take over-the-counter and prescription medicines only as told by your health care provider. Questions to ask your health care provider  What is my blood pressure goal?  How can I lower my risk for high blood pressure?  How should I monitor my blood pressure at home? Where to find support Your health care provider can help you prevent hypertension and help you keep your blood pressure at a healthy level. Your local hospital or your community may also provide support services and prevention programs. The American Heart Association offers an online support network at supportnetwork.heart.org Where to find more information Learn more about hypertension from:  [place], Lung, and [place]: https://wilson-eaton.com/  Centers for Disease Control and Prevention: http://www.wolf.info/  American Academy of Family Physicians: familydoctor.org Learn more about the DASH diet from:  [place], Lung, and [place]: https://wilson-eaton.com/ Contact a health care  provider if:  You think you are having a reaction to medicines you have taken.  You have recurrent headaches or feel dizzy.  You have swelling in your ankles.  You have trouble with your vision. Get help right away if:  You have sudden, severe chest, back, or abdominal pain or discomfort.  You have shortness of breath.  You have a sudden, severe headache. These symptoms may represent a serious problem that is an emergency. Do not wait to see if the symptoms will go away. Get medical help right away. Call your local emergency services (911 in the [place] Do not drive yourself to the hospital.  Summary  Hypertension often does not cause any symptoms until blood pressure is very high. It is important to get your blood pressure checked regularly.  Diet and lifestyle changes are important steps in preventing hypertension.  By keeping your blood pressure in a healthy range, you may prevent complications like heart attack, heart failure, stroke, and kidney failure.  Work with your health care provider to make a hypertension prevention plan that works for you. This information is not intended to replace advice given to  you by your health care provider. Make sure you discuss any questions you have with your health care provider. Document Revised: 01/13/2019 Document Reviewed: 01/13/2019 Elsevier Patient Education  2021 [place]. Olmesartan Tablets What is this medicine? OLMESARTAN (all mi SAR tan) is an angiotensin II receptor blocker, also known as an ARB. It treats high blood pressure. This medicine may be used for other purposes; ask your health care provider or pharmacist if you have questions. COMMON BRAND NAME(S): Benicar What should I tell my health care provider before I take this medicine? They need to know if you have any of these conditions:  if you are on a special diet, such as a low-salt diet  kidney or liver disease  an unusual or allergic reaction to olmesartan,  other medicines, foods, dyes, or preservatives  pregnant or trying to get pregnant  breast-feeding How should I use this medicine? Take this drug by mouth. Take it as directed on the prescription label at the same time every day. You can take it with or without food. If it upsets your stomach, take it with food. Keep taking it unless your health care provider tells you to stop. Talk to your health care provider about the use of this drug in children. While it may be prescribed for children as young as 6 for selected conditions, precautions do apply. Overdosage: If you think you have taken too much of this medicine contact a poison control center or emergency room at once. NOTE: This medicine is only for you. Do not share this medicine with others. What if I miss a dose? If you miss a dose, take it as soon as you can. If it is almost time for your next dose, take only that dose. Do not take double or extra doses. What may interact with this medicine?  blood pressure medicines  diuretics, especially triamterene, spironolactone or amiloride  potassium salts or potassium supplements This list may not describe all possible interactions. Give your health care provider a list of all the medicines, herbs, non-prescription drugs, or dietary supplements you use. Also tell them if you smoke, drink alcohol, or use illegal drugs. Some items may interact with your medicine. What should I watch for while using this medicine? Visit your doctor or health care professional for regular checks on your progress. Check your blood pressure as directed. Ask your doctor or health care professional what your blood pressure should be and when you should contact him or her. Call your doctor or health care professional if you notice an irregular or fast heart beat. Women should inform their doctor if they wish to become pregnant or think they might be pregnant. There is a potential for serious side effects to an unborn  child, particularly in the second or third trimester. Talk to your health care professional or pharmacist for more information. You may get drowsy or dizzy. Do not drive, use machinery, or do anything that needs mental alertness until you know how this drug affects you. Do not stand or sit up quickly, especially if you are an older patient. This reduces the risk of dizzy or fainting spells. Alcohol can make you more drowsy and dizzy. Avoid alcoholic drinks. Avoid salt substitutes unless you are told otherwise by your doctor or health care professional. Do not treat yourself for coughs, colds, or pain while you are taking this medicine without asking your doctor or health care professional for advice. Some ingredients may increase your blood pressure. What side effects may  I notice from receiving this medicine? Side effects that you should report to your doctor or health care professional as soon as possible:  confusion, dizziness, light headedness or fainting spells  decreased amount of urine passed  diarrhea  difficulty breathing or swallowing, hoarseness, or tightening of the throat  fast or irregular heart beat, palpitations, or chest pain  skin rash, itching  swelling of your face, lips, tongue, hands, or feet  vomiting  weight loss Side effects that usually do not require medical attention (report to your doctor or health care professional if they continue or are bothersome):  cough  decreased sexual function or desire  headache  nasal congestion or stuffiness  nausea  sore or cramping muscles This list may not describe all possible side effects. Call your doctor for medical advice about side effects. You may report side effects to FDA at 1-800-FDA-1088. Where should I keep my medicine? Keep out of the reach of children and pets. Store at room temperature between 20 and 25 degrees C (68 and 77 degrees F). Throw away any unused drug after the expiration date. NOTE: This  sheet is a summary. It may not cover all possible information. If you have questions about this medicine, talk to your doctor, pharmacist, or health care provider.  2021 Elsevier/Gold Standard (2018-09-17 13:23:55)

## 2020-07-01 NOTE — Progress Notes (Addendum)
Established Patient Office Visit  Subjective:  Patient ID: Alejandra Hess, female    DOB: Apr 10, 1955  Age: 65 y.o. MRN: [id number]  CC:  Chief Complaint  Patient presents with  . Follow-up    Follow up on A1C, no concerns. Patient did have coffee with cream only this morning.     HPI Alejandra Hess presents for follow-up of diabetes, elevated cholesterol and elevated blood pressure.  No history of elevated blood pressure.  She has never been treated for it.  She has been able to lose weight with diet and exercise.  Needs to see the ophthalmologist.  She has borderline glaucoma she tells me her pressures tend to run around 20.  Alogliptin is running around $100 out-of-pocket monthly. She had mostly tolerated metformin but felt as though it wasn't effective for her.  There was chronic diarrhea with it.  She is planning on taking a cruise to the [place] at the end of this month.  Past Medical History:  Diagnosis Date  . Allergy   . Asthma   . DM (diabetes mellitus) ([place])   . GERD (gastroesophageal reflux disease)   . Heart murmur    as a child  . HLD (hyperlipidemia)     Past Surgical History:  Procedure Laterality Date  . [place] STUDY N/A 11/18/2019   Procedure: [place] STUDY;  Surgeon: Irving Copas., MD;  Location: [facility];  Service: Gastroenterology;  Laterality: N/A;  . ABDOMINAL HYSTERECTOMY    . AUGMENTATION MAMMAPLASTY Bilateral    @ 43 lift with implants  . BREAST EXCISIONAL BIOPSY Right    @ 53  . BREAST EXCISIONAL BIOPSY Left    @43 ?  . CHOLECYSTECTOMY    . COLONOSCOPY    . ESOPHAGEAL MANOMETRY N/A 11/18/2019   Procedure: ESOPHAGEAL MANOMETRY (EM);  Surgeon: Irving Copas., MD;  Location: [facility];  Service: Gastroenterology;  Laterality: N/A;  . UPPER GASTROINTESTINAL ENDOSCOPY      Family History  Problem Relation Age of Onset  . Hearing loss Mother   . Hyperlipidemia Mother   . Hypertension Mother   . Diabetes  Mother   . Heart attack Father   . Heart disease Father   . Hyperlipidemia Father   . Hypertension Father   . Melanoma Father   . Asthma Sister   . Hyperlipidemia Sister   . Hypertension Sister   . Hyperlipidemia Sister   . Breast cancer Sister 74  . Asthma Sister   . Depression Sister   . Heart attack Sister   . Heart disease Sister   . Hypertension Sister   . Diabetes Sister   . Breast cancer Maternal Aunt        over 59  . Breast cancer Paternal Aunt        over 60  . Breast cancer Paternal Grandmother        over 60   . Breast cancer Maternal Aunt        over 42   . Colon cancer Maternal Grandfather   . Esophageal cancer Neg Hx   . Inflammatory bowel disease Neg Hx   . Liver disease Neg Hx   . Pancreatic cancer Neg Hx   . Rectal cancer Neg Hx   . Stomach cancer Neg Hx     Social History   Socioeconomic History  . Marital status: Married    Spouse name: Not on file  . Number of children: 2  . Years of education:  Not on file  . Highest education level: Not on file  Occupational History  . Occupation: Therapist, sports  Tobacco Use  . Smoking status: Former Smoker    Types: Cigarettes    Quit date: 2013    Years since quitting: 9.3  . Smokeless tobacco: Never Used  Vaping Use  . Vaping Use: Never used  Substance and Sexual Activity  . Alcohol use: Yes    Comment: 2 glasses of red wine daily  . Drug use: Never  . Sexual activity: Yes    Partners: Male    Comment: 1st intercourse- 19, partners- 5-, MARRIED- 89  Other Topics Concern  . Not on file  Social History Narrative  . Not on file   Social Determinants of Health   Financial Resource Strain: Low Risk   . Difficulty of Paying Living Expenses: Not hard at all  Food Insecurity: No Food Insecurity  . Worried About Charity fundraiser in the Last Year: Never true  . Ran Out of Food in the Last Year: Never true  Transportation Needs: No Transportation Needs  . Lack of Transportation (Medical): No  . Lack of  Transportation (Non-Medical): No  Physical Activity: Insufficiently Active  . Days of Exercise per Week: 3 days  . Minutes of Exercise per Session: 30 min  Stress: No Stress Concern Present  . Feeling of Stress : Not at all  Social Connections: Moderately Isolated  . Frequency of Communication with Friends and Family: More than three times a week  . Frequency of Social Gatherings with Friends and Family: Once a week  . Attends Religious Services: Never  . Active Member of Clubs or Organizations: No  . Attends Archivist Meetings: Never  . Marital Status: Married  Human resources officer Violence: Not At Risk  . Fear of Current or Ex-Partner: No  . Emotionally Abused: No  . Physically Abused: No  . Sexually Abused: No    Outpatient Medications Prior to Visit  Medication Sig Dispense Refill  . albuterol (PROVENTIL) (2.5 MG/3ML) 0.083% nebulizer solution PRN  7  . albuterol (VENTOLIN HFA) 108 (90 Base) MCG/ACT inhaler Inhale into the lungs. PRN    . Alogliptin Benzoate 25 MG TABS TAKE 1 TABLET BY MOUTH EVERY MORNING 30 tablet 0  . budesonide-formoterol (SYMBICORT) 160-4.5 MCG/ACT inhaler Inhale 1 puff into the lungs at bedtime as needed. 1 Inhaler 3  . cetirizine (ZYRTEC) 10 MG tablet Take 10 mg by mouth daily.    . famotidine (PEPCID) 20 MG tablet Take 1 tablet (20 mg total) by mouth at bedtime. 90 tablet 0  . Fluocinolone Acetonide 0.01 % OIL Place 4 drops into both ears daily as needed. 20 mL 2  . magnesium oxide (MAG-OX) 400 MG tablet Take 400 mg by mouth daily.     . Multiple Vitamin (MULTIVITAMIN) tablet Take 1 tablet by mouth daily.    . Probiotic Product (PROBIOTIC PO) Take 1 capsule by mouth 2 (two) times daily.     [name] triamcinolone (NASACORT) 55 MCG/ACT AERO nasal inhaler Place 1 spray into the nose daily.    . valACYclovir (VALTREX) 1000 MG tablet Take 1 tablet (1,000 mg total) by mouth 3 (three) times daily. 21 tablet 0  . venlafaxine (EFFEXOR) 50 MG tablet Take 1 tablet  (50 mg total) by mouth daily. 90 tablet 0  . pantoprazole (PROTONIX) 40 MG tablet Take 1 tablet (40 mg total) by mouth daily. 90 tablet 3  . predniSONE (DELTASONE) 20 MG tablet Take  2 tablets (40 mg total) by mouth daily with breakfast. 10 tablet 0  . simvastatin (ZOCOR) 40 MG tablet Take 1 tablet (40 mg total) by mouth at bedtime. 90 tablet 0  . gabapentin (NEURONTIN) 300 MG capsule Take 1 capsule (300 mg total) by mouth 2 (two) times daily. (Patient not taking: Reported on 07/01/2020) 30 capsule 0   No facility-administered medications prior to visit.    Allergies  Allergen Reactions  . Azithromycin Rash  . Penicillins Hives and Rash    ROS Review of Systems  Constitutional: Negative.   HENT: Negative.   Eyes: Negative for photophobia and visual disturbance.  Respiratory: Negative.   Cardiovascular: Negative.   Gastrointestinal: Negative.   Endocrine: Negative for polyphagia and polyuria.  Genitourinary: Negative.   Musculoskeletal: Negative for gait problem and joint swelling.  Skin: Negative.   Allergic/Immunologic: Negative for immunocompromised state.  Neurological: Negative for light-headedness and numbness.  Psychiatric/Behavioral: Negative.       Objective:    Physical Exam Vitals and nursing note reviewed.  Constitutional:      General: She is not in acute distress.    Appearance: Normal appearance. She is normal weight. She is not ill-appearing, toxic-appearing or diaphoretic.  HENT:     Head: Normocephalic and atraumatic.     Right Ear: Tympanic membrane, ear canal and external ear normal.     Left Ear: Tympanic membrane, ear canal and external ear normal.     Mouth/Throat:     Mouth: Mucous membranes are moist.     Pharynx: Oropharynx is clear. No oropharyngeal exudate or posterior oropharyngeal erythema.  Eyes:     General: No scleral icterus.       Right eye: No discharge.        Left eye: No discharge.     Conjunctiva/sclera: Conjunctivae normal.      Pupils: Pupils are equal, round, and reactive to light.  Neck:     Vascular: No carotid bruit.  Cardiovascular:     Rate and Rhythm: Normal rate and regular rhythm.     Pulses:          Carotid pulses are 2+ on the right side and 2+ on the left side.      Dorsalis pedis pulses are 2+ on the right side and 2+ on the left side.       Posterior tibial pulses are 2+ on the right side and 2+ on the left side.  Pulmonary:     Effort: Pulmonary effort is normal.     Breath sounds: Normal breath sounds.  Musculoskeletal:     Cervical back: No rigidity or tenderness.  Lymphadenopathy:     Cervical: No cervical adenopathy.  Skin:    General: Skin is warm and dry.  Neurological:     Mental Status: She is alert and oriented to person, place, and time.  Psychiatric:        Mood and Affect: Mood normal.        Behavior: Behavior normal.    Diabetic Foot Exam - Simple   Simple Foot Form Diabetic Foot exam was performed with the following findings: Yes 07/01/2020 11:00 AM  Visual Inspection No deformities, no ulcerations, no other skin breakdown bilaterally: Yes Sensation Testing Intact to touch and monofilament testing bilaterally: Yes Pulse Check Posterior Tibialis and Dorsalis pulse intact bilaterally: Yes Comments  Feet are cavus bilaterally.  There are no lesions or rashes.  Both great toes are status post nail removal 3 months ago.  Appear to be growing back normally.     BP (!) 154/78   Pulse 80   Temp (!) 97.4 F (36.3 C) (Temporal)   Ht 5\' 4"  (1.626 m)   Wt 166 lb 12.8 oz (75.7 kg)   SpO2 95%   BMI 28.63 kg/m  Wt Readings from Last 3 Encounters:  07/01/20 166 lb 12.8 oz (75.7 kg)  02/24/20 171 lb 6.4 oz (77.7 kg)  01/14/20 172 lb 2 oz (78.1 kg)     Health Maintenance Due  Topic Date Due  . TETANUS/TDAP  Never done  . OPHTHALMOLOGY EXAM  11/17/2019    There are no preventive care reminders to display for this patient.  Lab Results  Component Value Date   TSH  1.53 01/20/2019   Lab Results  Component Value Date   WBC 6.1 07/01/2020   HGB 14.0 07/01/2020   HCT 41.3 07/01/2020   MCV 90.9 07/01/2020   PLT 278.0 07/01/2020   Lab Results  Component Value Date   NA 139 07/01/2020   K 3.9 07/01/2020   CO2 27 07/01/2020   GLUCOSE 159 (H) 07/01/2020   BUN 12 07/01/2020   CREATININE 0.70 07/01/2020   BILITOT 0.5 07/01/2020   ALKPHOS 75 07/01/2020   AST 28 07/01/2020   ALT 50 (H) 07/01/2020   PROT 7.3 07/01/2020   ALBUMIN 4.6 07/01/2020   CALCIUM 9.8 07/01/2020   GFR 89.80 07/01/2020   Lab Results  Component Value Date   CHOL 164 12/09/2019   Lab Results  Component Value Date   HDL 57.10 12/09/2019   Lab Results  Component Value Date   LDLCALC 90 12/09/2019   Lab Results  Component Value Date   TRIG 87.0 12/09/2019   Lab Results  Component Value Date   CHOLHDL 3 12/09/2019   Lab Results  Component Value Date   HGBA1C 7.8 (H) 07/01/2020      Assessment & Plan:   Problem List Items Addressed This Visit      Endocrine   Controlled type 2 diabetes mellitus without complication, without long-term current use of insulin (HCC) - Primary   Relevant Medications   olmesartan (BENICAR) 5 MG tablet   rosuvastatin (CRESTOR) 20 MG tablet   Other Relevant Orders   Comprehensive metabolic panel (Completed)   Hemoglobin A1c (Completed)   Urinalysis, Routine w reflex microscopic (Completed)   Microalbumin / creatinine urine ratio (Completed)   Ambulatory referral to Ophthalmology   Ambulatory referral to Endocrinology     Other   Elevated LDL cholesterol level   Relevant Medications   rosuvastatin (CRESTOR) 20 MG tablet   Other Relevant Orders   Comprehensive metabolic panel (Completed)   LDL cholesterol, direct (Completed)    Other Visit Diagnoses    Essential hypertension       Relevant Medications   olmesartan (BENICAR) 5 MG tablet   rosuvastatin (CRESTOR) 20 MG tablet   Other Relevant Orders   CBC (Completed)    Comprehensive metabolic panel (Completed)      Meds ordered this encounter  Medications  . olmesartan (BENICAR) 5 MG tablet    Sig: Take 1 tablet (5 mg total) by mouth daily.    Dispense:  90 tablet    Refill:  1  . rosuvastatin (CRESTOR) 20 MG tablet    Sig: Take 1 tablet (20 mg total) by mouth daily.    Dispense:  90 tablet    Refill:  3    Follow-up: Return in about 3 months (  around 10/01/2020).   Hopefully hemoglobin A1c will be lower with her weight loss and exercise.  Cost of medicines is a concern. I believe that metformin is still an option for her but she does not.  We will start with a low-dose of Benicar for her blood pressure and to help preserve her renal function.  Checking LDL today P.  She is nonfasting.  Encouraged her to continue exercising and losing weight. [name], MD   5/9 addendum: Ldl elevated with zocor. Patient reports compliance. Will switch to crestor. Will offer referral to endocrinology for DM>.

## 2020-07-04 ENCOUNTER — Encounter: Payer: Self-pay | Admitting: Family Medicine

## 2020-07-04 MED ORDER — ROSUVASTATIN CALCIUM 20 MG PO TABS: 20.0000 mg | ORAL_TABLET | Freq: Every day | ORAL | 3 refills | Status: DC

## 2020-07-04 NOTE — Addendum Note (Signed)
Addended by: Jon Billings on: 07/04/2020 05:02 PM   Modules accepted: Orders

## 2020-07-05 NOTE — Addendum Note (Signed)
Addended by: Jon Billings on: 07/05/2020 10:16 AM   Modules accepted: Orders

## 2020-07-15 ENCOUNTER — Other Ambulatory Visit: Payer: Self-pay | Admitting: Family Medicine

## 2020-07-21 ENCOUNTER — Encounter: Payer: Self-pay | Admitting: Family Medicine

## 2020-07-21 DIAGNOSIS — K120 Recurrent oral aphthae: Secondary | ICD-10-CM

## 2020-07-21 DIAGNOSIS — Z1152 Encounter for screening for COVID-19: Secondary | ICD-10-CM | POA: Diagnosis not present

## 2020-07-21 MED ORDER — TRIAMCINOLONE ACETONIDE 0.1 % MT PSTE: PASTE | OROMUCOSAL | 1 refills | Status: DC

## 2020-08-06 ENCOUNTER — Other Ambulatory Visit: Payer: Self-pay | Admitting: Family Medicine

## 2020-08-06 DIAGNOSIS — E119 Type 2 diabetes mellitus without complications: Secondary | ICD-10-CM

## 2020-08-19 DIAGNOSIS — D2262 Melanocytic nevi of left upper limb, including shoulder: Secondary | ICD-10-CM | POA: Diagnosis not present

## 2020-08-19 DIAGNOSIS — L570 Actinic keratosis: Secondary | ICD-10-CM | POA: Diagnosis not present

## 2020-08-19 DIAGNOSIS — D2222 Melanocytic nevi of left ear and external auricular canal: Secondary | ICD-10-CM | POA: Diagnosis not present

## 2020-08-19 DIAGNOSIS — R21 Rash and other nonspecific skin eruption: Secondary | ICD-10-CM | POA: Diagnosis not present

## 2020-08-19 DIAGNOSIS — D225 Melanocytic nevi of trunk: Secondary | ICD-10-CM | POA: Diagnosis not present

## 2020-08-19 DIAGNOSIS — L821 Other seborrheic keratosis: Secondary | ICD-10-CM | POA: Diagnosis not present

## 2020-08-19 DIAGNOSIS — L820 Inflamed seborrheic keratosis: Secondary | ICD-10-CM | POA: Diagnosis not present

## 2020-08-19 DIAGNOSIS — D235 Other benign neoplasm of skin of trunk: Secondary | ICD-10-CM | POA: Diagnosis not present

## 2020-08-19 DIAGNOSIS — D1801 Hemangioma of skin and subcutaneous tissue: Secondary | ICD-10-CM | POA: Diagnosis not present

## 2020-08-19 DIAGNOSIS — D2261 Melanocytic nevi of right upper limb, including shoulder: Secondary | ICD-10-CM | POA: Diagnosis not present

## 2020-08-19 DIAGNOSIS — L814 Other melanin hyperpigmentation: Secondary | ICD-10-CM | POA: Diagnosis not present

## 2020-08-25 ENCOUNTER — Telehealth: Payer: Self-pay | Admitting: *Deleted

## 2020-08-25 NOTE — Chronic Care Management (AMB) (Signed)
  Chronic Care Management   Note  08/25/2020 Name: Tajia Szeliga MRN: [id number] DOB: Feb 12, 1956  Marguita Venning is a 65 y.o. year old female who is a primary care patient of [name], MD. I reached out to [facility] by phone today in response to a referral sent by Ms. Silvia Pendergrass's PCP [name], MD     Ms. Mittman was given information about Chronic Care Management services today including:  CCM service includes personalized support from designated clinical staff supervised by her physician, including individualized plan of care and coordination with other care providers 24/7 contact phone numbers for assistance for urgent and routine care needs. Service will only be billed when office clinical staff spend 20 minutes or more in a month to coordinate care. Only one practitioner may furnish and bill the service in a calendar month. The patient may stop CCM services at any time (effective at the end of the month) by phone call to the office staff. The patient will be responsible for cost sharing (co-pay) of up to 20% of the service fee (after annual deductible is met).  Patient agreed to services and verbal consent obtained.   Follow up plan: Telephone appointment with care management team member scheduled for:09/21/2020  Julian Hy, [place], [place] [facility]  Direct Dial: [phone]

## 2020-08-26 ENCOUNTER — Other Ambulatory Visit: Payer: Self-pay

## 2020-08-26 DIAGNOSIS — E119 Type 2 diabetes mellitus without complications: Secondary | ICD-10-CM

## 2020-08-26 MED ORDER — ALOGLIPTIN BENZOATE 25 MG PO TABS: 1.0000 | ORAL_TABLET | Freq: Every morning | ORAL | 0 refills | Status: DC

## 2020-09-06 ENCOUNTER — Encounter: Payer: Self-pay | Admitting: Family Medicine

## 2020-09-06 ENCOUNTER — Telehealth: Payer: Medicare Other | Admitting: Family Medicine

## 2020-09-06 ENCOUNTER — Telehealth: Payer: Self-pay

## 2020-09-06 ENCOUNTER — Telehealth (INDEPENDENT_AMBULATORY_CARE_PROVIDER_SITE_OTHER): Payer: Medicare Other | Admitting: Family Medicine

## 2020-09-06 VITALS — Temp 99.0°F | Wt 160.0 lb

## 2020-09-06 DIAGNOSIS — U071 COVID-19: Secondary | ICD-10-CM

## 2020-09-06 MED ORDER — NIRMATRELVIR/RITONAVIR (PAXLOVID)TABLET: 3.0000 | ORAL_TABLET | Freq: Two times a day (BID) | ORAL | 0 refills | Status: AC

## 2020-09-06 MED ORDER — ONDANSETRON HCL 4 MG PO TABS: 4.0000 mg | ORAL_TABLET | Freq: Three times a day (TID) | ORAL | 0 refills | Status: DC | PRN

## 2020-09-06 NOTE — Telephone Encounter (Signed)
Pt tested positive this morning for Covid. She is having sore throat, headache, ear pain, sinus pain. She wants something sent in. I scheduled a MyChart visit with Dr Maudie Mercury at 5 today. Pt isn't happy waiting that long. Wants to know if Dr Ethelene Hal can call her something.  Please advise  Thank you

## 2020-09-06 NOTE — Progress Notes (Signed)
Established Patient Office Visit  Subjective:  Patient ID: Alejandra Hess, female    DOB: Nov 28, 1955  Age: 65 y.o. MRN: [id number]  CC:  Chief Complaint  Patient presents with   Headache    Sore throat, headaches, ears clogged up, sinuses symptoms started last night. Positive for covid this morning.     HPI Alejandra Hess presents for a one day history of ear congestion, nasal congested, pnd, sore throat and head ache. Without cough or wheezing.  She is aching all over.  She has had no diarrhea.  Test for COVID positive x2.  Past Medical History:  Diagnosis Date   Allergy    Asthma    DM (diabetes mellitus) (HCC)    GERD (gastroesophageal reflux disease)    Heart murmur    as a child   HLD (hyperlipidemia)     Past Surgical History:  Procedure Laterality Date   74 HOUR PH STUDY N/A 11/18/2019   Procedure: 24 HOUR PH STUDY;  Surgeon: Irving Copas., MD;  Location: [facility];  Service: Gastroenterology;  Laterality: N/A;   ABDOMINAL HYSTERECTOMY     AUGMENTATION MAMMAPLASTY Bilateral    @ 43 lift with implants   BREAST EXCISIONAL BIOPSY Right    @ 53   BREAST EXCISIONAL BIOPSY Left    @43 ?   CHOLECYSTECTOMY     COLONOSCOPY     ESOPHAGEAL MANOMETRY N/A 11/18/2019   Procedure: ESOPHAGEAL MANOMETRY (EM);  Surgeon: Irving Copas., MD;  Location: [facility];  Service: Gastroenterology;  Laterality: N/A;   UPPER GASTROINTESTINAL ENDOSCOPY      Family History  Problem Relation Age of Onset   Hearing loss Mother    Hyperlipidemia Mother    Hypertension Mother    Diabetes Mother    Heart attack Father    Heart disease Father    Hyperlipidemia Father    Hypertension Father    Melanoma Father    Asthma Sister    Hyperlipidemia Sister    Hypertension Sister    Hyperlipidemia Sister    Breast cancer Sister 79   Asthma Sister    Depression Sister    Heart attack Sister    Heart disease Sister    Hypertension Sister    Diabetes Sister     Breast cancer Maternal Aunt        over 67   Breast cancer Paternal Aunt        over 77   Breast cancer Paternal Grandmother        over 8    Breast cancer Maternal Aunt        over 58    Colon cancer Maternal Grandfather    Esophageal cancer Neg Hx    Inflammatory bowel disease Neg Hx    Liver disease Neg Hx    Pancreatic cancer Neg Hx    Rectal cancer Neg Hx    Stomach cancer Neg Hx     Social History   Socioeconomic History   Marital status: Married    Spouse name: Not on file   Number of children: 2   Years of education: Not on file   Highest education level: Not on file  Occupational History   Occupation: RN  Tobacco Use   Smoking status: Former    Pack years: 0.00    Types: Cigarettes    Quit date: 2013    Years since quitting: 9.5   Smokeless tobacco: Never  Vaping Use   Vaping Use: Never used  Substance  and Sexual Activity   Alcohol use: Yes    Comment: 2 glasses of red wine daily   Drug use: Never   Sexual activity: Yes    Partners: Male    Comment: 1st intercourse- 89, partners- 5-, MARRIED- 38  Other Topics Concern   Not on file  Social History Narrative   Not on file   Social Determinants of Health   Financial Resource Strain: Low Risk    Difficulty of Paying Living Expenses: Not hard at all  Food Insecurity: No Food Insecurity   Worried About Charity fundraiser in the Last Year: Never true   [place] in the Last Year: Never true  Transportation Needs: No Transportation Needs   Lack of Transportation (Medical): No   Lack of Transportation (Non-Medical): No  Physical Activity: Insufficiently Active   Days of Exercise per Week: 3 days   Minutes of Exercise per Session: 30 min  Stress: No Stress Concern Present   Feeling of Stress : Not at all  Social Connections: Moderately Isolated   Frequency of Communication with Friends and Family: More than three times a week   Frequency of Social Gatherings with Friends and Family: Once a week    Attends Religious Services: Never   Marine scientist or Organizations: No   Attends Music therapist: Never   Marital Status: Married  Human resources officer Violence: Not At Risk   Fear of Current or Ex-Partner: No   Emotionally Abused: No   Physically Abused: No   Sexually Abused: No    Outpatient Medications Prior to Visit  Medication Sig Dispense Refill   albuterol (PROVENTIL) (2.5 MG/3ML) 0.083% nebulizer solution PRN  7   albuterol (VENTOLIN HFA) 108 (90 Base) MCG/ACT inhaler Inhale into the lungs. PRN     Alogliptin Benzoate 25 MG TABS Take 1 tablet by mouth every morning. 30 tablet 0   budesonide-formoterol (SYMBICORT) 160-4.5 MCG/ACT inhaler Inhale 1 puff into the lungs at bedtime as needed. 1 Inhaler 3   cetirizine (ZYRTEC) 10 MG tablet Take 10 mg by mouth daily.     Fluocinolone Acetonide 0.01 % OIL Place 4 drops into both ears daily as needed. 20 mL 2   magnesium oxide (MAG-OX) 400 MG tablet Take 400 mg by mouth daily.      Multiple Vitamin (MULTIVITAMIN) tablet Take 1 tablet by mouth daily.     olmesartan (BENICAR) 5 MG tablet Take 1 tablet (5 mg total) by mouth daily. 90 tablet 1   Probiotic Product (PROBIOTIC PO) Take 1 capsule by mouth 2 (two) times daily.      rosuvastatin (CRESTOR) 20 MG tablet Take 1 tablet (20 mg total) by mouth daily. 90 tablet 3   triamcinolone (KENALOG) 0.1 % paste Apply a thin coat to sores twice daily. 5 g 1   triamcinolone (NASACORT) 55 MCG/ACT AERO nasal inhaler Place 1 spray into the nose daily.     valACYclovir (VALTREX) 1000 MG tablet Take 1 tablet (1,000 mg total) by mouth 3 (three) times daily. 21 tablet 0   venlafaxine (EFFEXOR) 50 MG tablet Take 1 tablet (50 mg total) by mouth daily. 90 tablet 0   famotidine (PEPCID) 20 MG tablet TAKE 1 TABLET(20 MG) BY MOUTH AT BEDTIME (Patient not taking: Reported on 09/06/2020) 90 tablet 0   No facility-administered medications prior to visit.    Allergies  Allergen Reactions    Azithromycin Rash   Penicillins Hives and Rash    ROS  Review of Systems  Constitutional:  Positive for fatigue. Negative for diaphoresis, fever and unexpected weight change.  HENT:  Positive for congestion, postnasal drip and sore throat. Negative for voice change.   Eyes:  Negative for photophobia and visual disturbance.  Respiratory:  Negative for chest tightness, shortness of breath and wheezing.   Genitourinary: Negative.   Musculoskeletal:  Positive for arthralgias and myalgias.  Skin: Negative.   Neurological:  Positive for headaches.  Psychiatric/Behavioral: Negative.       Objective:    Physical Exam Vitals and nursing note reviewed.  HENT:     Head: Normocephalic and atraumatic.  Pulmonary:     Effort: Pulmonary effort is normal.  Neurological:     Mental Status: She is alert and oriented to person, place, and time.  Psychiatric:        Mood and Affect: Mood normal.        Behavior: Behavior normal.    Temp 99 F (37.2 C) (Temporal)   Wt 160 lb (72.6 kg)   BMI 27.46 kg/m  Wt Readings from Last 3 Encounters:  09/06/20 160 lb (72.6 kg)  07/01/20 166 lb 12.8 oz (75.7 kg)  02/24/20 171 lb 6.4 oz (77.7 kg)     Health Maintenance Due  Topic Date Due   TETANUS/TDAP  Never done   Zoster Vaccines- Shingrix (1 of 2) Never done   OPHTHALMOLOGY EXAM  11/17/2019    There are no preventive care reminders to display for this patient.  Lab Results  Component Value Date   TSH 1.53 01/20/2019   Lab Results  Component Value Date   WBC 6.1 07/01/2020   HGB 14.0 07/01/2020   HCT 41.3 07/01/2020   MCV 90.9 07/01/2020   PLT 278.0 07/01/2020   Lab Results  Component Value Date   NA 139 07/01/2020   K 3.9 07/01/2020   CO2 27 07/01/2020   GLUCOSE 159 (H) 07/01/2020   BUN 12 07/01/2020   CREATININE 0.70 07/01/2020   BILITOT 0.5 07/01/2020   ALKPHOS 75 07/01/2020   AST 28 07/01/2020   ALT 50 (H) 07/01/2020   PROT 7.3 07/01/2020   ALBUMIN 4.6 07/01/2020    CALCIUM 9.8 07/01/2020   GFR 89.80 07/01/2020   Lab Results  Component Value Date   CHOL 164 12/09/2019   Lab Results  Component Value Date   HDL 57.10 12/09/2019   Lab Results  Component Value Date   LDLCALC 90 12/09/2019   Lab Results  Component Value Date   TRIG 87.0 12/09/2019   Lab Results  Component Value Date   CHOLHDL 3 12/09/2019   Lab Results  Component Value Date   HGBA1C 7.8 (H) 07/01/2020      Assessment & Plan:   Problem List Items Addressed This Visit   None Visit Diagnoses     COVID-19    -  Primary   Relevant Medications   nirmatrelvir/ritonavir EUA (PAXLOVID) TABS   ondansetron (ZOFRAN) 4 MG tablet       Meds ordered this encounter  Medications   nirmatrelvir/ritonavir EUA (PAXLOVID) TABS    Sig: Take 3 tablets by mouth 2 (two) times daily for 5 days. (Take nirmatrelvir 150 mg two tablets twice daily for 5 days and ritonavir 100 mg one tablet twice daily for 5 days) Patient GFR is 89    Dispense:  30 tablet    Refill:  0   ondansetron (ZOFRAN) 4 MG tablet    Sig: Take 1 tablet (4 mg  total) by mouth every 8 (eight) hours as needed for nausea or vomiting.    Dispense:  20 tablet    Refill:  0    Follow-up: Return in about 1 week (around 09/13/2020), or if symptoms worsen or fail to improve.   Will start back Paxlovid today use Zofran as needed nausea.  Follow-up in 1 week if not improving.  Quarantine for 5 days. [name], MD  Virtual Visit via Video Note  I connected with Alejandra Hess on 09/06/20 at  2:00 PM EDT by a video enabled telemedicine application and verified that I am speaking with the correct person using two identifiers.  Location: Patient: home with husband.  Provider: work   I discussed the limitations of evaluation and management by telemedicine and the availability of in person appointments. The patient expressed understanding and agreed to proceed.  History of Present Illness:     Observations/Objective:   Assessment and Plan:   Follow Up Instructions:    I discussed the assessment and treatment plan with the patient. The patient was provided an opportunity to ask questions and all were answered. The patient agreed with the plan and demonstrated an understanding of the instructions.   The patient was advised to call back or seek an in-person evaluation if the symptoms worsen or if the condition fails to improve as anticipated.  I provided 20 minutes of non-face-to-face time during this encounter.   [name], MD

## 2020-09-06 NOTE — Telephone Encounter (Signed)
Virtual visit scheduled for patient

## 2020-09-19 ENCOUNTER — Other Ambulatory Visit: Payer: Self-pay

## 2020-09-19 ENCOUNTER — Ambulatory Visit (INDEPENDENT_AMBULATORY_CARE_PROVIDER_SITE_OTHER): Payer: Medicare Other | Admitting: Endocrinology

## 2020-09-19 VITALS — BP 160/74 | HR 89 | Ht 64.0 in | Wt 166.4 lb

## 2020-09-19 DIAGNOSIS — E119 Type 2 diabetes mellitus without complications: Secondary | ICD-10-CM | POA: Diagnosis not present

## 2020-09-19 DIAGNOSIS — E278 Other specified disorders of adrenal gland: Secondary | ICD-10-CM | POA: Diagnosis not present

## 2020-09-19 LAB — POCT GLYCOSYLATED HEMOGLOBIN (HGB A1C): Hemoglobin A1C: 7.7 % — AB (ref 4.0–5.6)

## 2020-09-19 MED ORDER — REPAGLINIDE 0.5 MG PO TABS: 0.5000 mg | ORAL_TABLET | Freq: Two times a day (BID) | ORAL | 11 refills | Status: DC

## 2020-09-19 MED ORDER — ONETOUCH VERIO VI STRP
1.0000 | ORAL_STRIP | Freq: Every day | 12 refills | Status: DC
Start: 2020-09-19 — End: 2020-09-23

## 2020-09-19 NOTE — Patient Instructions (Signed)
good diet and exercise significantly improve the control of your diabetes.  please let me know if you wish to be referred to a dietician.  high blood sugar is very risky to your health.  you should see an eye doctor and dentist every year.  It is very important to get all recommended vaccinations.  Controlling your blood pressure and cholesterol drastically reduces the damage diabetes does to your body.  Those who smoke should quit.  Please discuss these with your doctor.  Here is a new meter.  I have sent a prescription to your pharmacy, for strips.  check your blood sugar here is a meter a day.  vary the time of day when you check, between before the 3 meals, and at bedtime.  also check if you have symptoms of your blood sugar being too high or too low.  please keep a record of the readings and bring it to your next appointment here (or you can bring the meter itself).  You can write it on any piece of paper.  please call us sooner if your blood sugar goes below 70, or if most of your readings are over 200. I have sent a prescription to your pharmacy, to change the alogliptin to repaglinide. Please come back for a follow-up appointment in 3 months.

## 2020-09-19 NOTE — Progress Notes (Signed)
Subjective:    Patient ID: Alejandra Hess, female    DOB: [birth year], 65 y.o.   MRN: [id number]  HPI pt is referred by Dr Ethelene Hal, for diabetes.  Pt states DM was dx'ed in 2020; she is unaware of any chronic complications; she has never been on insulin; pt says her diet and exercise are good; she has never had GDM, pancreatitis, pancreatic surgery, severe hypoglycemia or DKA.  She has gained 20 lbs x 3 years.  Metformin-XR caused abd pain and diarrhea.  Nesina did not help, so she stopped 4 days ago.  She stopped [place], due to cost.  GERD is well-controlled.   Past Medical History:  Diagnosis Date   Allergy    Asthma    DM (diabetes mellitus) (HCC)    GERD (gastroesophageal reflux disease)    Heart murmur    as a child   HLD (hyperlipidemia)     Past Surgical History:  Procedure Laterality Date   44 HOUR PH STUDY N/A 11/18/2019   Procedure: 24 HOUR PH STUDY;  Surgeon: Irving Copas., MD;  Location: [facility];  Service: Gastroenterology;  Laterality: N/A;   ABDOMINAL HYSTERECTOMY     AUGMENTATION MAMMAPLASTY Bilateral    @ 43 lift with implants   BREAST EXCISIONAL BIOPSY Right    @ 53   BREAST EXCISIONAL BIOPSY Left    '@43'$ ?   CHOLECYSTECTOMY     COLONOSCOPY     ESOPHAGEAL MANOMETRY N/A 11/18/2019   Procedure: ESOPHAGEAL MANOMETRY (EM);  Surgeon: Irving Copas., MD;  Location: [facility];  Service: Gastroenterology;  Laterality: N/A;   UPPER GASTROINTESTINAL ENDOSCOPY      Social History   Socioeconomic History   Marital status: Married    Spouse name: Not on file   Number of children: 2   Years of education: Not on file   Highest education level: Not on file  Occupational History   Occupation: RN  Tobacco Use   Smoking status: Former    Types: Cigarettes    Quit date: 2013    Years since quitting: 9.5   Smokeless tobacco: Never  Vaping Use   Vaping Use: Never used  Substance and Sexual Activity   Alcohol use: Yes    Comment: 2 glasses of  red wine daily   Drug use: Never   Sexual activity: Yes    Partners: Male    Comment: 1st intercourse- 15, partners- 51-, MARRIED- 25  Other Topics Concern   Not on file  Social History Narrative   Not on file   Social Determinants of Health   Financial Resource Strain: Low Risk    Difficulty of Paying Living Expenses: Not hard at all  Food Insecurity: No Food Insecurity   Worried About Charity fundraiser in the Last Year: Never true   [place] in the Last Year: Never true  Transportation Needs: No Transportation Needs   Lack of Transportation (Medical): No   Lack of Transportation (Non-Medical): No  Physical Activity: Insufficiently Active   Days of Exercise per Week: 3 days   Minutes of Exercise per Session: 30 min  Stress: No Stress Concern Present   Feeling of Stress : Not at all  Social Connections: Moderately Isolated   Frequency of Communication with Friends and Family: More than three times a week   Frequency of Social Gatherings with Friends and Family: Once a week   Attends Religious Services: Never   Marine scientist or Organizations: No  Attends Archivist Meetings: Never   Marital Status: Married  Human resources officer Violence: Not At Risk   Fear of Current or Ex-Partner: No   Emotionally Abused: No   Physically Abused: No   Sexually Abused: No    Current Outpatient Medications on File Prior to Visit  Medication Sig Dispense Refill   albuterol (PROVENTIL) (2.5 MG/3ML) 0.083% nebulizer solution PRN  7   albuterol (VENTOLIN HFA) 108 (90 Base) MCG/ACT inhaler Inhale into the lungs. PRN     budesonide-formoterol (SYMBICORT) 160-4.5 MCG/ACT inhaler Inhale 1 puff into the lungs at bedtime as needed. 1 Inhaler 3   cetirizine (ZYRTEC) 10 MG tablet Take 10 mg by mouth daily.     Fluocinolone Acetonide 0.01 % OIL Place 4 drops into both ears daily as needed. 20 mL 2   magnesium oxide (MAG-OX) 400 MG tablet Take 400 mg by mouth daily.      Multiple  Vitamin (MULTIVITAMIN) tablet Take 1 tablet by mouth daily.     olmesartan (BENICAR) 5 MG tablet Take 1 tablet (5 mg total) by mouth daily. 90 tablet 1   ondansetron (ZOFRAN) 4 MG tablet Take 1 tablet (4 mg total) by mouth every 8 (eight) hours as needed for nausea or vomiting. 20 tablet 0   Probiotic Product (PROBIOTIC PO) Take 1 capsule by mouth 2 (two) times daily.      rosuvastatin (CRESTOR) 20 MG tablet Take 1 tablet (20 mg total) by mouth daily. 90 tablet 3   triamcinolone (KENALOG) 0.1 % paste Apply a thin coat to sores twice daily. 5 g 1   triamcinolone (NASACORT) 55 MCG/ACT AERO nasal inhaler Place 1 spray into the nose daily.     valACYclovir (VALTREX) 1000 MG tablet Take 1 tablet (1,000 mg total) by mouth 3 (three) times daily. 21 tablet 0   venlafaxine (EFFEXOR) 50 MG tablet Take 1 tablet (50 mg total) by mouth daily. 90 tablet 0   No current facility-administered medications on file prior to visit.    Allergies  Allergen Reactions   Azithromycin Rash   Penicillins Hives and Rash    Family History  Problem Relation Age of Onset   Hearing loss Mother    Hyperlipidemia Mother    Hypertension Mother    Diabetes Mother    Heart attack Father    Heart disease Father    Hyperlipidemia Father    Hypertension Father    Melanoma Father    Asthma Sister    Hyperlipidemia Sister    Hypertension Sister    Hyperlipidemia Sister    Breast cancer Sister 52   Asthma Sister    Depression Sister    Heart attack Sister    Heart disease Sister    Hypertension Sister    Diabetes Sister    Breast cancer Maternal Aunt        over 72   Breast cancer Paternal Aunt        over 47   Breast cancer Paternal Grandmother        over 75    Breast cancer Maternal Aunt        over 35    Colon cancer Maternal Grandfather    Esophageal cancer Neg Hx    Inflammatory bowel disease Neg Hx    Liver disease Neg Hx    Pancreatic cancer Neg Hx    Rectal cancer Neg Hx    Stomach cancer Neg Hx      BP (!) 160/74 (BP Location: Right  Arm, Patient Position: Sitting, Cuff Size: Normal)   Pulse 89   Ht '5\' 4"'$  (1.626 m)   Wt 166 lb 6.4 oz (75.5 kg)   SpO2 97%   BMI 28.56 kg/m     Review of Systems denies sob, n/v, memory loss, and depression.      Objective:   Physical Exam Pulses: dorsalis pedis intact bilat.   MSK: no deformity of the feet CV: no leg edema Skin:  no ulcer on the feet.  normal color and temp on the feet. Neuro: sensation is intact to touch on the feet.    Lab Results  Component Value Date   HGBA1C 7.8 (H) 07/01/2020   Lab Results  Component Value Date   CREATININE 0.70 07/01/2020   BUN 12 07/01/2020   NA 139 07/01/2020   K 3.9 07/01/2020   CL 103 07/01/2020   CO2 27 07/01/2020   A1c=7.7%  I have reviewed outside records, and summarized: Pt was noted to have elevated A1c, and referred here.  Dyslipidemia and HTN were also addressed.       Assessment & Plan:  Type 2 DM: uncontrolled  Patient Instructions  good diet and exercise significantly improve the control of your diabetes.  please let me know if you wish to be referred to a dietician.  high blood sugar is very risky to your health.  you should see an eye doctor and dentist every year.  It is very important to get all recommended vaccinations.  Controlling your blood pressure and cholesterol drastically reduces the damage diabetes does to your body.  Those who smoke should quit.  Please discuss these with your doctor.  Here is a new meter.  I have sent a prescription to your pharmacy, for strips.  check your blood sugar here is a meter a day.  vary the time of day when you check, between before the 3 meals, and at bedtime.  also check if you have symptoms of your blood sugar being too high or too low.  please keep a record of the readings and bring it to your next appointment here (or you can bring the meter itself).  You can write it on any piece of paper.  please call us sooner if your blood  sugar goes below 70, or if most of your readings are over 200. I have sent a prescription to your pharmacy, to change the alogliptin to repaglinide. Please come back for a follow-up appointment in 3 months.

## 2020-09-20 DIAGNOSIS — E278 Other specified disorders of adrenal gland: Secondary | ICD-10-CM | POA: Insufficient documentation

## 2020-09-21 ENCOUNTER — Ambulatory Visit (INDEPENDENT_AMBULATORY_CARE_PROVIDER_SITE_OTHER): Payer: Medicare Other

## 2020-09-21 ENCOUNTER — Encounter: Payer: Self-pay | Admitting: Family Medicine

## 2020-09-21 DIAGNOSIS — I10 Essential (primary) hypertension: Secondary | ICD-10-CM

## 2020-09-21 DIAGNOSIS — E7800 Pure hypercholesterolemia, unspecified: Secondary | ICD-10-CM

## 2020-09-21 DIAGNOSIS — E119 Type 2 diabetes mellitus without complications: Secondary | ICD-10-CM

## 2020-09-21 NOTE — Chronic Care Management (AMB) (Signed)
Chronic Care Management   CCM RN Visit Note  09/22/2020 Name: Alejandra Hess MRN: [id number] DOB: [birth year]  Subjective: Alejandra Hess is a 65 y.o. year old female who is a primary care patient of Alejandra Maw, MD. The care management team was consulted for assistance with disease management and care coordination needs.    Engaged with patient by telephone for initial visit in response to provider referral for case management and/or care coordination services.   Consent to Services:  The patient was given the following information about Chronic Care Management services today, agreed to services, and gave verbal consent: 1. CCM service includes personalized support from designated clinical staff supervised by the primary care provider, including individualized plan of care and coordination with other care providers 2. 24/7 contact phone numbers for assistance for urgent and routine care needs. 3. Service will only be billed when office clinical staff spend 20 minutes or more in a month to coordinate care. 4. Only one practitioner may furnish and bill the service in a calendar month. 5.The patient may stop CCM services at any time (effective at the end of the month) by phone call to the office staff. 6. The patient will be responsible for cost sharing (co-pay) of up to 20% of the service fee (after annual deductible is met). Patient agreed to services and consent obtained.  Patient agreed to services and verbal consent obtained.   Assessment: Review of patient past medical history, allergies, medications, health status, including review of consultants reports, laboratory and other test data, was performed as part of comprehensive evaluation and provision of chronic care management services.   SDOH (Social Determinants of Health) assessments and interventions performed:  SDOH Interventions    Flowsheet Row Most Recent Value  SDOH Interventions   Food Insecurity Interventions  Intervention Not Indicated  Housing Interventions Intervention Not Indicated  Transportation Interventions Intervention Not Indicated        CCM Care Plan  Allergies  Allergen Reactions   Azithromycin Rash   Penicillins Hives and Rash    Outpatient Encounter Medications as of 09/21/2020  Medication Sig Note   albuterol (PROVENTIL) (2.5 MG/3ML) 0.083% nebulizer solution PRN    albuterol (VENTOLIN HFA) 108 (90 Base) MCG/ACT inhaler Inhale into the lungs. PRN    budesonide-formoterol (SYMBICORT) 160-4.5 MCG/ACT inhaler Inhale 1 puff into the lungs at bedtime as needed.    cetirizine (ZYRTEC) 10 MG tablet Take 10 mg by mouth daily.    Fluocinolone Acetonide 0.01 % OIL Place 4 drops into both ears daily as needed. (Patient not taking: Reported on 09/22/2020)    magnesium oxide (MAG-OX) 400 MG tablet Take 400 mg by mouth daily.     Multiple Vitamin (MULTIVITAMIN) tablet Take 1 tablet by mouth daily.    olmesartan (BENICAR) 5 MG tablet Take 1 tablet (5 mg total) by mouth daily.    Probiotic Product (PROBIOTIC PO) Take 1 capsule by mouth 2 (two) times daily.  09/21/2020: Patient states she takes 1 per day.    repaglinide (PRANDIN) 0.5 MG tablet Take 1 tablet (0.5 mg total) by mouth 2 (two) times daily before a meal.    rosuvastatin (CRESTOR) 20 MG tablet Take 1 tablet (20 mg total) by mouth daily.    triamcinolone (KENALOG) 0.1 % paste Apply a thin coat to sores twice daily. 09/22/2020: prn   triamcinolone (NASACORT) 55 MCG/ACT AERO nasal inhaler Place 1 spray into the nose daily. 09/21/2020: Patient states she uses as needed.    valACYclovir (VALTREX)  1000 MG tablet Take 1 tablet (1,000 mg total) by mouth 3 (three) times daily. 09/21/2020: As needed   venlafaxine (EFFEXOR) 50 MG tablet Take 1 tablet (50 mg total) by mouth daily. 09/21/2020: Patient states she takes 50 mg  1 tablet daily   [DISCONTINUED] ondansetron (ZOFRAN) 4 MG tablet Take 1 tablet (4 mg total) by mouth every 8 (eight) hours as  needed for nausea or vomiting. (Patient not taking: Reported on 09/22/2020)    glucose blood (ONETOUCH VERIO) test strip 1 each by Other route daily. And lancets 1/day    No facility-administered encounter medications on file as of 09/21/2020.    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   Chronic fatigue 01/20/2019   Hot flashes 01/20/2019   COVID-19 virus infection 12/12/2018   Viral syndrome 12/08/2018   Reactive airway disease 12/08/2018   Glaucoma 11/24/2018   Gastroesophageal reflux disease 11/21/2018   H/O esophagitis 11/21/2018   Elevated LDL cholesterol level 07/04/2018   Controlled type 2 diabetes mellitus without complication, without long-term current use of insulin ([place]) 07/04/2018   Health care maintenance 01/27/2018   Asthmatic bronchitis 08/19/2017    Conditions to be addressed/monitored:HTN and DMII  Care Plan : Diabetes Type 2 (Adult)  Updates made by Alejandra Karvonen, RN since 09/22/2020 12:00 AM     Problem: Glycemic Management (Diabetes, Type 2)   Priority: High     Long-Range Goal: Glycemic Management Optimized   Start Date: 09/21/2020  Expected End Date: 12/26/2020  This Visit's Progress: On track  Priority: High  Note:   Objective:  Lab Results  Component Value Date   HGBA1C 7.7 (A) 09/19/2020   Lab Results  Component Value Date   CREATININE 0.70 07/01/2020   CREATININE 0.73 12/09/2019   CREATININE 0.76 05/26/2019  Current Barriers:  Knowledge Deficits related to long term plan for self management of Diabetes Type 2:  Patient reports history of diabetes since  2020. States previous oral diabetic medication treatments unsuccessful.  New visit with endocrinologist on 09/19/2020 and new medication prescribed. Patient reports obtaining meter/ strips/ lancets this week.  She states she has been advised by the doctor to check her blood sugars prior to meals and at bedtime. Patient reports today's fasting blood sugar was 296 and yesterdays fasting was 359. Patient states she has been sick with COVID for 2 weeks. She reports starting Paxlovid on 09/06/2020 and states her symptoms have not resolved. She has attempted to be seen by her primary care provider without success. She states she will likely go to an urgent care. Patient reports she is waiting to hear back from her provider office on what she should do.   Case Manager Clinical Goal(s):  patient will demonstrate improved adherence to prescribed treatment plan for diabetes self care/management as evidenced by: daily monitoring and recording of CBG  adherence to ADA/ carb modified diet adherence to prescribed medication regimen contacting provider for new or worsened symptoms or questions Interventions:  Collaboration with Alejandra Maw, MD regarding development and update of comprehensive plan of care as evidenced by provider attestation  and co-signature Inter-disciplinary care team collaboration (see longitudinal plan of care) Reviewed medications with patient and discussed importance of medication adherence Discussed plans with patient for ongoing care management follow up and provided patient with direct contact information for care management team Provided patient with written educational materials related to hypo and hyperglycemia and importance of correct treatment Reviewed scheduled/upcoming provider appointments including Patient Goals: - check blood sugar at prescribed times - check blood sugar if I feel it is too high or too low - enter blood sugar readings into daily log - take the blood sugar  log / meter to all doctor visits - Plan to eat low carbohydrate and low salt meals, watch portion sizes and avoid sugar sweetened drinks - keep appointment with eye doctor - Diabetes foot care: check feet daily for cuts, sores or redness, trim toenails straight across,  wash and dry feet carefully every day,  wear comfortable, well-fitting shoes -How to treat low blood sugars (Blood sugar less than 70 mg/dl  Please follow the RULE OF 15 for the treatment of hypoglycemia treatment (When your blood sugars are less than 70 mg/ dl) STEP  1:  Take 15 grams of carbohydrates when your blood sugar is low, which includes:   3-4 glucose tabs or  3-4 oz of juice or regular soda or  One tube of glucose gel STEP 2:  Recheck blood sugar in 15 minutes STEP 3:  If your blood sugar is still low at the 15 minute recheck ---then, go back to STEP 1 and treat again with another 15 grams of carbohydrates Follow Up Plan: The patient has been provided with contact information for the care management team and has been advised to call with any health related questions or concerns.  The care management team will reach out to the patient again over the next 45 days.         Care Plan : Hypertension (Adult)  Updates made by Alejandra Karvonen, RN since 09/22/2020 12:00 AM     Problem: Hypertension (Hypertension)   Priority: High     Long-Range Goal: Hypertension Monitored   Start Date: 09/21/2020  Expected End Date: 12/26/2020  This Visit's Progress: On track  Priority: High  Note:   Objective:  Last practice recorded BP readings:  BP Readings from Last 3 Encounters:  09/19/20 (!) 160/74  07/01/20 (!) 154/78  02/24/20 (!) 148/82  Current Barriers:  Knowledge Deficits related to long term care plan for self management of hypertension and hyperlipidemia Case Manager Clinical Goal(s):  patient will attend all scheduled medical appointments patient will demonstrate improved adherence to prescribed treatment  plan for hypertension as evidenced by taking all medications as prescribed, monitoring and recording blood pressure as directed, adhering to low sodium/DASH diet patient will verbalize basic understanding of hypertension / Hyperlipidemia disease process and self health management plan Patient will monitor blood pressure at least 1-2 times per week and record.   Interventions:  Collaboration with Alejandra Maw, MD regarding development and update of comprehensive plan of care as evidenced by provider attestation and co-signature Inter-disciplinary care team collaboration (see longitudinal plan of care) Evaluation of current treatment plan related to hypertension self management and patient's adherence to plan as established by provider. Reviewed medications with patient and discussed importance of compliance Discussed plans with patient for ongoing care management follow up and provided patient with direct contact information for care management team Advised patient, providing education and rationale, to monitor blood pressure daily and record,  calling PCP for findings outside established parameters.  Reviewed scheduled/upcoming provider appointments  Patient sent education article in [place] on hypertension and hyperlipidemia Patient Goals: - check blood pressure 1-2 times per week and write blood pressure results in a log or diary - Plan to eat low salt heart healthy meals full of fruits vegetables, whole grains, lean protein, and limit fat and sugars.  - Review education article on Managing hypertension and hyperlipidemia sent to you in Mychart.  - Increase activity as tolerated Follow Up Plan: The patient has been provided with contact information for the care management team and has been advised to call with any health related questions or concerns.  The care management team will reach out to the patient again over the next 45 days.        Plan:The patient has been provided with  contact information for the care management team and has been advised to call with any health related questions or concerns.  and The care management team will reach out to the patient again over the next 45 days. Quinn Plowman RN,BSN,CCM RN Case Manager [place] [phone]

## 2020-09-21 NOTE — Patient Instructions (Addendum)
Visit Information:  Thank you for taking the time to speak with me today.    PATIENT GOALS:   Goals Addressed             This Visit's Progress    Monitor and Manage My Blood Sugar-Diabetes Type 2   On track    Timeframe:  Long-Range Goal Priority:  High Start Date:   09/21/2020                         Expected End Date:  12/26/2020                     Follow Up Date 10/19/2020    - check blood sugar at prescribed times - check blood sugar if I feel it is too high or too low - enter blood sugar readings into daily log - take the blood sugar log / meter to all doctor visits - Plan to eat low carbohydrate and low salt meals, watch portion sizes and avoid sugar sweetened drinks - keep appointment with eye doctor - Diabetes foot care: check feet daily for cuts, sores or redness, trim toenails straight across,  wash and dry feet carefully every day,  wear comfortable, well-fitting shoes -How to treat low blood sugars (Blood sugar less than 70 mg/dl  Please follow the RULE OF 15 for the treatment of hypoglycemia treatment (When your blood sugars are less than 70 mg/ dl) STEP  1:  Take 15 grams of carbohydrates when your blood sugar is low, which includes:   3-4 glucose tabs or  3-4 oz of juice or regular soda or  One tube of glucose gel STEP 2:  Recheck blood sugar in 15 minutes STEP 3:  If your blood sugar is still low at the 15 minute recheck ---then, go back to STEP 1 and treat again with another 15 grams of carbohydrates   Why is this important?   Checking your blood sugar at home helps to keep it from getting very high or very low.  Writing the results in a diary or log helps the doctor know how to care for you.  Your blood sugar log should have the time, date and the results.  Also, write down the amount of insulin or other medicine that you take.  Other information, like what you ate, exercise done and how you were feeling, will also be helpful.          Track and Manage My  Blood Pressure-Hypertension   On track    Timeframe:  Long-Range Goal Priority:  Medium Start Date:     09/21/2020                        Expected End Date: 12/26/2020                      Follow Up Date 10/19/2020   - check blood pressure 1-2 times per week and write blood pressure results in a log or diary - Plan to eat low salt heart healthy meals full of fruits vegetables, whole grains, lean protein, and limit fat and sugars.  - Review education article on Managing hypertension and hyperlipidemia sent to you in Mychart.  - Increase activity as tolerated    Why is this important?   You won't feel high blood pressure, but it can still hurt your blood vessels.  High blood pressure can cause heart  or kidney problems. It can also cause a stroke.  Making lifestyle changes like losing a little weight or eating less salt will help.  Checking your blood pressure at home and at different times of the day can help to control blood pressure.  If the doctor prescribes medicine remember to take it the way the doctor ordered.  Call the office if you cannot afford the medicine or if there are questions about it.              Consent to CCM Services: Ms. Wauneka was given information about Chronic Care Management services today including:  CCM service includes personalized support from designated clinical staff supervised by her physician, including individualized plan of care and coordination with other care providers 24/7 contact phone numbers for assistance for urgent and routine care needs. Service will only be billed when office clinical staff spend 20 minutes or more in a month to coordinate care. Only one practitioner may furnish and bill the service in a calendar month. The patient may stop CCM services at any time (effective at the end of the month) by phone call to the office staff. The patient will be responsible for cost sharing (co-pay) of up to 20% of the service fee (after annual  deductible is met).  Patient agreed to services and verbal consent obtained.   Patient verbalizes understanding of instructions provided today and agrees to view in [place].   The patient has been provided with contact information for the care management team and has been advised to call with any health related questions or concerns.  The care management team will reach out to the patient again over the next 45 days.   Quinn Plowman RN,BSN,CCM RN Case Manager [place] [phone]   CLINICAL CARE PLAN: Patient Care Plan: Diabetes Type 2 (Adult)     Problem Identified: Glycemic Management (Diabetes, Type 2)   Priority: High     Long-Range Goal: Glycemic Management Optimized   Start Date: 09/21/2020  Expected End Date: 12/26/2020  This Visit's Progress: On track  Priority: High  Note:   Objective:  Lab Results  Component Value Date   HGBA1C 7.7 (A) 09/19/2020   Lab Results  Component Value Date   CREATININE 0.70 07/01/2020   CREATININE 0.73 12/09/2019   CREATININE 0.76 05/26/2019  Current Barriers:  Knowledge Deficits related to long term plan for self management of Diabetes Type 2:  Patient reports history of diabetes since 2020. States previous oral diabetic medication treatments unsuccessful.  New visit with endocrinologist on 09/19/2020 and new medication prescribed. Patient reports obtaining meter/ strips/ lancets this week.  She states she has been advised by the doctor to check her blood sugars prior to meals and at bedtime. Patient reports today's fasting blood sugar was 296 and yesterdays fasting was 359. Patient states she has been sick with COVID for 2 weeks. She reports starting Paxlovid on 09/06/2020 and states her symptoms have not resolved. She has attempted to be seen by her primary care provider without success. She states she will likely go to an urgent care. Patient reports she is waiting to hear back from her provider office on what she should  do.   Case Manager Clinical Goal(s):  patient will demonstrate improved adherence to prescribed treatment plan for diabetes self care/management as evidenced by: daily monitoring and recording of CBG  adherence to ADA/ carb modified diet adherence to prescribed medication regimen contacting provider for new or worsened symptoms or questions Interventions:  Collaboration with [name], MD regarding development and update of comprehensive plan of care as evidenced by provider attestation and co-signature Inter-disciplinary care team collaboration (see longitudinal plan of care) Reviewed medications with patient and discussed importance of medication adherence Discussed plans with patient for ongoing care management follow up and provided patient with direct contact information for care management team Provided patient with written educational materials related to hypo and hyperglycemia and importance of correct treatment Reviewed scheduled/upcoming provider appointments including Patient Goals: - check blood sugar at prescribed times - check blood sugar if I feel it is too high or too low - enter blood sugar readings into daily log - take the blood sugar log / meter to all doctor visits - Plan to eat low carbohydrate and low salt meals, watch portion sizes and avoid sugar sweetened drinks - keep appointment with eye doctor - Diabetes foot care: check feet daily for cuts, sores or redness, trim toenails straight across,  wash and dry feet carefully every day,  wear comfortable, well-fitting shoes -How to treat low blood sugars (Blood sugar less than 70 mg/dl  Please follow the RULE OF 15 for the treatment of hypoglycemia treatment (When your blood sugars are less than 70 mg/ dl) STEP  1:  Take 15 grams of carbohydrates when your blood sugar is low, which includes:   3-4 glucose tabs or  3-4 oz of juice or regular soda or  One tube of glucose gel STEP 2:  Recheck blood sugar in 15  minutes STEP 3:  If your blood sugar is still low at the 15 minute recheck ---then, go back to STEP 1 and treat again with another 15 grams of carbohydrates Follow Up Plan: The patient has been provided with contact information for the care management team and has been advised to call with any health related questions or concerns.  The care management team will reach out to the patient again over the next 45 days.         Patient Care Plan: Hypertension (Adult)     Problem Identified: Hypertension (Hypertension)   Priority: High     Long-Range Goal: Hypertension Monitored   Start Date: 09/21/2020  Expected End Date: 12/26/2020  This Visit's Progress: On track  Priority: High  Note:   Objective:  Last practice recorded BP readings:  BP Readings from Last 3 Encounters:  09/19/20 (!) 160/74  07/01/20 (!) 154/78  02/24/20 (!) 148/82  Current Barriers:  Knowledge Deficits related to long term care plan for self management of hypertension and hyperlipidemia Case Manager Clinical Goal(s):  patient will attend all scheduled medical appointments patient will demonstrate improved adherence to prescribed treatment plan for hypertension as evidenced by taking all medications as prescribed, monitoring and recording blood pressure as directed, adhering to low sodium/DASH diet patient will verbalize basic understanding of hypertension / Hyperlipidemia disease process and self health management plan Patient will monitor blood pressure at least 1-2 times per week and record.   Interventions:  Collaboration with [name], MD regarding development and update of comprehensive plan of care as evidenced by provider attestation and co-signature Inter-disciplinary care team collaboration (see longitudinal plan of care) Evaluation of current treatment plan related to hypertension self management and patient's adherence to plan as established by provider. Reviewed medications with patient  and discussed importance of compliance Discussed plans with patient for ongoing care management follow up and provided patient with direct contact information for care management team Advised patient, providing education  and rationale, to monitor blood pressure daily and record, calling PCP for findings outside established parameters.  Reviewed scheduled/upcoming provider appointments  Patient sent education article in [place] on hypertension and hyperlipidemia Patient Goals: - check blood pressure 1-2 times per week and write blood pressure results in a log or diary - Plan to eat low salt heart healthy meals full of fruits vegetables, whole grains, lean protein, and limit fat and sugars.  - Review education article on Managing hypertension and hyperlipidemia sent to you in Mychart.  - Increase activity as tolerated Follow Up Plan: The patient has been provided with contact information for the care management team and has been advised to call with any health related questions or concerns.  The care management team will reach out to the patient again over the next 45 days.

## 2020-09-21 NOTE — Telephone Encounter (Signed)
Unfortunately we didn't have anything at our office today, I offered to try and find out if someone at another office has something available tomorrow but she said she cant wait until tomorrow. Patient requested call back from [facility] asap. Call back [phone]

## 2020-09-22 ENCOUNTER — Telehealth (INDEPENDENT_AMBULATORY_CARE_PROVIDER_SITE_OTHER): Payer: Medicare Other | Admitting: Family Medicine

## 2020-09-22 ENCOUNTER — Other Ambulatory Visit: Payer: Self-pay

## 2020-09-22 ENCOUNTER — Encounter: Payer: Self-pay | Admitting: Family Medicine

## 2020-09-22 VITALS — Temp 99.0°F | Ht 64.0 in | Wt 154.0 lb

## 2020-09-22 DIAGNOSIS — L920 Granuloma annulare: Secondary | ICD-10-CM | POA: Diagnosis not present

## 2020-09-22 DIAGNOSIS — J0100 Acute maxillary sinusitis, unspecified: Secondary | ICD-10-CM

## 2020-09-22 DIAGNOSIS — D485 Neoplasm of uncertain behavior of skin: Secondary | ICD-10-CM | POA: Diagnosis not present

## 2020-09-22 DIAGNOSIS — J309 Allergic rhinitis, unspecified: Secondary | ICD-10-CM | POA: Diagnosis not present

## 2020-09-22 DIAGNOSIS — J4521 Mild intermittent asthma with (acute) exacerbation: Secondary | ICD-10-CM | POA: Diagnosis not present

## 2020-09-22 DIAGNOSIS — L821 Other seborrheic keratosis: Secondary | ICD-10-CM | POA: Diagnosis not present

## 2020-09-22 MED ORDER — DOXYCYCLINE HYCLATE 100 MG PO TABS: 100.0000 mg | ORAL_TABLET | Freq: Two times a day (BID) | ORAL | 0 refills | Status: AC

## 2020-09-22 MED ORDER — PREDNISONE 20 MG PO TABS: 20.0000 mg | ORAL_TABLET | Freq: Two times a day (BID) | ORAL | 0 refills | Status: AC

## 2020-09-22 NOTE — Telephone Encounter (Signed)
Drue Dun handled this

## 2020-09-22 NOTE — Progress Notes (Signed)
Established Patient Office Visit  Subjective:  Patient ID: Alejandra Hess, female    DOB: 01/08/1956  Age: 65 y.o. MRN: [id number]  CC:  Chief Complaint  Patient presents with   Sinusitis    C/O headaches, sore throat, lots of cough sinus drainage and pressure all symptoms since positive for covid 2 weeks ago and no improvement.     HPI Alejandra Hess presents for follow up Covid infection of her COVID infection diagnosed 2 weeks ago.  He was treated with Paxil over.  She has since tested negative for COVID.  She now reports ongoing facial pressure in her forehead and beneath her cheeks.  There is no teeth pain.  There is clear rhinorrhea and postnasal drip there is nonproductive cough with some wheezing.  She is running low-grade temperatures at 99.  She has been using her Zyrtec, Nasacort and her Symbicort as well.  She has developed hives with penicillin and a rash with azithromycin.  She has tolerated doxycycline in the past  Past Medical History:  Diagnosis Date   Allergy    Asthma    DM (diabetes mellitus) (HCC)    GERD (gastroesophageal reflux disease)    Heart murmur    as a child   HLD (hyperlipidemia)    Hypertension     Past Surgical History:  Procedure Laterality Date   27 HOUR [place] STUDY N/A 11/18/2019   Procedure: 24 HOUR PH STUDY;  Surgeon: Irving Copas., MD;  Location: [facility];  Service: Gastroenterology;  Laterality: N/A;   ABDOMINAL HYSTERECTOMY     AUGMENTATION MAMMAPLASTY Bilateral    @ 43 lift with implants   BREAST EXCISIONAL BIOPSY Right    @ 53   BREAST EXCISIONAL BIOPSY Left    '@43'$ ?   CHOLECYSTECTOMY     COLONOSCOPY     ESOPHAGEAL MANOMETRY N/A 11/18/2019   Procedure: ESOPHAGEAL MANOMETRY (EM);  Surgeon: Irving Copas., MD;  Location: [facility];  Service: Gastroenterology;  Laterality: N/A;   UPPER GASTROINTESTINAL ENDOSCOPY      Family History  Problem Relation Age of Onset   Hearing loss Mother    Hyperlipidemia  Mother    Hypertension Mother    Diabetes Mother    Heart attack Father    Heart disease Father    Hyperlipidemia Father    Hypertension Father    Melanoma Father    Asthma Sister    Hyperlipidemia Sister    Hypertension Sister    Hyperlipidemia Sister    Breast cancer Sister 22   Asthma Sister    Depression Sister    Heart attack Sister    Heart disease Sister    Hypertension Sister    Diabetes Sister    Breast cancer Maternal Aunt        over 67   Breast cancer Paternal Aunt        over 44   Breast cancer Paternal Grandmother        over 23    Breast cancer Maternal Aunt        over 22    Colon cancer Maternal Grandfather    Esophageal cancer Neg Hx    Inflammatory bowel disease Neg Hx    Liver disease Neg Hx    Pancreatic cancer Neg Hx    Rectal cancer Neg Hx    Stomach cancer Neg Hx     Social History   Socioeconomic History   Marital status: Married    Spouse name: Not on file  Number of children: 2   Years of education: Not on file   Highest education level: Not on file  Occupational History   Occupation: RN  Tobacco Use   Smoking status: Former    Types: Cigarettes    Quit date: 2013    Years since quitting: 9.5   Smokeless tobacco: Never  Vaping Use   Vaping Use: Never used  Substance and Sexual Activity   Alcohol use: Yes    Comment: 2 glasses of red wine daily   Drug use: Never   Sexual activity: Yes    Partners: Male    Comment: 1st intercourse- 48, partners- 5-, MARRIED- 69  Other Topics Concern   Not on file  Social History Narrative   Not on file   Social Determinants of Health   Financial Resource Strain: Low Risk    Difficulty of Paying Living Expenses: Not hard at all  Food Insecurity: No Food Insecurity   Worried About Charity fundraiser in the Last Year: Never true   [place] in the Last Year: Never true  Transportation Needs: No Transportation Needs   Lack of Transportation (Medical): No   Lack of Transportation  (Non-Medical): No  Physical Activity: Insufficiently Active   Days of Exercise per Week: 3 days   Minutes of Exercise per Session: 30 min  Stress: No Stress Concern Present   Feeling of Stress : Not at all  Social Connections: Moderately Isolated   Frequency of Communication with Friends and Family: More than three times a week   Frequency of Social Gatherings with Friends and Family: Once a week   Attends Religious Services: Never   Marine scientist or Organizations: No   Attends Music therapist: Never   Marital Status: Married  Human resources officer Violence: Not At Risk   Fear of Current or Ex-Partner: No   Emotionally Abused: No   Physically Abused: No   Sexually Abused: No    Outpatient Medications Prior to Visit  Medication Sig Dispense Refill   albuterol (PROVENTIL) (2.5 MG/3ML) 0.083% nebulizer solution PRN  7   albuterol (VENTOLIN HFA) 108 (90 Base) MCG/ACT inhaler Inhale into the lungs. PRN     budesonide-formoterol (SYMBICORT) 160-4.5 MCG/ACT inhaler Inhale 1 puff into the lungs at bedtime as needed. 1 Inhaler 3   cetirizine (ZYRTEC) 10 MG tablet Take 10 mg by mouth daily.     glucose blood (ONETOUCH VERIO) test strip 1 each by Other route daily. And lancets 1/day 100 each 12   magnesium oxide (MAG-OX) 400 MG tablet Take 400 mg by mouth daily.      Multiple Vitamin (MULTIVITAMIN) tablet Take 1 tablet by mouth daily.     olmesartan (BENICAR) 5 MG tablet Take 1 tablet (5 mg total) by mouth daily. 90 tablet 1   Probiotic Product (PROBIOTIC PO) Take 1 capsule by mouth 2 (two) times daily.      repaglinide (PRANDIN) 0.5 MG tablet Take 1 tablet (0.5 mg total) by mouth 2 (two) times daily before a meal. 60 tablet 11   rosuvastatin (CRESTOR) 20 MG tablet Take 1 tablet (20 mg total) by mouth daily. 90 tablet 3   triamcinolone (KENALOG) 0.1 % paste Apply a thin coat to sores twice daily. 5 g 1   triamcinolone (NASACORT) 55 MCG/ACT AERO nasal inhaler Place 1 spray  into the nose daily.     valACYclovir (VALTREX) 1000 MG tablet Take 1 tablet (1,000 mg total) by mouth 3 (  three) times daily. 21 tablet 0   venlafaxine (EFFEXOR) 50 MG tablet Take 1 tablet (50 mg total) by mouth daily. 90 tablet 0   Fluocinolone Acetonide 0.01 % OIL Place 4 drops into both ears daily as needed. (Patient not taking: Reported on 09/22/2020) 20 mL 2   pantoprazole (PROTONIX) 40 MG tablet Take 40 mg by mouth daily.     ondansetron (ZOFRAN) 4 MG tablet Take 1 tablet (4 mg total) by mouth every 8 (eight) hours as needed for nausea or vomiting. (Patient not taking: Reported on 09/22/2020) 20 tablet 0   No facility-administered medications prior to visit.    Allergies  Allergen Reactions   Azithromycin Rash   Penicillins Hives and Rash    ROS Review of Systems  Constitutional:  Positive for fatigue. Negative for diaphoresis, fever and unexpected weight change.  HENT:  Positive for congestion, postnasal drip, rhinorrhea, sinus pressure, sinus pain and sore throat.   Respiratory:  Positive for cough and wheezing. Negative for shortness of breath.   Cardiovascular: Negative.   Gastrointestinal: Negative.   Neurological:  Positive for headaches. Negative for weakness.     Objective:    Physical Exam Vitals and nursing note reviewed.  Constitutional:      General: She is not in acute distress.    Appearance: Normal appearance. She is not ill-appearing, toxic-appearing or diaphoretic.  HENT:     Head: Normocephalic and atraumatic.  Eyes:     General: No scleral icterus.       Right eye: No discharge.        Left eye: No discharge.     Conjunctiva/sclera: Conjunctivae normal.  Pulmonary:     Effort: Pulmonary effort is normal.  Neurological:     Mental Status: She is alert and oriented to person, place, and time.  Psychiatric:        Mood and Affect: Mood normal.        Behavior: Behavior normal.    Temp 99 F (37.2 C) (Oral)   Ht '5\' 4"'$  (1.626 m)   Wt 154 lb (69.9  kg)   BMI 26.43 kg/m  Wt Readings from Last 3 Encounters:  09/22/20 154 lb (69.9 kg)  09/19/20 166 lb 6.4 oz (75.5 kg)  09/06/20 160 lb (72.6 kg)     Health Maintenance Due  Topic Date Due   TETANUS/TDAP  Never done   Zoster Vaccines- Shingrix (1 of 2) Never done   OPHTHALMOLOGY EXAM  11/17/2019    There are no preventive care reminders to display for this patient.  Lab Results  Component Value Date   TSH 1.53 01/20/2019   Lab Results  Component Value Date   WBC 6.1 07/01/2020   HGB 14.0 07/01/2020   HCT 41.3 07/01/2020   MCV 90.9 07/01/2020   PLT 278.0 07/01/2020   Lab Results  Component Value Date   NA 139 07/01/2020   K 3.9 07/01/2020   CO2 27 07/01/2020   GLUCOSE 159 (H) 07/01/2020   BUN 12 07/01/2020   CREATININE 0.70 07/01/2020   BILITOT 0.5 07/01/2020   ALKPHOS 75 07/01/2020   AST 28 07/01/2020   ALT 50 (H) 07/01/2020   PROT 7.3 07/01/2020   ALBUMIN 4.6 07/01/2020   CALCIUM 9.8 07/01/2020   GFR 89.80 07/01/2020   Lab Results  Component Value Date   CHOL 164 12/09/2019   Lab Results  Component Value Date   HDL 57.10 12/09/2019   Lab Results  Component Value Date  [place] 90 12/09/2019   Lab Results  Component Value Date   TRIG 87.0 12/09/2019   Lab Results  Component Value Date   CHOLHDL 3 12/09/2019   Lab Results  Component Value Date   HGBA1C 7.7 (A) 09/19/2020      Assessment & Plan:   Problem List Items Addressed This Visit       Respiratory   Reactive airway disease   Relevant Orders   Ambulatory referral to Allergy   Acute non-recurrent maxillary sinusitis - Primary   Relevant Medications   predniSONE (DELTASONE) 20 MG tablet   doxycycline (VIBRA-TABS) 100 MG tablet   Other Visit Diagnoses     Allergic rhinitis, unspecified seasonality, unspecified trigger       Relevant Orders   Ambulatory referral to Allergy       Meds ordered this encounter  Medications   predniSONE (DELTASONE) 20 MG tablet    Sig:  Take 1 tablet (20 mg total) by mouth 2 (two) times daily with a meal for 7 days.    Dispense:  14 tablet    Refill:  0   doxycycline (VIBRA-TABS) 100 MG tablet    Sig: Take 1 tablet (100 mg total) by mouth 2 (two) times daily for 10 days.    Dispense:  20 tablet    Refill:  0    Follow-up: No follow-ups on file.  Other than the close association with a recent viral infection, or persisting symptoms and low-grade temperature I am not certain that this is a sinus infection.  We will treat as such with doxycycline that she tolerates.  Agrees to referral for allergist consultation.  Virtual Visit via Video Note  I connected with Alejandra Hess on 09/22/20 at  9:00 AM EDT by a video enabled telemedicine application and verified that I am speaking with the correct person using two identifiers.  Location: Patient: home with husband.  Provider: work   I discussed the limitations of evaluation and management by telemedicine and the availability of in person appointments. The patient expressed understanding and agreed to proceed.  History of Present Illness:    Observations/Objective:   Assessment and Plan:   Follow Up Instructions:    I discussed the assessment and treatment plan with the patient. The patient was provided an opportunity to ask questions and all were answered. The patient agreed with the plan and demonstrated an understanding of the instructions.   The patient was advised to call back or seek an in-person evaluation if the symptoms worsen or if the condition fails to improve as anticipated.  I provided 25 minutes of non-face-to-face time during this encounter.   [name], MD   [name], MD

## 2020-09-23 ENCOUNTER — Other Ambulatory Visit: Payer: Self-pay

## 2020-09-23 ENCOUNTER — Telehealth: Payer: Self-pay | Admitting: Endocrinology

## 2020-09-23 DIAGNOSIS — E119 Type 2 diabetes mellitus without complications: Secondary | ICD-10-CM

## 2020-09-23 MED ORDER — ONETOUCH VERIO VI STRP: 1.0000 | ORAL_STRIP | Freq: Every day | 12 refills | Status: DC

## 2020-09-23 MED ORDER — ONETOUCH ULTRASOFT LANCETS MISC
12 refills | Status: DC
Start: 2020-09-23 — End: 2021-02-24

## 2020-09-23 NOTE — Telephone Encounter (Signed)
Pt had appt on 7/25 and says we were sending in the below medication but when she got there it was not there.  MEDICATION: onetouch test strips and lancets  PHARMACY:   [facility] [id number] - [address], [address] AT [address] Phone:  [phone]  Fax:  [phone]      HAS THE PATIENT CONTACTED THEIR PHARMACY?  Yes  IS THIS A 90 DAY SUPPLY : pharmacist didn't know  IS PATIENT OUT OF MEDICATION: yes  IF NOT; HOW MUCH IS LEFT:   LAST APPOINTMENT DATE: '@7'$ /25/2022  NEXT APPOINTMENT DATE:'@Visit'$  date not found  DO WE HAVE YOUR PERMISSION TO LEAVE A DETAILED MESSAGE?: yes   **Let patient know to contact pharmacy at the end of the day to make sure medication is ready. **  ** Please notify patient to allow 48-72 hours to process**  **Encourage patient to contact the pharmacy for refills or they can request refills through [facility]**

## 2020-10-10 ENCOUNTER — Other Ambulatory Visit: Payer: Self-pay | Admitting: Family Medicine

## 2020-10-10 ENCOUNTER — Other Ambulatory Visit: Payer: Self-pay | Admitting: Gastroenterology

## 2020-10-10 DIAGNOSIS — U071 COVID-19: Secondary | ICD-10-CM

## 2020-10-11 MED ORDER — VENLAFAXINE HCL 50 MG PO TABS: 50.0000 mg | ORAL_TABLET | Freq: Every day | ORAL | 0 refills | Status: DC

## 2020-10-12 ENCOUNTER — Ambulatory Visit (INDEPENDENT_AMBULATORY_CARE_PROVIDER_SITE_OTHER): Payer: Medicare Other

## 2020-10-12 DIAGNOSIS — E119 Type 2 diabetes mellitus without complications: Secondary | ICD-10-CM

## 2020-10-12 DIAGNOSIS — I10 Essential (primary) hypertension: Secondary | ICD-10-CM

## 2020-10-12 DIAGNOSIS — E7800 Pure hypercholesterolemia, unspecified: Secondary | ICD-10-CM

## 2020-10-12 NOTE — Patient Instructions (Signed)
Visit Information:  Thank you for speaking with me today.  PATIENT GOALS:  Goals Addressed             This Visit's Progress    Monitor and Manage My Blood Sugar-Diabetes Type 2   On track    Timeframe:  Long-Range Goal Priority:  High Start Date:   09/21/2020                         Expected End Date:  01/25/2021                   Follow Up Date 11/30/2020   - continue to check blood sugar at prescribed times and enter blood sugar readings into daily log - check blood sugar if I feel it is too high or too low - take the blood sugar log / meter to all doctor visits - Follow a low carbohydrate and low salt meals, watch portion sizes and avoid sugar sweetened drinks - keep appointment with eye doctor -How to treat low blood sugars (Blood sugar less than 70 mg/dl  Please follow the RULE OF 15 for the treatment of hypoglycemia treatment (When your blood sugars are less than 70 mg/ dl) STEP  1:  Take 15 grams of carbohydrates when your blood sugar is low, which includes:   3-4 glucose tabs or  3-4 oz of juice or regular soda or  One tube of glucose gel STEP 2:  Recheck blood sugar in 15 minutes STEP 3:  If your blood sugar is still low at the 15 minute recheck ---then, go back to STEP 1 and treat again with another 15 grams of carbohydrates    Why is this important?   Checking your blood sugar at home helps to keep it from getting very high or very low.  Writing the results in a diary or log helps the doctor know how to care for you.  Your blood sugar log should have the time, date and the results.  Also, write down the amount of insulin or other medicine that you take.  Other information, like what you ate, exercise done and how you were feeling, will also be helpful.          Track and Manage My Blood Pressure-Hypertension   On track    Timeframe:  Long-Range Goal Priority:  Medium Start Date:     09/21/2020                        Expected End Date: 01/25/2021                     Follow Up Date  11/30/2020  - Continue to check blood pressure 1-2 times per week and write blood pressure results in a log or diary(report blood pressure readings to your provider) - Eat low salt heart healthy meals full of fruits vegetables, whole grains, lean protein, and limit fat and sugars.  - Increase activity as tolerated - Continue to take your medications as prescribed and refill timely - Notify your doctor for new or ongoing symptoms/ concerns.    Why is this important?   You won't feel high blood pressure, but it can still hurt your blood vessels.  High blood pressure can cause heart or kidney problems. It can also cause a stroke.  Making lifestyle changes like losing a little weight or eating less salt will help.  Checking your  blood pressure at home and at different times of the day can help to control blood pressure.  If the doctor prescribes medicine remember to take it the way the doctor ordered.  Call the office if you cannot afford the medicine or if there are questions about it.              Patient verbalizes understanding of instructions provided today and agrees to view in [place].   The patient has been provided with contact information for the care management team and has been advised to call with any health related questions or concerns.  The care management team will reach out to the patient again over the next 45 days.  Quinn Plowman RN,BSN,CCM RN Case Manager [place] [phone]

## 2020-10-12 NOTE — Chronic Care Management (AMB) (Signed)
Chronic Care Management   CCM RN Visit Note  10/12/2020 Name: Alejandra Hess MRN: [id number] DOB: [birth year]  Subjective: Alejandra Hess is a 65 y.o. year old female who is a primary care patient of [name], MD. The care management team was consulted for assistance with disease management and care coordination needs.    Engaged with patient by telephone for follow up visit in response to provider referral for case management and/or care coordination services.   Consent to Services:  The patient was given information about Chronic Care Management services, agreed to services, and gave verbal consent prior to initiation of services.  Please see initial visit note for detailed documentation.   Patient agreed to services and verbal consent obtained.   Assessment: Review of patient past medical history, allergies, medications, health status, including review of consultants reports, laboratory and other test data, was performed as part of comprehensive evaluation and provision of chronic care management services.   SDOH (Social Determinants of Health) assessments and interventions performed:    CCM Care Plan  Allergies  Allergen Reactions   Azithromycin Rash   Penicillins Hives and Rash    Outpatient Encounter Medications as of 10/12/2020  Medication Sig Note   albuterol (PROVENTIL) (2.5 MG/3ML) 0.083% nebulizer solution PRN    albuterol (VENTOLIN HFA) 108 (90 Base) MCG/ACT inhaler Inhale into the lungs. PRN    budesonide-formoterol (SYMBICORT) 160-4.5 MCG/ACT inhaler Inhale 1 puff into the lungs at bedtime as needed.    cetirizine (ZYRTEC) 10 MG tablet Take 10 mg by mouth daily.    Fluocinolone Acetonide 0.01 % OIL Place 4 drops into both ears daily as needed. (Patient not taking: Reported on 09/22/2020)    glucose blood (ONETOUCH VERIO) test strip 1 each by Other route daily. And lancets 1/day    Lancets (ONETOUCH ULTRASOFT) lancets Use as instructed    magnesium  oxide (MAG-OX) 400 MG tablet Take 400 mg by mouth daily.     Multiple Vitamin (MULTIVITAMIN) tablet Take 1 tablet by mouth daily.    olmesartan (BENICAR) 5 MG tablet Take 1 tablet (5 mg total) by mouth daily.    pantoprazole (PROTONIX) 40 MG tablet TAKE 1 TABLET BY MOUTH DAILY    Probiotic Product (PROBIOTIC PO) Take 1 capsule by mouth 2 (two) times daily.  09/21/2020: Patient states she takes 1 per day.    repaglinide (PRANDIN) 0.5 MG tablet Take 1 tablet (0.5 mg total) by mouth 2 (two) times daily before a meal.    rosuvastatin (CRESTOR) 20 MG tablet Take 1 tablet (20 mg total) by mouth daily.    triamcinolone (KENALOG) 0.1 % paste Apply a thin coat to sores twice daily. 09/22/2020: prn   triamcinolone (NASACORT) 55 MCG/ACT AERO nasal inhaler Place 1 spray into the nose daily. 09/21/2020: Patient states she uses as needed.    valACYclovir (VALTREX) 1000 MG tablet Take 1 tablet (1,000 mg total) by mouth 3 (three) times daily. 09/21/2020: As needed   venlafaxine (EFFEXOR) 50 MG tablet Take 1 tablet (50 mg total) by mouth daily.    No facility-administered encounter medications on file as of 10/12/2020.    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   Chronic fatigue 01/20/2019   Hot flashes 01/20/2019   COVID-19 virus infection 12/12/2018   Viral syndrome 12/08/2018   Reactive airway disease 12/08/2018   Glaucoma 11/24/2018   Gastroesophageal reflux disease 11/21/2018   H/O esophagitis 11/21/2018   Elevated LDL cholesterol level 07/04/2018   Controlled type 2 diabetes mellitus without complication, without  long-term current use of insulin ([place]) 07/04/2018   Health care maintenance 01/27/2018   Asthmatic bronchitis 08/19/2017    Conditions to be addressed/monitored:HTN and DMII  Care Plan : Diabetes Type 2 (Adult)  Updates made by Dannielle Karvonen, RN since 10/12/2020 12:00 AM     Problem: Glycemic Management (Diabetes, Type 2)   Priority: High     Long-Range Goal: Glycemic Management Optimized   Start Date: 09/21/2020  Expected End Date: 01/25/2021  This Visit's Progress: On track  Recent Progress: On track  Priority: High  Note:   Objective:  Lab Results  Component Value Date   HGBA1C 7.7 (A) 09/19/2020   Lab Results  Component Value Date   CREATININE 0.70 07/01/2020   CREATININE 0.73 12/09/2019   CREATININE 0.76 05/26/2019  Current Barriers:  Knowledge Deficits related to long term plan for self management of Diabetes Type 2:  Patient states she is recently getting over [place].  Reports feeling better.  Patient states her blood sugars have ranged between 134 - 246.  She states she sees some improvement since being on the oral diabetic medication. She denies any low blood sugar readings <70.  Patient reports scheduling her eye doctor appointment.  Unable to state the date, she says she has it written on her calendar.  .  Patient reports follow up visit with endocrinologist was 09/19/2020.   Case Manager Clinical Goal(s):  patient will demonstrate improved adherence to prescribed treatment plan for diabetes self care/management as evidenced by: daily monitoring and recording of CBG  adherence to ADA/ carb modified diet adherence to prescribed medication regimen contacting provider for new or worsened symptoms or questions Interventions:  Collaboration with [name], MD regarding development and update of comprehensive plan of care as evidenced by provider attestation and co-signature Inter-disciplinary care team collaboration (see longitudinal plan of care) Reviewed  medications with patient and discussed importance of medication adherence Discussed plans with patient for ongoing care management follow up and provided patient with direct contact information for care management team Provided patient with written educational materials related to hypo and hyperglycemia and importance of correct treatment Reviewed scheduled/upcoming provider appointments including Patient Goals: - continue to check blood sugar at prescribed times and enter blood sugar readings into daily log - check blood sugar if I feel it is too high or too low - take the blood sugar log / meter to all doctor visits - Follow a low carbohydrate and low salt meals, watch portion sizes and avoid sugar sweetened drinks - keep appointment with eye doctor -How to treat low blood sugars (Blood sugar less than 70 mg/dl  Please follow the RULE OF 15 for the treatment of hypoglycemia treatment (When your blood sugars are less than 70 mg/ dl) STEP  1:  Take 15 grams of carbohydrates when your blood sugar is low, which includes:   3-4 glucose tabs or  3-4 oz of juice or regular soda or  One tube of glucose gel STEP 2:  Recheck blood sugar in 15 minutes STEP 3:  If your  blood sugar is still low at the 15 minute recheck ---then, go back to STEP 1 and treat again with another 15 grams of carbohydrates Follow Up Plan: The patient has been provided with contact information for the care management team and has been advised to call with any health related questions or concerns.  The care management team will reach out to the patient again over the next 45 days.         Care Plan : Hypertension (Adult)  Updates made by Dannielle Karvonen, RN since 10/12/2020 12:00 AM     Problem: Hypertension (Hypertension)   Priority: High     Long-Range Goal: Hypertension Monitored   Start Date: 09/21/2020  Expected End Date: 12/26/2020  Recent Progress: On track  Priority: High  Note:   Objective:  Last practice  recorded BP readings:  BP Readings from Last 3 Encounters:  09/19/20 (!) 160/74  07/01/20 (!) 154/78  02/24/20 (!) 148/82  Current Barriers:  Knowledge Deficits related to long term care plan for self management of hypertension and hyperlipidemia:  Patient reports recent blood pressures: 136/83, 153/86, 139/80, 156/95. She states she continues on new blood pressure medication prescribed by her doctor. Patient advised to schedule follow up appointment with primary care provider.  Case Manager Clinical Goal(s):  patient will attend all scheduled medical appointments patient will demonstrate improved adherence to prescribed treatment plan for hypertension as evidenced by taking all medications as prescribed, monitoring and recording blood pressure as directed, adhering to low sodium/DASH diet patient will verbalize basic understanding of hypertension / Hyperlipidemia disease process and self health management plan Patient will monitor blood pressure at least 1-2 times per week and record.   Interventions:  Collaboration with [name], MD regarding development and update of comprehensive plan of care as evidenced by provider attestation and co-signature Inter-disciplinary care team collaboration (see longitudinal plan of care) Evaluation of current treatment plan related to hypertension self management and patient's adherence to plan as established by provider. Reviewed medications with patient and discussed importance of compliance Discussed plans with patient for ongoing care management follow up and provided patient with direct contact information for care management team Advised patient, providing education and rationale, to monitor blood pressure daily and record, calling PCP for findings outside established parameters.  Reviewed scheduled/upcoming provider appointments  Patient sent education article in [place] on hypertension and hyperlipidemia Patient Goals: - Continue to  check blood pressure 1-2 times per week and write blood pressure results in a log or diary(report blood pressure readings to your provider) - Eat low salt heart healthy meals full of fruits vegetables, whole grains, lean protein, and limit fat and sugars.  - Increase activity as tolerated - Continue to take your medications as prescribed and refill timely - Notify your doctor for new or ongoing symptoms/ concerns.  Follow Up Plan: The patient has been provided with contact information for the care management team and has been advised to call with any health related questions or concerns.  The care management team will reach out to the patient again over the next 45 days.        Plan:The patient has been provided with contact information for the care management team and has been advised to call with any health related questions or concerns.  and The care management team will reach out to the patient again over the next 45 days. Quinn Plowman RN,BSN,CCM RN Case Manager [place] [phone]

## 2020-10-19 ENCOUNTER — Telehealth: Payer: Medicare Other

## 2020-10-27 ENCOUNTER — Ambulatory Visit (INDEPENDENT_AMBULATORY_CARE_PROVIDER_SITE_OTHER): Payer: Medicare Other | Admitting: Family Medicine

## 2020-10-27 ENCOUNTER — Other Ambulatory Visit: Payer: Self-pay

## 2020-10-27 ENCOUNTER — Encounter: Payer: Self-pay | Admitting: Family Medicine

## 2020-10-27 VITALS — BP 158/82 | HR 77 | Temp 97.0°F | Ht 64.0 in | Wt 168.6 lb

## 2020-10-27 DIAGNOSIS — Z23 Encounter for immunization: Secondary | ICD-10-CM

## 2020-10-27 DIAGNOSIS — H608X3 Other otitis externa, bilateral: Secondary | ICD-10-CM | POA: Diagnosis not present

## 2020-10-27 DIAGNOSIS — Z0000 Encounter for general adult medical examination without abnormal findings: Secondary | ICD-10-CM | POA: Diagnosis not present

## 2020-10-27 DIAGNOSIS — H6123 Impacted cerumen, bilateral: Secondary | ICD-10-CM | POA: Diagnosis not present

## 2020-10-27 DIAGNOSIS — I10 Essential (primary) hypertension: Secondary | ICD-10-CM

## 2020-10-27 MED ORDER — OLMESARTAN MEDOXOMIL 20 MG PO TABS: 20.0000 mg | ORAL_TABLET | Freq: Every day | ORAL | 0 refills | Status: DC

## 2020-10-27 MED ORDER — PREDNISOLONE ACETATE 1 % OP SUSP: OPHTHALMIC | 1 refills | Status: DC

## 2020-10-27 NOTE — Progress Notes (Addendum)
Established Patient Office Visit  Subjective:  Patient ID: Alejandra Hess, female    DOB: 02/22/1956  Age: 65 y.o. MRN: [id number]  CC:  Chief Complaint  Patient presents with   Follow-up    Follow up on BP and DM, patient states that she have had a ear ache for a couple of days both ears.     HPI Alejandra Hess presents for follow-up of her blood pressure and ear discomfort.  Continues follow-up with endocrinology for diabetes.  Ears do not feel stopped up.  Her ears itch.  She does have a history of ongoing allergy symptoms with postnasal drip nasal congestion sneezing and watery eyes.  She has been followed by an allergist in the past.  She uses Zyrtec alternating with Claritin.  History of ceruminosis.  She has had any ear irrigation in the past.  Brings in a list of blood pressures that are averaging 1 well into the [id number] systolically.  She is on low-dose olmesartan currently for renal protection.  She is having no problems taking it.  Past Medical History:  Diagnosis Date   Allergy    Asthma    DM (diabetes mellitus) (HCC)    GERD (gastroesophageal reflux disease)    Heart murmur    as a child   HLD (hyperlipidemia)    Hypertension     Past Surgical History:  Procedure Laterality Date   7 HOUR [place] STUDY N/A 11/18/2019   Procedure: 24 HOUR PH STUDY;  Surgeon: Irving Copas., MD;  Location: [facility];  Service: Gastroenterology;  Laterality: N/A;   ABDOMINAL HYSTERECTOMY     AUGMENTATION MAMMAPLASTY Bilateral    @ 43 lift with implants   BREAST EXCISIONAL BIOPSY Right    @ 53   BREAST EXCISIONAL BIOPSY Left    '@43'$ ?   CHOLECYSTECTOMY     COLONOSCOPY     ESOPHAGEAL MANOMETRY N/A 11/18/2019   Procedure: ESOPHAGEAL MANOMETRY (EM);  Surgeon: Irving Copas., MD;  Location: [facility];  Service: Gastroenterology;  Laterality: N/A;   UPPER GASTROINTESTINAL ENDOSCOPY      Family History  Problem Relation Age of Onset   Hearing loss Mother     Hyperlipidemia Mother    Hypertension Mother    Diabetes Mother    Heart attack Father    Heart disease Father    Hyperlipidemia Father    Hypertension Father    Melanoma Father    Asthma Sister    Hyperlipidemia Sister    Hypertension Sister    Hyperlipidemia Sister    Breast cancer Sister 44   Asthma Sister    Depression Sister    Heart attack Sister    Heart disease Sister    Hypertension Sister    Diabetes Sister    Breast cancer Maternal Aunt        over 82   Breast cancer Paternal Aunt        over 77   Breast cancer Paternal Grandmother        over 18    Breast cancer Maternal Aunt        over 60    Colon cancer Maternal Grandfather    Esophageal cancer Neg Hx    Inflammatory bowel disease Neg Hx    Liver disease Neg Hx    Pancreatic cancer Neg Hx    Rectal cancer Neg Hx    Stomach cancer Neg Hx     Social History   Socioeconomic History   Marital status: Married  Spouse name: Not on file   Number of children: 2   Years of education: Not on file   Highest education level: Not on file  Occupational History   Occupation: RN  Tobacco Use   Smoking status: Former    Types: Cigarettes    Quit date: 2013    Years since quitting: 9.7   Smokeless tobacco: Never  Vaping Use   Vaping Use: Never used  Substance and Sexual Activity   Alcohol use: Yes    Comment: 2 glasses of red wine daily   Drug use: Never   Sexual activity: Yes    Partners: Male    Comment: 1st intercourse- 50, partners- 5-, MARRIED- 34  Other Topics Concern   Not on file  Social History Narrative   Not on file   Social Determinants of Health   Financial Resource Strain: Low Risk    Difficulty of Paying Living Expenses: Not hard at all  Food Insecurity: No Food Insecurity   Worried About Charity fundraiser in the Last Year: Never true   [place] in the Last Year: Never true  Transportation Needs: No Transportation Needs   Lack of Transportation (Medical): No   Lack of  Transportation (Non-Medical): No  Physical Activity: Insufficiently Active   Days of Exercise per Week: 3 days   Minutes of Exercise per Session: 30 min  Stress: No Stress Concern Present   Feeling of Stress : Not at all  Social Connections: Moderately Isolated   Frequency of Communication with Friends and Family: More than three times a week   Frequency of Social Gatherings with Friends and Family: Once a week   Attends Religious Services: Never   Marine scientist or Organizations: No   Attends Music therapist: Never   Marital Status: Married  Human resources officer Violence: Not At Risk   Fear of Current or Ex-Partner: No   Emotionally Abused: No   Physically Abused: No   Sexually Abused: No    Outpatient Medications Prior to Visit  Medication Sig Dispense Refill   albuterol (PROVENTIL) (2.5 MG/3ML) 0.083% nebulizer solution PRN  7   albuterol (VENTOLIN HFA) 108 (90 Base) MCG/ACT inhaler Inhale into the lungs. PRN     budesonide-formoterol (SYMBICORT) 160-4.5 MCG/ACT inhaler Inhale 1 puff into the lungs at bedtime as needed. 1 Inhaler 3   cetirizine (ZYRTEC) 10 MG tablet Take 10 mg by mouth daily.     glucose blood (ONETOUCH VERIO) test strip 1 each by Other route daily. And lancets 1/day 100 each 12   Lancets (ONETOUCH ULTRASOFT) lancets Use as instructed 100 each 12   magnesium oxide (MAG-OX) 400 MG tablet Take 400 mg by mouth daily.      Multiple Vitamin (MULTIVITAMIN) tablet Take 1 tablet by mouth daily.     pantoprazole (PROTONIX) 40 MG tablet TAKE 1 TABLET BY MOUTH DAILY 90 tablet 1   Probiotic Product (PROBIOTIC PO) Take 1 capsule by mouth 2 (two) times daily.      repaglinide (PRANDIN) 0.5 MG tablet Take 1 tablet (0.5 mg total) by mouth 2 (two) times daily before a meal. 60 tablet 11   rosuvastatin (CRESTOR) 20 MG tablet Take 1 tablet (20 mg total) by mouth daily. 90 tablet 3   triamcinolone (KENALOG) 0.1 % paste Apply a thin coat to sores twice daily. 5 g 1    triamcinolone (NASACORT) 55 MCG/ACT AERO nasal inhaler Place 1 spray into the nose daily.  valACYclovir (VALTREX) 1000 MG tablet Take 1 tablet (1,000 mg total) by mouth 3 (three) times daily. 21 tablet 0   venlafaxine (EFFEXOR) 50 MG tablet Take 1 tablet (50 mg total) by mouth daily. 90 tablet 0   olmesartan (BENICAR) 5 MG tablet Take 1 tablet (5 mg total) by mouth daily. 90 tablet 1   Fluocinolone Acetonide 0.01 % OIL Place 4 drops into both ears daily as needed. (Patient not taking: Reported on 09/22/2020) 20 mL 2   No facility-administered medications prior to visit.    Allergies  Allergen Reactions   Azithromycin Rash   Penicillins Hives and Rash    ROS Review of Systems  Constitutional:  Negative for diaphoresis, fatigue, fever and unexpected weight change.  HENT:  Positive for congestion, postnasal drip and sneezing. Negative for ear discharge, ear pain and hearing loss.   Eyes:  Negative for photophobia and visual disturbance.  Respiratory: Negative.    Cardiovascular: Negative.   Neurological:  Negative for headaches.     Objective:    Physical Exam Vitals and nursing note reviewed.  Constitutional:      General: She is not in acute distress.    Appearance: Normal appearance. She is not ill-appearing, toxic-appearing or diaphoretic.  HENT:     Head: Normocephalic and atraumatic.     Right Ear: A foreign body is present. Tympanic membrane is retracted. Tympanic membrane is not injected, scarred or erythematous.     Left Ear: A foreign body is present. Tympanic membrane is retracted. Tympanic membrane is not injected, scarred or erythematous.     Ears:      Mouth/Throat:     Mouth: Mucous membranes are moist.     Pharynx: Oropharynx is clear. No oropharyngeal exudate or posterior oropharyngeal erythema.  Eyes:     General:        Right eye: No discharge.        Left eye: No discharge.     Extraocular Movements: Extraocular movements intact.      Conjunctiva/sclera: Conjunctivae normal.     Pupils: Pupils are equal, round, and reactive to light.  Neck:     Vascular: No carotid bruit.  Cardiovascular:     Rate and Rhythm: Normal rate and regular rhythm.  Pulmonary:     Effort: Pulmonary effort is normal.     Breath sounds: Normal breath sounds.  Musculoskeletal:     Cervical back: No rigidity or tenderness.  Lymphadenopathy:     Cervical: No cervical adenopathy.  Skin:    General: Skin is warm and dry.  Neurological:     Mental Status: She is alert and oriented to person, place, and time.     Alejandra Hess is a 65 y.o. female whom I am asked to see for evaluation of otalgia in both ears for the past 1 months. There is a prior history of cerumen impaction. The patient has been using ear drops to loosen wax immediately prior to this visit. The patient complains of ear pain.  The patient's history has been marked as reviewed and updated as appropriate. She  has a past medical history of Allergy, Asthma, DM (diabetes mellitus) ([place]), GERD (gastroesophageal reflux disease), Heart murmur, HLD (hyperlipidemia), and Hypertension.  Review of Systems Pertinent items are noted in HPI.    Objective:    Auditory canal(s) of both ears are partially obstructed with cerumen.   Cerumen was removed using gentle irrigation. Tympanic membranes are intact following the procedure.  Auditory canals are normal.  Assessment:    Cerumen Impaction without otitis externa.    Plan:    1. Care instructions given. 2. Home treatment:  ceruminex ear drops. . 3. Follow-up as needed.    BP (!) 158/82 (BP Location: Left Arm, Patient Position: Sitting, Cuff Size: Normal)   Pulse 77   Temp (!) 97 F (36.1 C) (Temporal)   Ht '5\' 4"'$  (1.626 m)   Wt 168 lb 9.6 oz (76.5 kg)   SpO2 95%   BMI 28.94 kg/m  Wt Readings from Last 3 Encounters:  10/27/20 168 lb 9.6 oz (76.5 kg)  09/22/20 154 lb (69.9 kg)  09/19/20 166 lb 6.4 oz (75.5 kg)      Health Maintenance Due  Topic Date Due   TETANUS/TDAP  Never done   Zoster Vaccines- Shingrix (1 of 2) Never done    There are no preventive care reminders to display for this patient.  Lab Results  Component Value Date   TSH 1.53 01/20/2019   Lab Results  Component Value Date   WBC 6.1 07/01/2020   HGB 14.0 07/01/2020   HCT 41.3 07/01/2020   MCV 90.9 07/01/2020   PLT 278.0 07/01/2020   Lab Results  Component Value Date   NA 139 07/01/2020   K 3.9 07/01/2020   CO2 27 07/01/2020   GLUCOSE 159 (H) 07/01/2020   BUN 12 07/01/2020   CREATININE 0.70 07/01/2020   BILITOT 0.5 07/01/2020   ALKPHOS 75 07/01/2020   AST 28 07/01/2020   ALT 50 (H) 07/01/2020   PROT 7.3 07/01/2020   ALBUMIN 4.6 07/01/2020   CALCIUM 9.8 07/01/2020   GFR 89.80 07/01/2020   Lab Results  Component Value Date   CHOL 164 12/09/2019   Lab Results  Component Value Date   HDL 57.10 12/09/2019   Lab Results  Component Value Date   LDLCALC 90 12/09/2019   Lab Results  Component Value Date   TRIG 87.0 12/09/2019   Lab Results  Component Value Date   CHOLHDL 3 12/09/2019   Lab Results  Component Value Date   HGBA1C 7.7 (A) 09/19/2020      Assessment & Plan:   Problem List Items Addressed This Visit       Cardiovascular and Mediastinum   Essential hypertension - Primary   Relevant Medications   olmesartan (BENICAR) 20 MG tablet     Nervous and Auditory   Ceruminosis, bilateral   Chronic eczematous otitis externa of both ears   Relevant Medications   prednisoLONE acetate (PRED FORTE) 1 % ophthalmic suspension     Other   Healthcare maintenance   Relevant Orders   Flu vaccine HIGH DOSE PF (Fluzone High dose) (Completed)    Meds ordered this encounter  Medications   olmesartan (BENICAR) 20 MG tablet    Sig: Take 1 tablet (20 mg total) by mouth daily.    Dispense:  90 tablet    Refill:  0   prednisoLONE acetate (PRED FORTE) 1 % ophthalmic suspension    Sig: One drop  in each ear ever 6 hours as needed for itch.    Dispense:  5 mL    Refill:  1    Follow-up: Return in about 3 months (around 01/26/2021).   Given information on managing hypertension.  We will start with olmesartan at 20 mg.  She will continue to check and record her blood pressures.  Given information on ceruminosis.  Reminded her not to use Q-tips and I will use earwax removal drops.  Suggested that she try a nasal steroid such as Nasonex or Flonase in addition to her Claritin/Zyrtec. [name], MD

## 2020-11-01 ENCOUNTER — Ambulatory Visit: Payer: Medicare Other | Admitting: Family Medicine

## 2020-11-18 DIAGNOSIS — Z23 Encounter for immunization: Secondary | ICD-10-CM | POA: Diagnosis not present

## 2020-11-29 DIAGNOSIS — E119 Type 2 diabetes mellitus without complications: Secondary | ICD-10-CM | POA: Diagnosis not present

## 2020-11-29 DIAGNOSIS — H40023 Open angle with borderline findings, high risk, bilateral: Secondary | ICD-10-CM | POA: Diagnosis not present

## 2020-11-29 DIAGNOSIS — H2513 Age-related nuclear cataract, bilateral: Secondary | ICD-10-CM | POA: Diagnosis not present

## 2020-11-29 LAB — HM DIABETES EYE EXAM

## 2020-11-30 ENCOUNTER — Encounter: Payer: Self-pay | Admitting: Family Medicine

## 2020-11-30 ENCOUNTER — Ambulatory Visit (INDEPENDENT_AMBULATORY_CARE_PROVIDER_SITE_OTHER): Payer: Medicare Other

## 2020-11-30 ENCOUNTER — Encounter: Payer: Self-pay | Admitting: Gastroenterology

## 2020-11-30 DIAGNOSIS — E119 Type 2 diabetes mellitus without complications: Secondary | ICD-10-CM

## 2020-11-30 DIAGNOSIS — I10 Essential (primary) hypertension: Secondary | ICD-10-CM

## 2020-11-30 NOTE — Patient Instructions (Signed)
Visit Information  PATIENT GOALS:  Goals Addressed             This Visit's Progress    Monitor and Manage My Blood Sugar-Diabetes Type 2   On track    Timeframe:  Long-Range Goal Priority:  High Start Date:   09/21/2020                         Expected End Date:  02/24/2021                  Follow Up Date 02/22/2021   - continue to check blood sugar at prescribed times and enter blood sugar readings into daily log - Continue to check blood sugar if you feel it is too high or too low - take the blood sugar log / meter to all doctor visits - Continue to follow a low carbohydrate and low salt meals, watch portion sizes and avoid sugar sweetened drinks - keep appointment with eye doctor -How to treat low blood sugars (Blood sugar less than 70 mg/dl  Please follow the RULE OF 15 for the treatment of hypoglycemia treatment (When your blood sugars are less than 70 mg/ dl) STEP  1:  Take 15 grams of carbohydrates when your blood sugar is low, which includes:   3-4 glucose tabs or  3-4 oz of juice or regular soda or  One tube of glucose gel STEP 2:  Recheck blood sugar in 15 minutes STEP 3:  If your blood sugar is still low at the 15 minute recheck ---then, go back to STEP 1 and treat again with another 15 grams of carbohydrates    Why is this important?   Checking your blood sugar at home helps to keep it from getting very high or very low.  Writing the results in a diary or log helps the doctor know how to care for you.  Your blood sugar log should have the time, date and the results.  Also, write down the amount of insulin or other medicine that you take.  Other information, like what you ate, exercise done and how you were feeling, will also be helpful.          Track and Manage My Blood Pressure-Hypertension   On track    Timeframe:  Long-Range Goal Priority:  Medium Start Date:     09/21/2020                        Expected End Date: 02/24/2021                   Follow Up  Date  02/22/2021  - Continue to check blood pressure 1-2 times per week and write blood pressure results in a log or diary(report blood pressure readings to your provider) - Continue to eat low salt heart healthy meals full of fruits vegetables, whole grains, lean protein, and limit fat and sugars.  - Increase activity as tolerated - Continue to take your medications as prescribed and refill timely - Notify your doctor for new or ongoing symptoms/ concerns.    Why is this important?   You won't feel high blood pressure, but it can still hurt your blood vessels.  High blood pressure can cause heart or kidney problems. It can also cause a stroke.  Making lifestyle changes like losing a little weight or eating less salt will help.  Checking your blood pressure at home  and at different times of the day can help to control blood pressure.  If the doctor prescribes medicine remember to take it the way the doctor ordered.  Call the office if you cannot afford the medicine or if there are questions about it.              Patient verbalizes understanding of instructions provided today and agrees to view in [place].   The patient has been provided with contact information for the care management team and has been advised to call with any health related questions or concerns.  The care management team will reach out to the patient again over the next 1-2 months.   Quinn Plowman RN,BSN,CCM RN Case Manager [place] [phone]

## 2020-11-30 NOTE — Chronic Care Management (AMB) (Signed)
Chronic Care Management   CCM RN Visit Note  11/30/2020 Name: Alejandra Hess MRN: [id number] DOB: [birth year]  Subjective: Alejandra Hess is a 65 y.o. year old female who is a primary care patient of [name], MD. The care management team was consulted for assistance with disease management and care coordination needs.    Engaged with patient by telephone for follow up visit in response to provider referral for case management and/or care coordination services.   Consent to Services:  The patient was given information about Chronic Care Management services, agreed to services, and gave verbal consent prior to initiation of services.  Please see initial visit note for detailed documentation.   Patient agreed to services and verbal consent obtained.   Assessment: Review of patient past medical history, allergies, medications, health status, including review of consultants reports, laboratory and other test data, was performed as part of comprehensive evaluation and provision of chronic care management services.   SDOH (Social Determinants of Health) assessments and interventions performed:    CCM Care Plan  Allergies  Allergen Reactions   Azithromycin Rash   Penicillins Hives and Rash    Outpatient Encounter Medications as of 11/30/2020  Medication Sig Note   albuterol (PROVENTIL) (2.5 MG/3ML) 0.083% nebulizer solution PRN    albuterol (VENTOLIN HFA) 108 (90 Base) MCG/ACT inhaler Inhale into the lungs. PRN    budesonide-formoterol (SYMBICORT) 160-4.5 MCG/ACT inhaler Inhale 1 puff into the lungs at bedtime as needed.    cetirizine (ZYRTEC) 10 MG tablet Take 10 mg by mouth daily.    glucose blood (ONETOUCH VERIO) test strip 1 each by Other route daily. And lancets 1/day    Lancets (ONETOUCH ULTRASOFT) lancets Use as instructed    magnesium oxide (MAG-OX) 400 MG tablet Take 400 mg by mouth daily.     Multiple Vitamin (MULTIVITAMIN) tablet Take 1 tablet by mouth daily.     olmesartan (BENICAR) 20 MG tablet Take 1 tablet (20 mg total) by mouth daily.    pantoprazole (PROTONIX) 40 MG tablet TAKE 1 TABLET BY MOUTH DAILY    prednisoLONE acetate (PRED FORTE) 1 % ophthalmic suspension One drop in each ear ever 6 hours as needed for itch.    Probiotic Product (PROBIOTIC PO) Take 1 capsule by mouth 2 (two) times daily.  09/21/2020: Patient states she takes 1 per day.    repaglinide (PRANDIN) 0.5 MG tablet Take 1 tablet (0.5 mg total) by mouth 2 (two) times daily before a meal.    rosuvastatin (CRESTOR) 20 MG tablet Take 1 tablet (20 mg total) by mouth daily.    triamcinolone (KENALOG) 0.1 % paste Apply a thin coat to sores twice daily. 09/22/2020: prn   triamcinolone (NASACORT) 55 MCG/ACT AERO nasal inhaler Place 1 spray into the nose daily. 09/21/2020: Patient states she uses as needed.    valACYclovir (VALTREX) 1000 MG tablet Take 1 tablet (1,000 mg total) by mouth 3 (three) times daily. 09/21/2020: As needed   venlafaxine (EFFEXOR) 50 MG tablet Take 1 tablet (50 mg total) by mouth daily.    No facility-administered encounter medications on file as of 11/30/2020.    Patient Active Problem List   Diagnosis Date Noted   Ceruminosis, bilateral 10/27/2020   Essential hypertension 10/27/2020   Chronic eczematous otitis externa of both ears 10/27/2020   Acute non-recurrent maxillary sinusitis 09/22/2020   Adrenal nodule ([place]) 09/20/2020   Chronic cough    Heartburn    Asthma 10/08/2019   Hiatal hernia 08/16/2019  Schatzki's ring 08/16/2019   Gastritis 08/16/2019   History of colonic polyps 08/16/2019   Vasculitis ([place]) 05/04/2019   History of COVID-19 04/03/2019   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/03/2019   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   Chronic fatigue 01/20/2019   Hot flashes 01/20/2019   COVID-19 virus infection 12/12/2018   Viral syndrome 12/08/2018   Reactive airway disease 12/08/2018   Glaucoma 11/24/2018   Gastroesophageal reflux disease 11/21/2018   H/O esophagitis  11/21/2018   Elevated LDL cholesterol level 07/04/2018   Controlled type 2 diabetes mellitus without complication, without long-term current use of insulin ([place]) 07/04/2018   Healthcare maintenance 01/27/2018   Asthmatic bronchitis 08/19/2017    Conditions to be addressed/monitored:HTN and DMII  Care Plan : Diabetes Type 2 (Adult)  Updates made by Dannielle Karvonen, RN since 11/30/2020 12:00 AM     Problem: Glycemic Management (Diabetes, Type 2)   Priority: Medium     Long-Range Goal: Glycemic Management Optimized   Start Date: 09/21/2020  Expected End Date: 02/24/2021  This Visit's Progress: On track  Recent Progress: On track  Priority: High  Note:   Objective:  Lab Results  Component Value Date   HGBA1C 7.7 (A) 09/19/2020   Lab Results  Component Value Date   CREATININE 0.70 07/01/2020   CREATININE 0.73 12/09/2019   CREATININE 0.76 05/26/2019  Current Barriers:  Knowledge Deficits related to long term plan for self management of Diabetes Type 2:  Patient states her blood sugars are showing some improvement. She states they were ranging from 200-300's now ranging from 140-260.  She reports her next follow up with endocrinologist is 12/19/2020.  Case Manager Clinical Goal(s):  patient will demonstrate improved adherence to prescribed treatment plan for diabetes self care/management as evidenced by: daily monitoring and recording of CBG  adherence to ADA/ carb modified diet adherence to prescribed medication regimen contacting provider for new or worsened symptoms or questions Interventions:  Collaboration with [name], MD regarding development and update of comprehensive plan of care as evidenced by provider attestation and co-signature Inter-disciplinary care team collaboration (see longitudinal plan of care) Reviewed medications with patient and discussed importance of medication adherence Discussed plans with patient for ongoing care management follow up and  provided patient with direct contact information for care management team Provided patient with written educational materials related to hypo and hyperglycemia and importance of correct treatment Reviewed scheduled/upcoming provider appointments including Patient Goals: - continue to check blood sugar at prescribed times and enter blood sugar readings into daily log - Continue to check blood sugar if you feel it is too high or too low - take the blood sugar log / meter to all doctor visits - Continue to follow a low carbohydrate and low salt meals, watch portion sizes and avoid sugar sweetened drinks - keep appointment with eye doctor -How to treat low blood sugars (Blood sugar less than 70 mg/dl  Please follow the RULE OF 15 for the treatment of hypoglycemia treatment (When your blood sugars are less than 70 mg/ dl) STEP  1:  Take 15 grams of carbohydrates when your blood sugar is low, which includes:   3-4 glucose tabs or  3-4 oz of juice or regular soda or  One tube of glucose gel STEP 2:  Recheck blood sugar in 15 minutes STEP 3:  If your blood sugar is still low at the 15 minute recheck ---then, go back to STEP 1 and treat again with another 15 grams of carbohydrates Follow Up  Plan: The patient has been provided with contact information for the care management team and has been advised to call with any health related questions or concerns.  The care management team will reach out to the patient again over the next 1-2 months          Care Plan : Hypertension (Adult)  Updates made by Dannielle Karvonen, RN since 11/30/2020 12:00 AM     Problem: Hypertension (Hypertension)   Priority: Medium     Long-Range Goal: Hypertension Monitored   Start Date: 09/21/2020  Expected End Date: 02/24/2021  This Visit's Progress: On track  Recent Progress: On track  Priority: Medium  Note:   Objective:  Last practice recorded BP readings:  BP Readings from Last 3 Encounters:  10/27/20 (!)  158/82  09/19/20 (!) 160/74  07/01/20 (!) 154/78  Current Barriers: Patient reports seeing her  Knowledge Deficits related to long term care plan for self management of hypertension and hyperlipidemia:  Patient reports recent blood pressures: 136/83, 153/86, 139/80, 156/95. She states she continues on new blood pressure medication prescribed by her doctor. Patient advised to schedule follow up appointment with primary care provider.  Case Manager Clinical Goal(s):  patient will attend all scheduled medical appointments patient will demonstrate improved adherence to prescribed treatment plan for hypertension as evidenced by taking all medications as prescribed, monitoring and recording blood pressure as directed, adhering to low sodium/DASH diet patient will verbalize basic understanding of hypertension / Hyperlipidemia disease process and self health management plan Patient will monitor blood pressure at least 1-2 times per week and record.   Interventions:  Collaboration with [name], MD regarding development and update of comprehensive plan of care as evidenced by provider attestation and co-signature Inter-disciplinary care team collaboration (see longitudinal plan of care) Evaluation of current treatment plan related to hypertension self management and patient's adherence to plan as established by provider. Reviewed medications with patient and discussed importance of compliance Discussed plans with patient for ongoing care management follow up and provided patient with direct contact information for care management team Advised patient, providing education and rationale, to monitor blood pressure daily and record, calling PCP for findings outside established parameters.  Reviewed scheduled/upcoming provider appointments  Patient sent education article in [place] on hypertension and hyperlipidemia Patient Goals: - Continue to check blood pressure 1-2 times per week and write  blood pressure results in a log or diary(report blood pressure readings to your provider) - Continue to eat low salt heart healthy meals full of fruits vegetables, whole grains, lean protein, and limit fat and sugars.  - Increase activity as tolerated - Continue to take your medications as prescribed and refill timely - Notify your doctor for new or ongoing symptoms/ concerns.  Follow Up Plan: The patient has been provided with contact information for the care management team and has been advised to call with any health related questions or concerns.  The care management team will reach out to the patient again over the next 1-2 months         Plan:The patient has been provided with contact information for the care management team and has been advised to call with any health related questions or concerns.  and The care management team will reach out to the patient again over the next 1-2 months  Quinn Plowman RN,BSN,CCM RN Case Manager [place] [phone]

## 2020-12-06 ENCOUNTER — Ambulatory Visit: Payer: Medicare Other | Admitting: Allergy and Immunology

## 2020-12-19 ENCOUNTER — Other Ambulatory Visit: Payer: Self-pay

## 2020-12-19 ENCOUNTER — Ambulatory Visit (INDEPENDENT_AMBULATORY_CARE_PROVIDER_SITE_OTHER): Payer: Medicare Other | Admitting: Endocrinology

## 2020-12-19 VITALS — BP 166/80 | HR 68 | Ht 64.0 in | Wt 170.4 lb

## 2020-12-19 DIAGNOSIS — E119 Type 2 diabetes mellitus without complications: Secondary | ICD-10-CM | POA: Diagnosis not present

## 2020-12-19 LAB — POCT GLYCOSYLATED HEMOGLOBIN (HGB A1C): Hemoglobin A1C: 7.6 % — AB (ref 4.0–5.6)

## 2020-12-19 MED ORDER — REPAGLINIDE 1 MG PO TABS: 1.0000 mg | ORAL_TABLET | Freq: Two times a day (BID) | ORAL | 3 refills | Status: DC

## 2020-12-19 NOTE — Patient Instructions (Addendum)
Your blood pressure is high today.  Please see your primary care provider soon, to have it rechecked vary the time of day when you check, between before the 3 meals, and at bedtime.  also check if you have symptoms of your blood sugar being too high or too low.  please keep a record of the readings and bring it to your next appointment here (or you can bring the meter itself).  You can write it on any piece of paper.  please call us sooner if your blood sugar goes below 70, or if most of your readings are over 200.   I have sent a prescription to your pharmacy, to increase the repaglinide.   Please come back for a follow-up appointment in 4 months.

## 2020-12-19 NOTE — Progress Notes (Signed)
Subjective:    Patient ID: Alejandra Hess, female    DOB: [birth year], 65 y.o.   MRN: [id number]  HPI Pt returns for f/u of diabetes mellitus: DM type: 2 Dx'ed: 9675 Complications: none  Therapy: repaglinide GDM: never DKA: never Severe hypoglycemia: never Pancreatitis: never Pancreatic imaging: none known SDOH: none Other: she has never been on insulin;  Metformin-XR caused abd pain and diarrhea.  Nesina did not help.  She stopped [place], due to cost.  Interval history: she brings a record of her cbg's which I have reviewed today.  Cbg varies from 130-190.  pt states she feels well in general.   Past Medical History:  Diagnosis Date   Allergy    Asthma    DM (diabetes mellitus) ([place])    GERD (gastroesophageal reflux disease)    Heart murmur    as a child   HLD (hyperlipidemia)    Hypertension     Past Surgical History:  Procedure Laterality Date   72 HOUR [place] STUDY N/A 11/18/2019   Procedure: 24 HOUR PH STUDY;  Surgeon: Irving Copas., MD;  Location: [facility];  Service: Gastroenterology;  Laterality: N/A;   ABDOMINAL HYSTERECTOMY     AUGMENTATION MAMMAPLASTY Bilateral    @ 43 lift with implants   BREAST EXCISIONAL BIOPSY Right    @ 53   BREAST EXCISIONAL BIOPSY Left    @43 ?   CHOLECYSTECTOMY     COLONOSCOPY     ESOPHAGEAL MANOMETRY N/A 11/18/2019   Procedure: ESOPHAGEAL MANOMETRY (EM);  Surgeon: Irving Copas., MD;  Location: [facility];  Service: Gastroenterology;  Laterality: N/A;   UPPER GASTROINTESTINAL ENDOSCOPY      Social History   Socioeconomic History   Marital status: Married    Spouse name: Not on file   Number of children: 2   Years of education: Not on file   Highest education level: Not on file  Occupational History   Occupation: RN  Tobacco Use   Smoking status: Former    Types: Cigarettes    Quit date: 2013    Years since quitting: 9.8   Smokeless tobacco: Never  Vaping Use   Vaping Use: Never used  Substance  and Sexual Activity   Alcohol use: Yes    Comment: 2 glasses of red wine daily   Drug use: Never   Sexual activity: Yes    Partners: Male    Comment: 1st intercourse- 15, partners- 58-, MARRIED- 24  Other Topics Concern   Not on file  Social History Narrative   Not on file   Social Determinants of Health   Financial Resource Strain: Not on file  Food Insecurity: No Food Insecurity   Worried About Charity fundraiser in the Last Year: Never true   [place] in the Last Year: Never true  Transportation Needs: No Transportation Needs   Lack of Transportation (Medical): No   Lack of Transportation (Non-Medical): No  Physical Activity: Not on file  Stress: Not on file  Social Connections: Not on file  Intimate Partner Violence: Not on file    Current Outpatient Medications on File Prior to Visit  Medication Sig Dispense Refill   albuterol (PROVENTIL) (2.5 MG/3ML) 0.083% nebulizer solution PRN  7   albuterol (VENTOLIN HFA) 108 (90 Base) MCG/ACT inhaler Inhale into the lungs. PRN     budesonide-formoterol (SYMBICORT) 160-4.5 MCG/ACT inhaler Inhale 1 puff into the lungs at bedtime as needed. 1 Inhaler 3   cetirizine (ZYRTEC) 10  MG tablet Take 10 mg by mouth daily.     glucose blood (ONETOUCH VERIO) test strip 1 each by Other route daily. And lancets 1/day 100 each 12   Lancets (ONETOUCH ULTRASOFT) lancets Use as instructed 100 each 12   magnesium oxide (MAG-OX) 400 MG tablet Take 400 mg by mouth daily.      Multiple Vitamin (MULTIVITAMIN) tablet Take 1 tablet by mouth daily.     olmesartan (BENICAR) 20 MG tablet Take 1 tablet (20 mg total) by mouth daily. 90 tablet 0   pantoprazole (PROTONIX) 40 MG tablet TAKE 1 TABLET BY MOUTH DAILY 90 tablet 1   prednisoLONE acetate (PRED FORTE) 1 % ophthalmic suspension One drop in each ear ever 6 hours as needed for itch. 5 mL 1   Probiotic Product (PROBIOTIC PO) Take 1 capsule by mouth 2 (two) times daily.      rosuvastatin (CRESTOR) 20 MG  tablet Take 1 tablet (20 mg total) by mouth daily. 90 tablet 3   triamcinolone (NASACORT) 55 MCG/ACT AERO nasal inhaler Place 1 spray into the nose daily.     valACYclovir (VALTREX) 1000 MG tablet Take 1 tablet (1,000 mg total) by mouth 3 (three) times daily. 21 tablet 0   venlafaxine (EFFEXOR) 50 MG tablet Take 1 tablet (50 mg total) by mouth daily. 90 tablet 0   triamcinolone (KENALOG) 0.1 % paste Apply a thin coat to sores twice daily. 5 g 1   No current facility-administered medications on file prior to visit.    Allergies  Allergen Reactions   Azithromycin Rash   Penicillins Hives and Rash    Family History  Problem Relation Age of Onset   Hearing loss Mother    Hyperlipidemia Mother    Hypertension Mother    Diabetes Mother    Heart attack Father    Heart disease Father    Hyperlipidemia Father    Hypertension Father    Melanoma Father    Asthma Sister    Hyperlipidemia Sister    Hypertension Sister    Hyperlipidemia Sister    Breast cancer Sister 4   Asthma Sister    Depression Sister    Heart attack Sister    Heart disease Sister    Hypertension Sister    Diabetes Sister    Breast cancer Maternal Aunt        over 82   Breast cancer Paternal Aunt        over 100   Breast cancer Paternal Grandmother        over 2    Breast cancer Maternal Aunt        over 48    Colon cancer Maternal Grandfather    Esophageal cancer Neg Hx    Inflammatory bowel disease Neg Hx    Liver disease Neg Hx    Pancreatic cancer Neg Hx    Rectal cancer Neg Hx    Stomach cancer Neg Hx     BP (!) 166/80 (BP Location: Right Arm, Patient Position: Sitting, Cuff Size: Normal)   Pulse 68   Ht 5\' 4"  (1.626 m)   Wt 170 lb 6.4 oz (77.3 kg)   SpO2 97%   BMI 29.25 kg/m   Review of Systems She denies hypoglycemia.      Objective:   Physical Exam Pulses: dorsalis pedis intact bilat.   MSK: no deformity of the feet CV: no leg edema Skin:  no ulcer on the feet.  normal color and  temp on the  feet.   Neuro: sensation is intact to touch on the feet Ext: both great toenails are regrowing.    Lab Results  Component Value Date   HGBA1C 7.6 (A) 12/19/2020   Lab Results  Component Value Date   CREATININE 0.70 07/01/2020   BUN 12 07/01/2020   NA 139 07/01/2020   K 3.9 07/01/2020   CL 103 07/01/2020   CO2 27 07/01/2020       Assessment & Plan:  Type 2 DM: uncontrolled  Patient Instructions  Your blood pressure is high today.  Please see your primary care provider soon, to have it rechecked vary the time of day when you check, between before the 3 meals, and at bedtime.  also check if you have symptoms of your blood sugar being too high or too low.  please keep a record of the readings and bring it to your next appointment here (or you can bring the meter itself).  You can write it on any piece of paper.  please call us sooner if your blood sugar goes below 70, or if most of your readings are over 200.   I have sent a prescription to your pharmacy, to increase the repaglinide.   Please come back for a follow-up appointment in 4 months.

## 2020-12-26 DIAGNOSIS — E119 Type 2 diabetes mellitus without complications: Secondary | ICD-10-CM

## 2020-12-26 DIAGNOSIS — I10 Essential (primary) hypertension: Secondary | ICD-10-CM

## 2020-12-30 ENCOUNTER — Telehealth: Payer: Self-pay | Admitting: *Deleted

## 2020-12-30 NOTE — Chronic Care Management (AMB) (Signed)
  Care Management   Note  12/30/2020 Name: Jasiah Buntin MRN: [id number] DOB: [birth year]  Marylynn Rigdon is a 65 y.o. year old female who is a primary care patient of [name], MD and is actively engaged with the care management team. I reached out to [facility] by phone today to assist with re-scheduling a follow up visit with the RN Case Manager  Follow up plan: Unsuccessful telephone outreach attempt made. A HIPAA compliant phone message was left for the patient providing contact information and requesting a return call.   Julian Hy, [place] Management  Direct Dial: [phone]

## 2021-01-12 NOTE — Chronic Care Management (AMB) (Signed)
  Care Management   Note  01/12/2021 Name: Angeliki Mates MRN: [id number] DOB: 1955/05/30  Traniece Boffa is a 65 y.o. year old female who is a primary care patient of [name], MD and is actively engaged with the care management team. I reached out to [facility] by phone today to assist with re-scheduling a follow up visit with the RN Case Manager  Follow up plan: Telephone appointment with care management team member scheduled for: 03/29/2021  Julian Hy, [place], [place] Management  Direct Dial: [phone]

## 2021-01-13 ENCOUNTER — Other Ambulatory Visit: Payer: Self-pay | Admitting: Family Medicine

## 2021-01-13 DIAGNOSIS — I10 Essential (primary) hypertension: Secondary | ICD-10-CM

## 2021-01-16 ENCOUNTER — Other Ambulatory Visit: Payer: Self-pay | Admitting: Family Medicine

## 2021-01-16 DIAGNOSIS — E119 Type 2 diabetes mellitus without complications: Secondary | ICD-10-CM

## 2021-01-16 DIAGNOSIS — I10 Essential (primary) hypertension: Secondary | ICD-10-CM

## 2021-02-01 ENCOUNTER — Encounter: Payer: Medicare Other | Admitting: Obstetrics & Gynecology

## 2021-02-01 ENCOUNTER — Other Ambulatory Visit: Payer: Self-pay

## 2021-02-01 ENCOUNTER — Encounter: Payer: Self-pay | Admitting: Obstetrics & Gynecology

## 2021-02-01 VITALS — BP 130/84 | HR 86 | Ht 63.25 in | Wt 169.0 lb

## 2021-02-01 DIAGNOSIS — Z9289 Personal history of other medical treatment: Secondary | ICD-10-CM

## 2021-02-01 NOTE — Progress Notes (Signed)
Patient was not seen today for appointment due to provider being delayed in surgery.

## 2021-02-03 ENCOUNTER — Other Ambulatory Visit: Payer: Self-pay | Admitting: Obstetrics & Gynecology

## 2021-02-03 DIAGNOSIS — Z1231 Encounter for screening mammogram for malignant neoplasm of breast: Secondary | ICD-10-CM

## 2021-02-04 ENCOUNTER — Encounter: Payer: Self-pay | Admitting: Endocrinology

## 2021-02-06 ENCOUNTER — Other Ambulatory Visit: Payer: Self-pay | Admitting: Endocrinology

## 2021-02-06 DIAGNOSIS — L920 Granuloma annulare: Secondary | ICD-10-CM | POA: Diagnosis not present

## 2021-02-06 MED ORDER — NATEGLINIDE 120 MG PO TABS: 120.0000 mg | ORAL_TABLET | Freq: Two times a day (BID) | ORAL | 3 refills | Status: DC

## 2021-02-08 ENCOUNTER — Ambulatory Visit (INDEPENDENT_AMBULATORY_CARE_PROVIDER_SITE_OTHER): Payer: Medicare Other

## 2021-02-08 DIAGNOSIS — Z0000 Encounter for general adult medical examination without abnormal findings: Secondary | ICD-10-CM

## 2021-02-08 NOTE — Progress Notes (Signed)
Subjective:   Alejandra Hess is a 65 y.o. female who presents for Medicare Annual (Subsequent) preventive examination.  I connected with [name] today by telephone and verified that I am speaking with the correct person using two identifiers. Location patient: home Location provider: work Persons participating in the virtual visit: patient, provider.   I discussed the limitations, risks, security and privacy concerns of performing an evaluation and management service by telephone and the availability of in person appointments. I also discussed with the patient that there may be a patient responsible charge related to this service. The patient expressed understanding and verbally consented to this telephonic visit.    Interactive audio and video telecommunications were attempted between this provider and patient, however failed, due to patient having technical difficulties OR patient did not have access to video capability.  We continued and completed visit with audio only.    Review of Systems     Cardiac Risk Factors include: advanced age (>36men, >68 women);diabetes mellitus;dyslipidemia;hypertension     Objective:    Today's Vitals   There is no height or weight on file to calculate BMI.  Advanced Directives 02/08/2021 09/21/2020 12/08/2019  Does Patient Have a Medical Advance Directive? Yes Yes Yes  Type of Paramedic of [place];Living will [place];Living will [place];Living will  Does patient want to make changes to medical advance directive? - No - Patient declined -  Copy of [place] in Chart? No - copy requested - No - copy requested    Current Medications (verified) Outpatient Encounter Medications as of 02/08/2021  Medication Sig   albuterol (PROVENTIL) (2.5 MG/3ML) 0.083% nebulizer solution PRN   albuterol (VENTOLIN HFA) 108 (90 Base) MCG/ACT inhaler Inhale into the  lungs. PRN   budesonide-formoterol (SYMBICORT) 160-4.5 MCG/ACT inhaler Inhale 1 puff into the lungs at bedtime as needed.   cetirizine (ZYRTEC) 10 MG tablet Take 10 mg by mouth daily.   glucose blood (ONETOUCH VERIO) test strip 1 each by Other route daily. And lancets 1/day   Lancets (ONETOUCH ULTRASOFT) lancets Use as instructed   magnesium oxide (MAG-OX) 400 MG tablet Take 400 mg by mouth daily.    Multiple Vitamin (MULTIVITAMIN) tablet Take 1 tablet by mouth daily.   nateglinide (STARLIX) 120 MG tablet Take 1 tablet (120 mg total) by mouth 2 (two) times daily with a meal.   olmesartan (BENICAR) 20 MG tablet TAKE 1 TABLET(20 MG) BY MOUTH DAILY   pantoprazole (PROTONIX) 40 MG tablet TAKE 1 TABLET BY MOUTH DAILY   prednisoLONE acetate (PRED FORTE) 1 % ophthalmic suspension One drop in each ear ever 6 hours as needed for itch.   Probiotic Product (PROBIOTIC PO) Take 1 capsule by mouth 2 (two) times daily.    rosuvastatin (CRESTOR) 20 MG tablet Take 1 tablet (20 mg total) by mouth daily.   triamcinolone (NASACORT) 55 MCG/ACT AERO nasal inhaler Place 1 spray into the nose daily.   valACYclovir (VALTREX) 1000 MG tablet Take 1 tablet (1,000 mg total) by mouth 3 (three) times daily.   venlafaxine (EFFEXOR) 50 MG tablet Take 1 tablet (50 mg total) by mouth daily.   No facility-administered encounter medications on file as of 02/08/2021.    Allergies (verified) Azithromycin and Penicillins   History: Past Medical History:  Diagnosis Date   Allergy    Asthma    DM (diabetes mellitus) (HCC)    GERD (gastroesophageal reflux disease)    Heart murmur  as a child   HLD (hyperlipidemia)    HSV-1 infection    Hypertension    Past Surgical History:  Procedure Laterality Date   49 HOUR [place] STUDY N/A 11/18/2019   Procedure: 24 HOUR PH STUDY;  Surgeon: Irving Copas., MD;  Location: [facility];  Service: Gastroenterology;  Laterality: N/A;   ABDOMINAL HYSTERECTOMY     AUGMENTATION  MAMMAPLASTY Bilateral    @ 43 lift with implants   BREAST EXCISIONAL BIOPSY Right    @ 53   BREAST EXCISIONAL BIOPSY Left    @43 ?   CHOLECYSTECTOMY     COLONOSCOPY     ESOPHAGEAL MANOMETRY N/A 11/18/2019   Procedure: ESOPHAGEAL MANOMETRY (EM);  Surgeon: Irving Copas., MD;  Location: [facility];  Service: Gastroenterology;  Laterality: N/A;   UPPER GASTROINTESTINAL ENDOSCOPY     Family History  Problem Relation Age of Onset   Hearing loss Mother    Hyperlipidemia Mother    Hypertension Mother    Diabetes Mother    Heart attack Father    Heart disease Father    Hyperlipidemia Father    Hypertension Father    Melanoma Father    Asthma Sister    Hyperlipidemia Sister    Hypertension Sister    Hyperlipidemia Sister    Breast cancer Sister 68   Asthma Sister    Depression Sister    Heart attack Sister    Heart disease Sister    Hypertension Sister    Diabetes Sister    Breast cancer Maternal Aunt        over 71   Breast cancer Paternal Aunt        over 46   Breast cancer Paternal Grandmother        over 23    Breast cancer Maternal Aunt        over 29    Colon cancer Maternal Grandfather    Esophageal cancer Neg Hx    Inflammatory bowel disease Neg Hx    Liver disease Neg Hx    Pancreatic cancer Neg Hx    Rectal cancer Neg Hx    Stomach cancer Neg Hx    Social History   Socioeconomic History   Marital status: Married    Spouse name: Not on file   Number of children: 2   Years of education: Not on file   Highest education level: Not on file  Occupational History   Occupation: RN  Tobacco Use   Smoking status: Former    Types: Cigarettes    Quit date: 2013    Years since quitting: 9.9   Smokeless tobacco: Never  Vaping Use   Vaping Use: Never used  Substance and Sexual Activity   Alcohol use: Yes    Comment: 2 glasses of red wine daily   Drug use: Never   Sexual activity: Yes    Partners: Male    Birth control/protection: Surgical     Comment: 1st intercourse- 15, partners- 11-, MARRIED- 61, hysterectomy  Other Topics Concern   Not on file  Social History Narrative   Not on file   Social Determinants of Health   Financial Resource Strain: Low Risk    Difficulty of Paying Living Expenses: Not hard at all  Food Insecurity: No Food Insecurity   Worried About Charity fundraiser in the Last Year: Never true   [place] in the Last Year: Never true  Transportation Needs: No Transportation Needs   Lack of  Transportation (Medical): No   Lack of Transportation (Non-Medical): No  Physical Activity: Inactive   Days of Exercise per Week: 0 days   Minutes of Exercise per Session: 0 min  Stress: No Stress Concern Present   Feeling of Stress : Not at all  Social Connections: Moderately Integrated   Frequency of Communication with Friends and Family: Twice a week   Frequency of Social Gatherings with Friends and Family: Twice a week   Attends Religious Services: Never   Marine scientist or Organizations: Yes   Attends Music therapist: More than 4 times per year   Marital Status: Married    Tobacco Counseling Counseling given: Not Answered   Clinical Intake:  Pre-visit preparation completed: Yes  Pain : No/denies pain     Nutritional Risks: None Diabetes: Yes CBG done?: No Did pt. bring in CBG monitor from home?: No  How often do you need to have someone help you when you read instructions, pamphlets, or other written materials from your doctor or pharmacy?: 1 - Never What is the last grade level you completed in school?: masters  Diabetic?yes Nutrition Risk Assessment:  Has the patient had any N/V/D within the last 2 months?  No  Does the patient have any non-healing wounds?  No  Has the patient had any unintentional weight loss or weight gain?  No   Diabetes:  Is the patient diabetic?  Yes  If diabetic, was a CBG obtained today?  No  Did the patient bring in their glucometer  from home?  No  How often do you monitor your CBG's? Daily .   Financial Strains and Diabetes Management:  Are you having any financial strains with the device, your supplies or your medication? No .  Does the patient want to be seen by Chronic Care Management for management of their diabetes?  No  Would the patient like to be referred to a Nutritionist or for Diabetic Management?  No   Diabetic Exams:  Diabetic Eye Exam: Completed 09/2020 Diabetic Foot Exam: Overdue, Pt has been advised about the importance in completing this exam. Pt is scheduled for diabetic foot exam on next office visit .   Interpreter Needed?: No  Information entered by :: [place] of Daily Living In your present state of health, do you have any difficulty performing the following activities: 02/08/2021  Hearing? N  Vision? N  Difficulty concentrating or making decisions? N  Walking or climbing stairs? N  Dressing or bathing? N  Doing errands, shopping? N  Preparing Food and eating ? N  Using the Toilet? N  In the past six months, have you accidently leaked urine? N  Do you have problems with loss of bowel control? N  Managing your Medications? N  Managing your Finances? N  Housekeeping or managing your Housekeeping? N  Some recent data might be hidden    Patient Care Team: [name], MD as PCP - General (Family Medicine) Dannielle Karvonen, RN as Case Manager  Indicate any recent Medical Services you may have received from other than [facility] providers in the past year (date may be approximate).     Assessment:   This is a routine wellness examination for Alejandra Hess.  Hearing/Vision screen Vision Screening - Comments:: Annual eye exams   Dietary issues and exercise activities discussed: Current Exercise Habits: The patient does not participate in regular exercise at present, Exercise limited by: orthopedic condition(s)   Goals Addressed  This Visit's  Progress    Patient Stated   On track    Maintain current level of activity       Depression Screen PHQ 2/9 Scores 02/08/2021 02/08/2021 10/27/2020 10/27/2020 09/22/2020 09/21/2020 09/06/2020  PHQ - 2 Score 0 0 0 0 0 0 0  PHQ- 9 Score - - 0 - - - -    Fall Risk Fall Risk  02/08/2021 10/27/2020 09/22/2020 09/21/2020 09/06/2020  Falls in the past year? 0 0 0 0 0  Number falls in past yr: 0 - - 0 -  Injury with Fall? 0 - - - -  Follow up Falls evaluation completed - - - -    FALL RISK PREVENTION PERTAINING TO THE HOME:  Any stairs in or around the home? Yes  If so, are there any without handrails? No  Home free of loose throw rugs in walkways, pet beds, electrical cords, etc? Yes  Adequate lighting in your home to reduce risk of falls? Yes   ASSISTIVE DEVICES UTILIZED TO PREVENT FALLS:  Life alert? No  Use of a cane, walker or w/c? No  Grab bars in the bathroom? Yes  Shower chair or bench in shower? Yes  Elevated toilet seat or a handicapped toilet? Yes    Cognitive Function:  Normal cognitive status assessed by direct observation by this Nurse Health Advisor. No abnormalities found.        Immunizations Immunization History  Administered Date(s) Administered   Fluad Quad(high Dose 65+) 10/18/2015, 10/21/2018   Influenza Inj Mdck Quad Pf 12/10/2017   Influenza, High Dose Seasonal PF 10/27/2020   Influenza-Unspecified 09/26/2016, 11/23/2019   PFIZER(Purple Top)SARS-COV-2 Vaccination 04/04/2019, 04/29/2019, 12/01/2019, 06/05/2020   PPD Test 07/24/2016   Pneumococcal Conjugate-13 12/09/2019   Pneumococcal Polysaccharide-23 10/21/2018    TDAP status: Due, Education has been provided regarding the importance of this vaccine. Advised may receive this vaccine at local pharmacy or Health Dept. Aware to provide a copy of the vaccination record if obtained from local pharmacy or Health Dept. Verbalized acceptance and understanding.  Flu Vaccine status: Up to date  Pneumococcal  vaccine status: Up to date  Covid-19 vaccine status: Completed vaccines  Qualifies for Shingles Vaccine? Yes   Zostavax completed No   Shingrix Completed?: No.    Education has been provided regarding the importance of this vaccine. Patient has been advised to call insurance company to determine out of pocket expense if they have not yet received this vaccine. Advised may also receive vaccine at local pharmacy or Health Dept. Verbalized acceptance and understanding.  Screening Tests Health Maintenance  Topic Date Due   TETANUS/TDAP  Never done   Zoster Vaccines- Shingrix (1 of 2) Never done   COVID-19 Vaccine (5 - Booster for Pfizer series) 07/31/2020   MAMMOGRAM  01/13/2021   HEMOGLOBIN A1C  06/19/2021   OPHTHALMOLOGY EXAM  11/29/2021   FOOT EXAM  12/19/2021   COLONOSCOPY (Pts 45-20yrs Insurance coverage will need to be confirmed)  05/02/2025   Pneumonia Vaccine 39+ Years old  Completed   INFLUENZA VACCINE  Completed   DEXA SCAN  Completed   Hepatitis C Screening  Completed   HPV VACCINES  Aged Out    Health Maintenance  Health Maintenance Due  Topic Date Due   TETANUS/TDAP  Never done   Zoster Vaccines- Shingrix (1 of 2) Never done   COVID-19 Vaccine (5 - Booster for [place] series) 07/31/2020   MAMMOGRAM  01/13/2021    Colorectal cancer screening: Type of  screening: Colonoscopy. Completed 05/03/2015. Repeat every 10 years  Mammogram status: Ordered scheduled 03/01/2021. Pt provided with contact info and advised to call to schedule appt.   Bone Density status: Completed 10/30/2018. Results reflect: Bone density results: OSTEOPENIA. Repeat every 5 years.  Lung Cancer Screening: (Low Dose CT Chest recommended if Age 24-80 years, 30 pack-year currently smoking OR have quit w/in 15years.) does not qualify.   Lung Cancer Screening Referral: n/a  Additional Screening:  Hepatitis C Screening: does not qualify; Completed 03/26/2014  Vision Screening: Recommended annual  ophthalmology exams for early detection of glaucoma and other disorders of the eye. Is the patient up to date with their annual eye exam?  Yes  Who is the provider or what is the name of the office in which the patient attends annual eye exams? Dr.Groat  If pt is not established with a provider, would they like to be referred to a provider to establish care? No .   Dental Screening: Recommended annual dental exams for proper oral hygiene  Community Resource Referral / Chronic Care Management: CRR required this visit?  No   CCM required this visit?  No      Plan:     I have personally reviewed and noted the following in the patients chart:   Medical and social history Use of alcohol, tobacco or illicit drugs  Current medications and supplements including opioid prescriptions.  Functional ability and status Nutritional status Physical activity Advanced directives List of other physicians Hospitalizations, surgeries, and ER visits in previous 12 months Vitals Screenings to include cognitive, depression, and falls Referrals and appointments  In addition, I have reviewed and discussed with patient certain preventive protocols, quality metrics, and best practice recommendations. A written personalized care plan for preventive services as well as general preventive health recommendations were provided to patient.     Randel Pigg, LPN   12/29/1115   Nurse Notes: none

## 2021-02-08 NOTE — Patient Instructions (Signed)
Alejandra Hess , Thank you for taking time to come for your Medicare Wellness Visit. I appreciate your ongoing commitment to your health goals. Please review the following plan we discussed and let me know if I can assist you in the future.   Screening recommendations/referrals: Colonoscopy: 05/13/2015 Mammogram: scheduled 03/01/2021 Bone Density: 10/30/2018 Recommended yearly ophthalmology/optometry visit for glaucoma screening and checkup Recommended yearly dental visit for hygiene and checkup  Vaccinations: Influenza vaccine: completed  Pneumococcal vaccine: completed  Tdap vaccine: due  Shingles vaccine: will consider     Advanced directives: will provide copies   Conditions/risks identified: none   Next appointment: none    Preventive Care 22 Years and Older, Female Preventive care refers to lifestyle choices and visits with your health care provider that can promote health and wellness. What does preventive care include? A yearly physical exam. This is also called an annual well check. Dental exams once or twice a year. Routine eye exams. Ask your health care provider how often you should have your eyes checked. Personal lifestyle choices, including: Daily care of your teeth and gums. Regular physical activity. Eating a healthy diet. Avoiding tobacco and drug use. Limiting alcohol use. Practicing safe sex. Taking low-dose aspirin every day. Taking vitamin and mineral supplements as recommended by your health care provider. What happens during an annual well check? The services and screenings done by your health care provider during your annual well check will depend on your age, overall health, lifestyle risk factors, and family history of disease. Counseling  Your health care provider may ask you questions about your: Alcohol use. Tobacco use. Drug use. Emotional well-being. Home and relationship well-being. Sexual activity. Eating habits. History of  falls. Memory and ability to understand (cognition). Work and work Statistician. Reproductive health. Screening  You may have the following tests or measurements: Height, weight, and BMI. Blood pressure. Lipid and cholesterol levels. These may be checked every 5 years, or more frequently if you are over 72 years old. Skin check. Lung cancer screening. You may have this screening every year starting at age 69 if you have a 30-pack-year history of smoking and currently smoke or have quit within the past 15 years. Fecal occult blood test (FOBT) of the stool. You may have this test every year starting at age 89. Flexible sigmoidoscopy or colonoscopy. You may have a sigmoidoscopy every 5 years or a colonoscopy every 10 years starting at age 23. Hepatitis C blood test. Hepatitis B blood test. Sexually transmitted disease (STD) testing. Diabetes screening. This is done by checking your blood sugar (glucose) after you have not eaten for a while (fasting). You may have this done every 1-3 years. Bone density scan. This is done to screen for osteoporosis. You may have this done starting at age 47. Mammogram. This may be done every 1-2 years. Talk to your health care provider about how often you should have regular mammograms. Talk with your health care provider about your test results, treatment options, and if necessary, the need for more tests. Vaccines  Your health care provider may recommend certain vaccines, such as: Influenza vaccine. This is recommended every year. Tetanus, diphtheria, and acellular pertussis (Tdap, Td) vaccine. You may need a Td booster every 10 years. Zoster vaccine. You may need this after age 71. Pneumococcal 13-valent conjugate (PCV13) vaccine. One dose is recommended after age 38. Pneumococcal polysaccharide (PPSV23) vaccine. One dose is recommended after age 5. Talk to your health care provider about which screenings and vaccines you  need and how often you need  them. This information is not intended to replace advice given to you by your health care provider. Make sure you discuss any questions you have with your health care provider. Document Released: 03/11/2015 Document Revised: 11/02/2015 Document Reviewed: 12/14/2014 Elsevier Interactive Patient Education  2017 [place] Prevention in the Home Falls can cause injuries. They can happen to people of all ages. There are many things you can do to make your home safe and to help prevent falls. What can I do on the outside of my home? Regularly fix the edges of walkways and driveways and fix any cracks. Remove anything that might make you trip as you walk through a door, such as a raised step or threshold. Trim any bushes or trees on the path to your home. Use bright outdoor lighting. Clear any walking paths of anything that might make someone trip, such as rocks or tools. Regularly check to see if handrails are loose or broken. Make sure that both sides of any steps have handrails. Any raised decks and porches should have guardrails on the edges. Have any leaves, snow, or ice cleared regularly. Use sand or salt on walking paths during winter. Clean up any spills in your garage right away. This includes oil or grease spills. What can I do in the bathroom? Use night lights. Install grab bars by the toilet and in the tub and shower. Do not use towel bars as grab bars. Use non-skid mats or decals in the tub or shower. If you need to sit down in the shower, use a plastic, non-slip stool. Keep the floor dry. Clean up any water that spills on the floor as soon as it happens. Remove soap buildup in the tub or shower regularly. Attach bath mats securely with double-sided non-slip rug tape. Do not have throw rugs and other things on the floor that can make you trip. What can I do in the bedroom? Use night lights. Make sure that you have a light by your bed that is easy to reach. Do not use  any sheets or blankets that are too big for your bed. They should not hang down onto the floor. Have a firm chair that has side arms. You can use this for support while you get dressed. Do not have throw rugs and other things on the floor that can make you trip. What can I do in the kitchen? Clean up any spills right away. Avoid walking on wet floors. Keep items that you use a lot in easy-to-reach places. If you need to reach something above you, use a strong step stool that has a grab bar. Keep electrical cords out of the way. Do not use floor polish or wax that makes floors slippery. If you must use wax, use non-skid floor wax. Do not have throw rugs and other things on the floor that can make you trip. What can I do with my stairs? Do not leave any items on the stairs. Make sure that there are handrails on both sides of the stairs and use them. Fix handrails that are broken or loose. Make sure that handrails are as long as the stairways. Check any carpeting to make sure that it is firmly attached to the stairs. Fix any carpet that is loose or worn. Avoid having throw rugs at the top or bottom of the stairs. If you do have throw rugs, attach them to the floor with carpet tape. Make sure that  you have a light switch at the top of the stairs and the bottom of the stairs. If you do not have them, ask someone to add them for you. What else can I do to help prevent falls? Wear shoes that: Do not have high heels. Have rubber bottoms. Are comfortable and fit you well. Are closed at the toe. Do not wear sandals. If you use a stepladder: Make sure that it is fully opened. Do not climb a closed stepladder. Make sure that both sides of the stepladder are locked into place. Ask someone to hold it for you, if possible. Clearly mark and make sure that you can see: Any grab bars or handrails. First and last steps. Where the edge of each step is. Use tools that help you move around (mobility aids)  if they are needed. These include: Canes. Walkers. Scooters. Crutches. Turn on the lights when you go into a dark area. Replace any light bulbs as soon as they burn out. Set up your furniture so you have a clear path. Avoid moving your furniture around. If any of your floors are uneven, fix them. If there are any pets around you, be aware of where they are. Review your medicines with your doctor. Some medicines can make you feel dizzy. This can increase your chance of falling. Ask your doctor what other things that you can do to help prevent falls. This information is not intended to replace advice given to you by your health care provider. Make sure you discuss any questions you have with your health care provider. Document Released: 12/09/2008 Document Revised: 07/21/2015 Document Reviewed: 03/19/2014 Elsevier Interactive Patient Education  2017 [employer].

## 2021-02-13 ENCOUNTER — Other Ambulatory Visit: Payer: Self-pay

## 2021-02-13 ENCOUNTER — Ambulatory Visit (INDEPENDENT_AMBULATORY_CARE_PROVIDER_SITE_OTHER): Payer: Medicare Other | Admitting: Obstetrics & Gynecology

## 2021-02-13 ENCOUNTER — Encounter: Payer: Self-pay | Admitting: Obstetrics & Gynecology

## 2021-02-13 VITALS — BP 152/92 | HR 74 | Ht 63.5 in | Wt 167.0 lb

## 2021-02-13 DIAGNOSIS — M810 Age-related osteoporosis without current pathological fracture: Secondary | ICD-10-CM | POA: Diagnosis not present

## 2021-02-13 DIAGNOSIS — Z9189 Other specified personal risk factors, not elsewhere classified: Secondary | ICD-10-CM

## 2021-02-13 DIAGNOSIS — Z01419 Encounter for gynecological examination (general) (routine) without abnormal findings: Secondary | ICD-10-CM

## 2021-02-13 DIAGNOSIS — Z780 Asymptomatic menopausal state: Secondary | ICD-10-CM

## 2021-02-13 DIAGNOSIS — Z90710 Acquired absence of both cervix and uterus: Secondary | ICD-10-CM

## 2021-02-13 DIAGNOSIS — Z9289 Personal history of other medical treatment: Secondary | ICD-10-CM | POA: Diagnosis not present

## 2021-02-13 NOTE — Progress Notes (Signed)
Alejandra Hess [facility] [birth year] [id number]   History:    65 y.o. G4P2A2L2 married.  Moved from [place] to be closer to the grandchildren (67).   RP:  Established patient presenting for annual gyn exam    HPI: Status post TAH.  Menopause, well on no hormone replacement therapy.  No PMB.  No pelvic pain.  No pain with intercourse.  Pap Neg in 2020.  Urine and bowel movements normal.  Breast normal.  Mammo scheduled for 02/2021.  Body mass index 29.12 .  Good fitness and healthy nutrition.  Health labs with Dr. Alfonso Ramus.  Last colonoscopy 2017.  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  Will schedule a repeat BD here now.  Past medical history,surgical history, family history and social history were all reviewed and documented in the EPIC chart.  Gynecologic History No LMP recorded. Patient has had a hysterectomy.  Obstetric History OB History  Gravida Para Term Preterm AB Living  4 2     2 2   SAB IAB Ectopic Multiple Live Births  2            # Outcome Date GA Lbr Len/2nd Weight Sex Delivery Anes PTL Lv  4 SAB           3 SAB           2 Para           1 Para              ROS: A ROS was performed and pertinent positives and negatives are included in the history. GENERAL: No fevers or chills. HEENT: No change in vision, no earache, sore throat or sinus congestion. NECK: No pain or stiffness. CARDIOVASCULAR: No chest pain or pressure. No palpitations. PULMONARY: No shortness of breath, cough or wheeze. GASTROINTESTINAL: No abdominal pain, nausea, vomiting or diarrhea, melena or bright red blood per rectum. GENITOURINARY: No urinary frequency, urgency, hesitancy or dysuria. MUSCULOSKELETAL: No joint or muscle pain, no back pain, no recent trauma. DERMATOLOGIC: No rash, no itching, no lesions. ENDOCRINE: No polyuria, polydipsia, no heat or cold intolerance. No recent change in weight.  HEMATOLOGICAL: No anemia or easy bruising or bleeding. NEUROLOGIC: No headache, seizures, numbness, tingling or weakness. PSYCHIATRIC: No depression, no loss of interest in normal activity or change in sleep pattern.     Exam:   BP (!) 152/92    Pulse 74    Ht 5' 3.5" (1.613 m)    Wt 167 lb (75.8 kg)    SpO2 98%    BMI 29.12 kg/m   Body mass index is 29.12 kg/m.  General appearance : Well developed well nourished female. No acute distress HEENT: Eyes: no retinal hemorrhage or exudates,  Neck supple, trachea midline, no carotid bruits, no thyroidmegaly Lungs: Clear to auscultation, no rhonchi or wheezes, or rib retractions  Heart: Regular rate and rhythm, no murmurs or gallops Breast:Examined in sitting and supine position were symmetrical in appearance, no palpable masses or tenderness,  no skin retraction, no nipple inversion, no nipple discharge, no skin discoloration, no axillary or supraclavicular lymphadenopathy Abdomen: no palpable masses or tenderness, no rebound or guarding Extremities: no edema or skin discoloration or tenderness  Pelvic: Vulva: Normal             Vagina: No gross lesions or discharge  Cervix/Uterus absent  Adnexa  Without masses or tenderness  Anus: Normal  Assessment/Plan:  65 y.o. female for annual exam   1. Well female exam with routine gynecological exam Status post TAH.  Menopause, well on no hormone replacement therapy.  No PMB.  No pelvic pain.  No pain with intercourse.  Pap Neg in 2020.  Urine and bowel movements normal.  Breast normal.  Mammo scheduled for 02/2021.  Body mass index 29.12 .  Good fitness and healthy nutrition.  Health labs with Dr. Alfonso Ramus.  Last colonoscopy 2017.  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  Will schedule a repeat BD here now.  2. At risk of fracture due to osteoporosis  3. S/P total hysterectomy  4.  Postmenopause Status post TAH.  Menopause, well on no hormone replacement therapy.  No PMB.  No pelvic pain.  No pain with intercourse.  5. 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  Will schedule a repeat BD here now.  Vit D, Ca++, regular weight bearing physical activities. - DG Bone Density; Future  6. Personal history of other medical treatment  Other orders - triamcinolone cream (KENALOG) 0.1 %; SMARTSIG:1 Application Topical 2-3 Times Daily - hydroxychloroquine (PLAQUENIL) 200 MG tablet; Take 200 mg by mouth 2 (two) times daily.   Princess Bruins MD, 9:43 AM 02/13/2021

## 2021-02-15 ENCOUNTER — Other Ambulatory Visit: Payer: Self-pay | Admitting: Obstetrics & Gynecology

## 2021-02-15 ENCOUNTER — Ambulatory Visit (INDEPENDENT_AMBULATORY_CARE_PROVIDER_SITE_OTHER): Payer: Medicare Other

## 2021-02-15 ENCOUNTER — Other Ambulatory Visit: Payer: Self-pay

## 2021-02-15 DIAGNOSIS — M810 Age-related osteoporosis without current pathological fracture: Secondary | ICD-10-CM

## 2021-02-15 DIAGNOSIS — Z780 Asymptomatic menopausal state: Secondary | ICD-10-CM

## 2021-02-22 ENCOUNTER — Telehealth: Payer: Medicare Other

## 2021-02-24 ENCOUNTER — Other Ambulatory Visit: Payer: Self-pay

## 2021-02-24 DIAGNOSIS — E119 Type 2 diabetes mellitus without complications: Secondary | ICD-10-CM

## 2021-02-24 MED ORDER — ONETOUCH VERIO VI STRP: 1.0000 | ORAL_STRIP | Freq: Every day | 12 refills | Status: DC

## 2021-02-24 MED ORDER — ONETOUCH ULTRASOFT LANCETS MISC: 12 refills | Status: DC

## 2021-02-27 ENCOUNTER — Other Ambulatory Visit: Payer: Self-pay | Admitting: Family Medicine

## 2021-02-27 DIAGNOSIS — E7800 Pure hypercholesterolemia, unspecified: Secondary | ICD-10-CM

## 2021-03-01 ENCOUNTER — Telehealth: Payer: Self-pay | Admitting: Endocrinology

## 2021-03-01 ENCOUNTER — Ambulatory Visit
Admission: RE | Admit: 2021-03-01 | Discharge: 2021-03-01 | Disposition: A | Discharge status: Home / Self Care | Payer: Medicare Other | Source: Ambulatory Visit

## 2021-03-01 DIAGNOSIS — Z1231 Encounter for screening mammogram for malignant neoplasm of breast: Secondary | ICD-10-CM | POA: Diagnosis not present

## 2021-03-01 DIAGNOSIS — E119 Type 2 diabetes mellitus without complications: Secondary | ICD-10-CM

## 2021-03-01 MED ORDER — ONETOUCH ULTRASOFT LANCETS MISC: 12 refills | Status: DC

## 2021-03-01 MED ORDER — ONETOUCH VERIO VI STRP: ORAL_STRIP | 12 refills | Status: DC

## 2021-03-01 NOTE — Telephone Encounter (Signed)
Pharmacy called stating that the Medicare from must be compeleted for Medicare to cover cost for the Lancets and Test strips.  [facility] [address] [place], [place]. AT [place] Phone:  [phone]  Fax:  [phone]

## 2021-03-01 NOTE — Telephone Encounter (Signed)
Sig has now been adjusted for One Touch Ultrasoft lancets and One touch test strips.

## 2021-03-01 NOTE — Telephone Encounter (Signed)
Pharmacy called to advise that for the Test Strips & Lancets prescriptions they need the Medicare Part B form completed otherwise insurance will not pay.  Pharmacy has advised that patient has been out of both for 5 days.  Form sent by fax today for completion  [facility] [phone]

## 2021-03-02 ENCOUNTER — Encounter: Payer: Self-pay | Admitting: Family Medicine

## 2021-03-02 ENCOUNTER — Ambulatory Visit (INDEPENDENT_AMBULATORY_CARE_PROVIDER_SITE_OTHER): Payer: Medicare Other | Admitting: Family Medicine

## 2021-03-02 ENCOUNTER — Other Ambulatory Visit: Payer: Self-pay

## 2021-03-02 VITALS — BP 158/94 | HR 81 | Temp 96.8°F | Ht 63.0 in | Wt 168.4 lb

## 2021-03-02 DIAGNOSIS — E119 Type 2 diabetes mellitus without complications: Secondary | ICD-10-CM | POA: Diagnosis not present

## 2021-03-02 DIAGNOSIS — I10 Essential (primary) hypertension: Secondary | ICD-10-CM | POA: Diagnosis not present

## 2021-03-02 LAB — BASIC METABOLIC PANEL
BUN: 22 mg/dL (ref 6–23)
CO2: 29 mEq/L (ref 19–32)
Calcium: 9.8 mg/dL (ref 8.4–10.5)
Chloride: 103 mEq/L (ref 96–112)
Creatinine, Ser: 1.01 mg/dL (ref 0.40–1.20)
GFR: 57.57 mL/min — ABNORMAL LOW (ref >60.00)
Glucose, Bld: 171 mg/dL — ABNORMAL HIGH (ref 70–99)
Potassium: 4.3 mEq/L (ref 3.5–5.1)
Sodium: 139 mEq/L (ref 135–145)

## 2021-03-02 LAB — HEMOGLOBIN A1C: Hgb A1c MFr Bld: 8.4 % — ABNORMAL HIGH (ref 4.6–6.5)

## 2021-03-02 MED ORDER — OLMESARTAN MEDOXOMIL 20 MG PO TABS: 30.0000 mg | ORAL_TABLET | Freq: Every day | ORAL | 1 refills | Status: DC

## 2021-03-02 MED ORDER — METFORMIN HCL ER 500 MG PO TB24: 500.0000 mg | ORAL_TABLET | Freq: Two times a day (BID) | ORAL | 1 refills | Status: DC

## 2021-03-02 NOTE — Progress Notes (Signed)
Established Patient Office Visit  Subjective:  Patient ID: Alejandra Hess, female    DOB: 10/08/1955  Age: 66 y.o. MRN: [id number]  CC:  Chief Complaint  Patient presents with   Hypertension    Concerns about elevated BP and diabetes, would like to start back on Metformin. Check rash on inner thighs.     HPI Alejandra Hess presents for follow-up of diabetes and hypertension.  Blood pressure has been running in the 140s over 90s on average on 20 mg of olmesartan.  She is tolerating the drug well.  She denies headaches or blurred vision.  Blood sugars have been averaging in the 170 range through her One Touch meter.  She is not doing well with Starlix.  She would like to try metformin again.  She has taken this medication in the past but did not feel as though it was effective.  Past Medical History:  Diagnosis Date   Allergy    Asthma    DM (diabetes mellitus) (HCC)    GERD (gastroesophageal reflux disease)    Heart murmur    as a child   HLD (hyperlipidemia)    HSV-1 infection    Hypertension     Past Surgical History:  Procedure Laterality Date   41 HOUR [place] STUDY N/A 11/18/2019   Procedure: 24 HOUR PH STUDY;  Surgeon: Irving Copas., MD;  Location: [facility];  Service: Gastroenterology;  Laterality: N/A;   ABDOMINAL HYSTERECTOMY     AUGMENTATION MAMMAPLASTY Bilateral    @ 43 lift with implants   BREAST EXCISIONAL BIOPSY Right    @ 53   BREAST EXCISIONAL BIOPSY Left    @43 ?   CHOLECYSTECTOMY     COLONOSCOPY     ESOPHAGEAL MANOMETRY N/A 11/18/2019   Procedure: ESOPHAGEAL MANOMETRY (EM);  Surgeon: Irving Copas., MD;  Location: [facility];  Service: Gastroenterology;  Laterality: N/A;   UPPER GASTROINTESTINAL ENDOSCOPY      Family History  Problem Relation Age of Onset   Hearing loss Mother    Hyperlipidemia Mother    Hypertension Mother    Diabetes Mother    Heart attack Father    Heart disease Father     Hyperlipidemia Father    Hypertension Father    Melanoma Father    Asthma Sister    Hyperlipidemia Sister    Hypertension Sister    Hyperlipidemia Sister    Breast cancer Sister 60   Asthma Sister    Depression Sister    Heart attack Sister    Heart disease Sister    Hypertension Sister    Diabetes Sister    Breast cancer Maternal Aunt        over 74   Breast cancer Paternal Aunt        over 9   Breast cancer Paternal Grandmother        over 27    Breast cancer Maternal Aunt        over 40    Colon cancer Maternal Grandfather    Esophageal cancer Neg Hx    Inflammatory bowel disease Neg Hx    Liver disease Neg Hx    Pancreatic cancer Neg Hx    Rectal cancer Neg Hx    Stomach cancer Neg Hx     Social History   Socioeconomic History   Marital status: Married    Spouse name: Not on file   Number of children: 2   Years of education: Not on file   Highest  education level: Not on file  Occupational History   Occupation: RN  Tobacco Use   Smoking status: Former    Types: Cigarettes    Quit date: 2013    Years since quitting: 10.0   Smokeless tobacco: Never  Vaping Use   Vaping Use: Never used  Substance and Sexual Activity   Alcohol use: Yes    Comment: 2 glasses of red wine daily   Drug use: Never   Sexual activity: Yes    Partners: Male    Birth control/protection: Surgical    Comment: 1st intercourse- 15, partners- 5-, MARRIED- 53, hysterectomy  Other Topics Concern   Not on file  Social History Narrative   Not on file   Social Determinants of Health   Financial Resource Strain: Low Risk    Difficulty of Paying Living Expenses: Not hard at all  Food Insecurity: No Food Insecurity   Worried About Charity fundraiser in the Last Year: Never true   [place] in the Last Year: Never true  Transportation Needs: No Transportation Needs   Lack of Transportation (Medical): No   Lack of Transportation  (Non-Medical): No  Physical Activity: Inactive   Days of Exercise per Week: 0 days   Minutes of Exercise per Session: 0 min  Stress: No Stress Concern Present   Feeling of Stress : Not at all  Social Connections: Moderately Integrated   Frequency of Communication with Friends and Family: Twice a week   Frequency of Social Gatherings with Friends and Family: Twice a week   Attends Religious Services: Never   Marine scientist or Organizations: Yes   Attends Music therapist: More than 4 times per year   Marital Status: Married  Human resources officer Violence: Not At Risk   Fear of Current or Ex-Partner: No   Emotionally Abused: No   Physically Abused: No   Sexually Abused: No    Outpatient Medications Prior to Visit  Medication Sig Dispense Refill   albuterol (PROVENTIL) (2.5 MG/3ML) 0.083% nebulizer solution PRN  7   albuterol (VENTOLIN HFA) 108 (90 Base) MCG/ACT inhaler Inhale into the lungs. PRN     budesonide-formoterol (SYMBICORT) 160-4.5 MCG/ACT inhaler Inhale 1 puff into the lungs at bedtime as needed. 1 Inhaler 3   cetirizine (ZYRTEC) 10 MG tablet Take 10 mg by mouth daily.     glucose blood (ONETOUCH VERIO) test strip Check blood sugar before 3 meals and at bedtime. 100 each 12   hydroxychloroquine (PLAQUENIL) 200 MG tablet Take 200 mg by mouth 2 (two) times daily.     Lancets (ONETOUCH ULTRASOFT) lancets Use 1 lancet a day to check blood sugars 100 each 12   magnesium oxide (MAG-OX) 400 MG tablet Take 400 mg by mouth daily.      Multiple Vitamin (MULTIVITAMIN) tablet Take 1 tablet by mouth daily.     nateglinide (STARLIX) 120 MG tablet Take 1 tablet (120 mg total) by mouth 2 (two) times daily with a meal. 180 tablet 3   pantoprazole (PROTONIX) 40 MG tablet TAKE 1 TABLET BY MOUTH DAILY 90 tablet 1   Probiotic Product (PROBIOTIC PO) Take 1 capsule by mouth 2 (two) times daily.      rosuvastatin (CRESTOR) 20 MG tablet Take 1 tablet (20 mg  total) by mouth daily. 90 tablet 3   triamcinolone (NASACORT) 55 MCG/ACT AERO nasal inhaler Place 1 spray into the nose daily.     triamcinolone cream (KENALOG) 0.1 % SMARTSIG:1  Application Topical 2-3 Times Daily     valACYclovir (VALTREX) 1000 MG tablet Take 1 tablet (1,000 mg total) by mouth 3 (three) times daily. 21 tablet 0   venlafaxine (EFFEXOR) 50 MG tablet Take 1 tablet (50 mg total) by mouth daily. 90 tablet 0   olmesartan (BENICAR) 20 MG tablet TAKE 1 TABLET(20 MG) BY MOUTH DAILY 90 tablet 0   prednisoLONE acetate (PRED FORTE) 1 % ophthalmic suspension One drop in each ear ever 6 hours as needed for itch. 5 mL 1   No facility-administered medications prior to visit.    Allergies  Allergen Reactions   Azithromycin Rash   Penicillins Hives and Rash    ROS Review of Systems  Constitutional:  Negative for chills, diaphoresis, fatigue, fever and unexpected weight change.  HENT: Negative.    Eyes:  Negative for photophobia and visual disturbance.  Respiratory: Negative.    Cardiovascular: Negative.   Gastrointestinal: Negative.   Endocrine: Negative for polyphagia and polyuria.  Genitourinary: Negative.   Neurological:  Negative for light-headedness and headaches.     Objective:    Physical Exam Vitals and nursing note reviewed.  Constitutional:      General: She is not in acute distress.    Appearance: Normal appearance. She is not ill-appearing, toxic-appearing or diaphoretic.  HENT:     Head: Normocephalic and atraumatic.     Right Ear: Tympanic membrane, ear canal and external ear normal.     Left Ear: Tympanic membrane, ear canal and external ear normal.     Mouth/Throat:     Mouth: Mucous membranes are moist.     Pharynx: Oropharynx is clear. No oropharyngeal exudate or posterior oropharyngeal erythema.  Eyes:     General: No scleral icterus.       Right eye: No discharge.        Left eye: No discharge.     Extraocular Movements: Extraocular  movements intact.     Conjunctiva/sclera: Conjunctivae normal.     Pupils: Pupils are equal, round, and reactive to light.  Neck:     Vascular: No carotid bruit.  Cardiovascular:     Rate and Rhythm: Normal rate and regular rhythm.  Pulmonary:     Effort: Pulmonary effort is normal.     Breath sounds: Normal breath sounds.  Abdominal:     General: Bowel sounds are normal.  Musculoskeletal:     Cervical back: No rigidity or tenderness.  Lymphadenopathy:     Cervical: No cervical adenopathy.  Skin:    General: Skin is warm and dry.  Neurological:     Mental Status: She is alert and oriented to person, place, and time.  Psychiatric:        Mood and Affect: Mood normal.        Behavior: Behavior normal.    BP (!) 158/94 (BP Location: Left Arm, Patient Position: Sitting, Cuff Size: Normal)    Pulse 81    Temp (!) 96.8 F (36 C) (Temporal)    Ht 5\' 3"  (1.6 m)    Wt 168 lb 6.4 oz (76.4 kg)    SpO2 95%    BMI 29.83 kg/m  Wt Readings from Last 3 Encounters:  03/02/21 168 lb 6.4 oz (76.4 kg)  02/13/21 167 lb (75.8 kg)  02/01/21 169 lb (76.7 kg)     Health Maintenance Due  Topic Date Due   TETANUS/TDAP  Never done   Zoster Vaccines- Shingrix (1 of 2) Never done    There are  no preventive care reminders to display for this patient.  Lab Results  Component Value Date   TSH 1.53 01/20/2019   Lab Results  Component Value Date   WBC 6.1 07/01/2020   HGB 14.0 07/01/2020   HCT 41.3 07/01/2020   MCV 90.9 07/01/2020   PLT 278.0 07/01/2020   Lab Results  Component Value Date   NA 139 07/01/2020   K 3.9 07/01/2020   CO2 27 07/01/2020   GLUCOSE 159 (H) 07/01/2020   BUN 12 07/01/2020   CREATININE 0.70 07/01/2020   BILITOT 0.5 07/01/2020   ALKPHOS 75 07/01/2020   AST 28 07/01/2020   ALT 50 (H) 07/01/2020   PROT 7.3 07/01/2020   ALBUMIN 4.6 07/01/2020   CALCIUM 9.8 07/01/2020   GFR 89.80 07/01/2020   Lab Results  Component Value Date   CHOL 164 12/09/2019   Lab  Results  Component Value Date   HDL 57.10 12/09/2019   Lab Results  Component Value Date   LDLCALC 90 12/09/2019   Lab Results  Component Value Date   TRIG 87.0 12/09/2019   Lab Results  Component Value Date   CHOLHDL 3 12/09/2019   Lab Results  Component Value Date   HGBA1C 7.6 (A) 12/19/2020      Assessment & Plan:   Problem List Items Addressed This Visit       Cardiovascular and Mediastinum   Essential hypertension   Relevant Medications   olmesartan (BENICAR) 20 MG tablet   Other Relevant Orders   Basic metabolic panel     Endocrine   Controlled type 2 diabetes mellitus without complication, without long-term current use of insulin (HCC) - Primary   Relevant Medications   metFORMIN (GLUCOPHAGE XR) 500 MG 24 hr tablet   olmesartan (BENICAR) 20 MG tablet   Other Relevant Orders   Basic metabolic panel   Hemoglobin A1c    Meds ordered this encounter  Medications   metFORMIN (GLUCOPHAGE XR) 500 MG 24 hr tablet    Sig: Take 1 tablet (500 mg total) by mouth 2 (two) times daily before a meal.    Dispense:  180 tablet    Refill:  1   olmesartan (BENICAR) 20 MG tablet    Sig: Take 1.5 tablets (30 mg total) by mouth daily.    Dispense:  135 tablet    Refill:  1    Follow-up: Return in about 3 months (around 05/31/2021).  Have increased olmesartan to 30 mg.  Will restart Glucophage Exar 500 twice daily.  [name], MD

## 2021-03-03 ENCOUNTER — Ambulatory Visit: Payer: Medicare Other | Admitting: Obstetrics & Gynecology

## 2021-03-07 ENCOUNTER — Telehealth: Payer: Self-pay | Admitting: Family Medicine

## 2021-03-07 NOTE — Telephone Encounter (Signed)
Returned pharmacist call

## 2021-03-08 ENCOUNTER — Encounter: Payer: Self-pay | Admitting: Family Medicine

## 2021-03-08 ENCOUNTER — Telehealth: Payer: Self-pay

## 2021-03-08 NOTE — Telephone Encounter (Signed)
Patient requesting a referral to see a dietitian, states that she needs to figure out how to eat healthier so she can lose weight. Okay for referral? Please advise.

## 2021-03-09 ENCOUNTER — Ambulatory Visit: Payer: Medicare Other | Admitting: Gastroenterology

## 2021-03-09 NOTE — Addendum Note (Signed)
Addended by: Lynda Rainwater on: 03/09/2021 10:14 AM   Modules accepted: Orders

## 2021-03-09 NOTE — Telephone Encounter (Signed)
Referral placed.

## 2021-03-22 ENCOUNTER — Encounter: Payer: Self-pay | Admitting: Gastroenterology

## 2021-03-22 ENCOUNTER — Other Ambulatory Visit: Payer: Self-pay

## 2021-03-22 ENCOUNTER — Ambulatory Visit (INDEPENDENT_AMBULATORY_CARE_PROVIDER_SITE_OTHER): Payer: Medicare Other | Admitting: Gastroenterology

## 2021-03-22 VITALS — BP 158/92 | HR 81 | Ht 63.0 in | Wt 169.5 lb

## 2021-03-22 DIAGNOSIS — Z86010 Personal history of colonic polyps: Secondary | ICD-10-CM | POA: Diagnosis not present

## 2021-03-22 DIAGNOSIS — R12 Heartburn: Secondary | ICD-10-CM

## 2021-03-22 MED ORDER — VENLAFAXINE HCL ER 37.5 MG PO CP24: ORAL_CAPSULE | ORAL | 0 refills | Status: DC

## 2021-03-22 MED ORDER — VENLAFAXINE HCL ER 75 MG PO CP24: 75.0000 mg | ORAL_CAPSULE | Freq: Every day | ORAL | 5 refills | Status: DC

## 2021-03-22 NOTE — Progress Notes (Signed)
Chief Complaint:    Colon polyp surveillance, Functional HB  GI History: Alejandra Hess is a 66 year old female with a history of diabetes, HTN, HLD, asthma, initially referred to me in 10/2019 by Dr. Rush Landmark for evaluation of possible antireflux intervention with Transoral Incisionless Fundoplication (TIF) with a goal to stop or significantly reduce acid suppression therapy.  Was subsequently diagnosed with functional heartburn, so antireflux surgery not indicated.  Was started on Effexor with good clinical response.    GERD history: -Index symptoms: HB, sinus congestion, sore throat, nocturnal cough -Exacerbating features: Nocturnal (cough, HB), post prandial -Medications trialed: Dexilant, Domperidone, omeprazole, Zantac, Pepcid, Carafate -Current medications: Effexor 50 mg/day, Protonix 40 mg QAM; HOB elevation -Complications: None   GERD evaluation: -Last EGD: 07/2019 -Barium esophagram: None -Esophageal Manometry:  10/2019: Normal -pH/Impedance:  10/2019: [place] 11, pH <4 of 3.7%, normal impedance, negative symptom correlation by SAP c/w Functional heartburn.  Started Effexor  -Bravo: N/A   Endoscopic History: - Colonoscopy (04/2015, [place], [place]): 3-5 mm sessile polyp at AO (path: Sessile serrated adenoma), 3-5 mm polyp in sigmoid (path: HP). -EGD (05/2019, Dr. Rush Landmark): Schatzki's ring, small <2 cm hiatal hernia, normal Z-line, gastric polyps    HPI:     Patient is a 66 y.o. female presenting to the [facility] for follow-up.  Last seen by me on 01/14/2020.  Main issue at that time was treatment of functional heartburn, which is well controlled since starting Effexor at that time.  Effexor 50 mg is less efficacious over last several months. Takes QAM, but breakthrough at night (cough). Still taking Protonix 40 mg/day. She tried QOD but had breakthrough so went back to daily dosing.  No dysphagia.  She would like to discuss repeat colonoscopy for ongoing  colon cancer screening/polyp surveillance.  Last colonoscopy was 2017 and notable for sessile serrated adenoma at the appendiceal orifice, with 5-year recall. She would like to postpone for 6 months due to recently starting Plaquenil for a post-COVID rash.   She otherwise feels in her usual state of health.  Good p.o. intake.  Weight stable.  No lower GI symptoms.   Review of systems:     No chest pain, no SOB, no fevers, no urinary sx   Past Medical History:  Diagnosis Date   Allergy    Asthma    COVID-19    DM (diabetes mellitus) (HCC)    GERD (gastroesophageal reflux disease)    Heart murmur    as a child   HLD (hyperlipidemia)    HSV-1 infection    Hypertension     Patient's surgical history, family medical history, social history, medications and allergies were all reviewed in Epic    Current Outpatient Medications  Medication Sig Dispense Refill   albuterol (PROVENTIL) (2.5 MG/3ML) 0.083% nebulizer solution PRN  7   albuterol (VENTOLIN HFA) 108 (90 Base) MCG/ACT inhaler Inhale into the lungs. PRN     budesonide-formoterol (SYMBICORT) 160-4.5 MCG/ACT inhaler Inhale 1 puff into the lungs at bedtime as needed. 1 Inhaler 3   cetirizine (ZYRTEC) 10 MG tablet Take 10 mg by mouth daily.     glucose blood (ONETOUCH VERIO) test strip Check blood sugar before 3 meals and at bedtime. 100 each 12   hydroxychloroquine (PLAQUENIL) 200 MG tablet Take 200 mg by mouth 2 (two) times daily.     Lancets (ONETOUCH ULTRASOFT) lancets Use 1 lancet a day to check blood sugars 100 each 12   magnesium oxide (MAG-OX) 400 MG tablet Take 400  mg by mouth as needed.     metFORMIN (GLUCOPHAGE XR) 500 MG 24 hr tablet Take 1 tablet (500 mg total) by mouth 2 (two) times daily before a meal. 180 tablet 1   Multiple Vitamin (MULTIVITAMIN) tablet Take 1 tablet by mouth daily.     olmesartan (BENICAR) 20 MG tablet Take 1.5 tablets (30 mg total) by mouth daily. 135 tablet 1   pantoprazole (PROTONIX) 40 MG tablet  TAKE 1 TABLET BY MOUTH DAILY 90 tablet 1   Probiotic Product (PROBIOTIC PO) Take 1 capsule by mouth 2 (two) times daily.      rosuvastatin (CRESTOR) 20 MG tablet Take 1 tablet (20 mg total) by mouth daily. 90 tablet 3   triamcinolone (NASACORT) 55 MCG/ACT AERO nasal inhaler Place 1 spray into the nose as needed.     triamcinolone cream (KENALOG) 0.1 % SMARTSIG:1 Application Topical 2-3 Times Daily     valACYclovir (VALTREX) 1000 MG tablet Take 1 tablet (1,000 mg total) by mouth 3 (three) times daily. (Patient taking differently: Take 1,000 mg by mouth as needed.) 21 tablet 0   venlafaxine (EFFEXOR) 50 MG tablet Take 1 tablet (50 mg total) by mouth daily. 90 tablet 0   No current facility-administered medications for this visit.    Physical Exam:     BP (!) 158/92 (BP Location: Left Arm, Patient Position: Sitting, Cuff Size: Normal)    Pulse 81    Ht 5\' 3"  (1.6 m)    Wt 169 lb 8 oz (76.9 kg)    BMI 30.03 kg/m   GENERAL:  Pleasant female in NAD PSYCH: : Cooperative, normal affect CARDIAC:  RRR, no murmur heard, no peripheral edema PULM: Normal respiratory effort, lungs CTA bilaterally, no wheezing ABDOMEN:  Nondistended, soft, nontender. No obvious masses, no hepatomegaly,  normal bowel sounds SKIN:  turgor, no lesions seen Musculoskeletal:  Normal muscle tone, normal strength NEURO: Alert and oriented x 3, no focal neurologic deficits   IMPRESSION and PLAN:    1) Functional Heartburn - Change to Effexor ER 75 mg.  Start at 37.5 mg/day x4 days, then increase to 75 mg/day - Stop Effexor 50 mg/day tablets when starting ER tabs - Resume Protonix 40 mg/day.  Can again try to wean off after starting new Effexor as above  2) History of colon polyps - Due for repeat colonoscopy for ongoing polyp surveillance - Per patient request, she would like to delay scheduling for 47-month as she recently started Plaquenil and not sure she could tolerate a bowel prep.  Will place 3-month recall  The  indications, risks, and benefits of colonoscopy were explained to the patient in detail. Risks include but are not limited to bleeding, perforation, adverse reaction to medications, and cardiopulmonary compromise. Sequelae include but are not limited to the possibility of surgery, hospitalization, and mortality. The patient verbalized understanding and wished to proceed. All questions answered.  Further recommendations pending results of the exam.            Alejandra Hess ,DO, FACG 03/22/2021, 9:22 AM

## 2021-03-22 NOTE — Patient Instructions (Addendum)
If you are age 68 or older, your body mass index should be between 23-30. Your Body mass index is 30.03 kg/m. If this is out of the aforementioned range listed, please consider follow up with your Primary Care Provider.  If you are age 54 or younger, your body mass index should be between 19-25. Your Body mass index is 30.03 kg/m. If this is out of the aformentioned range listed, please consider follow up with your Primary Care Provider.   __________________________________________________________  The [facility] providers would like to encourage you to use [facility] to communicate with providers for non-urgent requests or questions.  Due to long hold times on the telephone, sending your provider a message by [facility] may be a faster and more efficient way to get a response.  Please allow 48 business hours for a response.  Please remember that this is for non-urgent requests.    We have sent the following medications to your pharmacy for you to pick up at your convenience:  Start taking effexor 37.5mg  for 4 days then take 75 mg daily.  We have placed a recall reminder to schedule your colonoscopy in 6 months which will be in July  Thank you for choosing me and [facility].  Vito Cirigliano, D.O.

## 2021-03-29 ENCOUNTER — Telehealth: Payer: Medicare Other

## 2021-04-12 ENCOUNTER — Ambulatory Visit (INDEPENDENT_AMBULATORY_CARE_PROVIDER_SITE_OTHER): Payer: Medicare Other

## 2021-04-12 DIAGNOSIS — I10 Essential (primary) hypertension: Secondary | ICD-10-CM

## 2021-04-12 DIAGNOSIS — E119 Type 2 diabetes mellitus without complications: Secondary | ICD-10-CM

## 2021-04-12 NOTE — Patient Instructions (Signed)
Visit Information  Thank you for taking time to visit with me today. Please don't hesitate to contact me if I can be of assistance to you before our next scheduled telephone appointment.  Following are the goals we discussed today:  - continue to check blood sugar at prescribed times and enter blood sugar readings into log. Take the blood sugar log / meter to all doctor visitsily log.   - Check blood sugar if you feel it is too high or too low - Continue to follow a low carbohydrate and low salt meals, watch portion sizes and avoid sugar sweetened drinks - Continue to check blood pressure 1-2 times per week and write blood pressure results in a log or diary(report blood pressure readings to your provider) - Continue to eat low salt heart healthy meals full of fruits vegetables, whole grains, lean protein, and limit fat and sugars.  RULE OF 15:   How to treat low blood sugars (Blood sugar less than 70 mg/dl  Please follow the RULE OF 15 for the treatment of hypoglycemia treatment (When your blood sugars are less than 70 mg/ dl) STEP  1:  Take 15 grams of carbohydrates when your blood sugar is low, which includes:   3-4 glucose tabs or  3-4 oz of juice or regular soda or  One tube of glucose gel STEP 2:  Recheck blood sugar in 15 minutes STEP 3:  If your blood sugar is still low at the 15 minute recheck ---then, go back to STEP 1 and treat again with another 15 grams of carbohydrates  Our next appointment is by telephone on 05/22/21 at 11:00 am  Please call the care guide team at [phone] if you need to cancel or reschedule your appointment.   If you are experiencing a Mental Health or [place] or need someone to talk to, please call the Suicide and Crisis Lifeline: 988 call 1-800-273-TALK (toll free, 24 hour hotline)   Patient verbalizes understanding of instructions and care plan provided today and agrees to view in [place]. Active MyChart status confirmed with patient.     Quinn Plowman RN,BSN,CCM RN Case Manager [place]  [phone]

## 2021-04-12 NOTE — Chronic Care Management (AMB) (Signed)
Chronic Care Management   CCM RN Visit Note  04/12/2021 Name: Alejandra Hess MRN: [id number] DOB: [birth year]  Subjective: Alejandra Hess is a 66 y.o. year old female who is a primary care patient of [name], MD. The care management team was consulted for assistance with disease management and care coordination needs.    Engaged with patient by telephone for follow up visit in response to provider referral for case management and/or care coordination services.   Consent to Services:  The patient was given information about Chronic Care Management services, agreed to services, and gave verbal consent prior to initiation of services.  Please see initial visit note for detailed documentation.   Patient agreed to services and verbal consent obtained.   Assessment: Review of patient past medical history, allergies, medications, health status, including review of consultants reports, laboratory and other test data, was performed as part of comprehensive evaluation and provision of chronic care management services.   SDOH (Social Determinants of Health) assessments and interventions performed:    CCM Care Plan  Allergies  Allergen Reactions   Azithromycin Rash   Penicillins Hives and Rash    Outpatient Encounter Medications as of 04/12/2021  Medication Sig Note   albuterol (PROVENTIL) (2.5 MG/3ML) 0.083% nebulizer solution PRN    albuterol (VENTOLIN HFA) 108 (90 Base) MCG/ACT inhaler Inhale into the lungs. PRN    budesonide-formoterol (SYMBICORT) 160-4.5 MCG/ACT inhaler Inhale 1 puff into the lungs at bedtime as needed.    cetirizine (ZYRTEC) 10 MG tablet Take 10 mg by mouth daily.    hydroxychloroquine (PLAQUENIL) 200 MG tablet Take 200 mg by mouth 2 (two) times daily.    magnesium oxide (MAG-OX) 400 MG tablet Take 400 mg by mouth as needed.    metFORMIN (GLUCOPHAGE XR) 500 MG 24 hr tablet Take 1 tablet (500 mg total) by mouth 2 (two) times daily before a meal.     Multiple Vitamin (MULTIVITAMIN) tablet Take 1 tablet by mouth daily.    pantoprazole (PROTONIX) 40 MG tablet TAKE 1 TABLET BY MOUTH DAILY    Probiotic Product (PROBIOTIC PO) Take 1 capsule by mouth 2 (two) times daily.  09/21/2020: Patient states she takes 1 per day.    rosuvastatin (CRESTOR) 20 MG tablet Take 1 tablet (20 mg total) by mouth daily.    triamcinolone (NASACORT) 55 MCG/ACT AERO nasal inhaler Place 1 spray into the nose as needed. 09/21/2020: Patient states she uses as needed.    triamcinolone cream (KENALOG) 0.1 % SMARTSIG:1 Application Topical 2-3 Times Daily    valACYclovir (VALTREX) 1000 MG tablet Take 1 tablet (1,000 mg total) by mouth 3 (three) times daily. (Patient taking differently: Take 1,000 mg by mouth as needed.) 09/21/2020: As needed   venlafaxine XR (EFFEXOR XR) 37.5 MG 24 hr capsule Take 1 capsule (37.5 mg total) by mouth daily with breakfast for 4 days, THEN 2 capsules (75 mg total) daily with breakfast. 04/12/2021: Patient states she is taking 50 mg time release 1 time per day.    glucose blood (ONETOUCH VERIO) test strip Check blood sugar before 3 meals and at bedtime.    Lancets (ONETOUCH ULTRASOFT) lancets Use 1 lancet a day to check blood sugars    olmesartan (BENICAR) 20 MG tablet Take 1.5 tablets (30 mg total) by mouth daily. 04/12/2021: Patient states she is taking 30 mg daily   [START ON 04/25/2021] venlafaxine XR (EFFEXOR XR) 75 MG 24 hr capsule Take 1 capsule (75 mg total) by mouth daily with breakfast.  No facility-administered encounter medications on file as of 04/12/2021.    Patient Active Problem List   Diagnosis Date Noted   Ceruminosis, bilateral 10/27/2020   Essential hypertension 10/27/2020   Chronic eczematous otitis externa of both ears 10/27/2020   Acute non-recurrent maxillary sinusitis 09/22/2020   Adrenal nodule (HCC) 09/20/2020   Chronic cough    Heartburn    Asthma 10/08/2019   Hiatal hernia 08/16/2019   Schatzki's ring 08/16/2019    Gastritis 08/16/2019   History of colonic polyps 08/16/2019   Vasculitis ([place]) 05/04/2019   History of COVID-19 04/03/2019   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/03/2019   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   Chronic fatigue 01/20/2019   Hot flashes 01/20/2019   COVID-19 virus infection 12/12/2018   Viral syndrome 12/08/2018   Reactive airway disease 12/08/2018   Glaucoma 11/24/2018   Gastroesophageal reflux disease 11/21/2018   H/O esophagitis 11/21/2018   Elevated LDL cholesterol level 07/04/2018   Controlled type 2 diabetes mellitus without complication, without long-term current use of insulin ([place]) 07/04/2018   Healthcare maintenance 01/27/2018   Asthmatic bronchitis 08/19/2017    Conditions to be addressed/monitored:HTN and DMII   Care Plan : [facility] plan of care  Updates made by Dannielle Karvonen, RN since 04/12/2021 12:00 AM   Problem: Chronic disease management education and care coordination needs   Priority: High   Long-Range Goal: Development of plan of care to address chronic disease managment and care coordination needs.   Start Date: 04/12/2021  Expected End Date: 06/23/2021  Priority: High  Current Barriers:  Knowledge Deficits related to plan of care for management of HTN and DMII  Patient states her primary care provider increased her blood pressure medication at last visit.  Patient reports most recent blood pressure results on increased medication dosage are:  143/79, 144/85, 140/73, 153/91.  She reports next follow up with primary care provider visit is 05/25/2021.     Patient states she is monitoring her blood sugars fasting and after lunch.  She reports her blood sugar average has been 154.  She reports today's fasting blood sugar was 157.   Patient states she notices her blood sugar is higher in the am and lower after lunch.  She states she is scheduled to see the dietician on 04/27/21.   Patient states she continues to stay active with walking and golf.   RNCM Clinical Goal(s):  Patient will  verbalize understanding of plan for management of HTN and DMII as evidenced by patient self report and/ or notation in chart.  take all medications exactly as prescribed and will call provider for medication related questions as evidenced by patient self report and/ or notation in chart.    attend all scheduled medical appointments:   as evidenced by patient self report and/ or notation in chart        continue to work with RN Care Manager and/or Social Worker to address care management and care coordination needs related to HTN and DMII as evidenced by adherence to CM Team Scheduled appointments     through collaboration with RN Care manager, provider, and care team.   Interventions: 1:1 collaboration with primary care provider regarding development and update of comprehensive plan of care as evidenced by provider attestation and co-signature Inter-disciplinary care team collaboration (see longitudinal plan of care) Evaluation of current treatment plan related to  self management and patient's adherence to plan as established by provider   Diabetes Interventions:  (Status:  Goal on track:  Yes.) Long Term  Goal Assessed patient's understanding of A1c goal:  8.4 Reviewed medications with patient and discussed importance of medication adherence Discussed plans with patient for ongoing care management follow up and provided patient with direct contact information for care management team Reviewed scheduled/upcoming provider appointments  Lab Results  Component Value Date   HGBA1C 8.4 (H) 03/02/2021   Hypertension Interventions:  (Status:  Goal on track:  Yes.) Long Term Goal Last practice recorded BP readings:  BP Readings from Last 3 Encounters:  03/22/21 (!) 158/92  03/02/21 (!) 158/94  02/13/21 (!) 152/92  Most recent eGFR/CrCl: No results found for: EGFR  No components found for: CRCL  Evaluation of current treatment plan related to hypertension self management and patient's adherence to  plan as established by provider Reviewed medications with patient and discussed importance of compliance Discussed plans with patient for ongoing care management follow up and provided patient with direct contact information for care management team Advised patient to continue to  monitor blood pressure daily and record, calling PCP for findings outside established parameters Reviewed scheduled/upcoming provider appointments including:    Patient Goals/Self-Care Activities: - continue to check blood sugar at prescribed times and enter blood sugar readings into log. Take the blood sugar log / meter to all doctor visitsily log.   - Check blood sugar if you feel it is too high or too low - Continue to follow a low carbohydrate and low salt meals, watch portion sizes and avoid sugar sweetened drinks - Continue to check blood pressure 1-2 times per week and write blood pressure results in a log or diary(report blood pressure readings to your provider) - Continue to eat low salt heart healthy meals full of fruits vegetables, whole grains, lean protein, and limit fat and sugars.  RULE OF 15:   How to treat low blood sugars (Blood sugar less than 70 mg/dl  Please follow the RULE OF 15 for the treatment of hypoglycemia treatment (When your blood sugars are less than 70 mg/ dl) STEP  1:  Take 15 grams of carbohydrates when your blood sugar is low, which includes:   3-4 glucose tabs or  3-4 oz of juice or regular soda or  One tube of glucose gel STEP 2:  Recheck blood sugar in 15 minutes STEP 3:  If your blood sugar is still low at the 15 minute recheck ---then, go back to STEP 1 and treat again with another 15 grams of carbohydrates       Plan:The patient has been provided with contact information for the care management team and has been advised to call with any health related questions or concerns.  The care management team will reach out to the patient again over the next 2 months. Quinn Plowman  RN,BSN,CCM RN Case Manager [place]  [phone]

## 2021-04-25 DIAGNOSIS — I10 Essential (primary) hypertension: Secondary | ICD-10-CM | POA: Diagnosis not present

## 2021-04-25 DIAGNOSIS — E119 Type 2 diabetes mellitus without complications: Secondary | ICD-10-CM | POA: Diagnosis not present

## 2021-04-27 ENCOUNTER — Ambulatory Visit: Payer: Medicare Other | Admitting: Dietician

## 2021-04-28 ENCOUNTER — Ambulatory Visit: Payer: Medicare Other | Admitting: Endocrinology

## 2021-04-28 ENCOUNTER — Other Ambulatory Visit: Payer: Self-pay | Admitting: Family Medicine

## 2021-04-28 DIAGNOSIS — E7800 Pure hypercholesterolemia, unspecified: Secondary | ICD-10-CM

## 2021-05-19 DIAGNOSIS — H6502 Acute serous otitis media, left ear: Secondary | ICD-10-CM | POA: Diagnosis not present

## 2021-05-19 DIAGNOSIS — H60502 Unspecified acute noninfective otitis externa, left ear: Secondary | ICD-10-CM | POA: Diagnosis not present

## 2021-05-22 ENCOUNTER — Ambulatory Visit (INDEPENDENT_AMBULATORY_CARE_PROVIDER_SITE_OTHER): Payer: Medicare Other

## 2021-05-22 DIAGNOSIS — I10 Essential (primary) hypertension: Secondary | ICD-10-CM

## 2021-05-22 DIAGNOSIS — E119 Type 2 diabetes mellitus without complications: Secondary | ICD-10-CM

## 2021-05-22 NOTE — Patient Instructions (Signed)
Visit Information ? ?Thank you for taking time to visit with me today. Please don't hesitate to contact me if I can be of assistance to you before our next scheduled telephone appointment. ? ?Following are the goals we discussed today:  ?- continue to check blood sugar at prescribed times and enter blood sugar readings into log. Take the blood sugar log / meter to all doctor visitsily log.   ?- Continue to follow a low carbohydrate and low salt meals, watch portion sizes and avoid sugar sweetened drinks ?- Continue to check blood pressure 1-2 times per week and write blood pressure results in a log or diary(report blood pressure readings to your provider) ?- Continue to eat low salt heart healthy meals full of fruits vegetables, whole grains, lean protein, and limit fat and sugars.  ?RULE OF 15:   How to treat low blood sugars (Blood sugar less than 70 mg/dl ? Please follow the RULE OF 15 for the treatment of hypoglycemia treatment (When your blood sugars are less than 70 mg/ dl) ?STEP  1:  Take 15 grams of carbohydrates when your blood sugar is low, which includes:  ? 3-4 glucose tabs or ? 3-4 oz of juice or regular soda or ? One tube of glucose gel ?STEP 2:  Recheck blood sugar in 15 minutes ?STEP 3:  If your blood sugar is still low at the 15 minute recheck ---then, go back to STEP 1 and treat again with another 15 grams of carbohydrates ? ?Our next appointment is by telephone on 06/30/21 at 11:00 am ? ?Please call the care guide team at [phone] if you need to cancel or reschedule your appointment.  ? ?If you are experiencing a Mental Health or [place] or need someone to talk to, please call the Suicide and Crisis Lifeline: 988 ?call 1-800-273-TALK (toll free, 24 hour hotline)  ? ?Patient verbalizes understanding of instructions and care plan provided today and agrees to view in [place]. Active MyChart status confirmed with patient.   ? ?Quinn Plowman RN,BSN,CCM ?RN Case Manager ?[place]  ?[phone] ? ?

## 2021-05-22 NOTE — Chronic Care Management (AMB) (Signed)
?Chronic Care Management  ? ?CCM RN Visit Note ? ?05/22/2021 ?Name: Alejandra Hess MRN: [id number] DOB: February 16, 1956 ? ?Subjective: ?Alejandra Hess is a 66 y.o. year old female who is a primary care patient of [name], MD. The care management team was consulted for assistance with disease management and care coordination needs.   ? ?Engaged with patient by telephone for follow up visit in response to provider referral for case management and/or care coordination services.  ? ?Consent to Services:  ?The patient was given information about Chronic Care Management services, agreed to services, and gave verbal consent prior to initiation of services.  Please see initial visit note for detailed documentation.  ? ?Patient agreed to services and verbal consent obtained.  ? ?Assessment: Review of patient past medical history, allergies, medications, health status, including review of consultants reports, laboratory and other test data, was performed as part of comprehensive evaluation and provision of chronic care management services.  ? ?SDOH (Social Determinants of Health) assessments and interventions performed:   ? ?CCM Care Plan ? ?Allergies  ?Allergen Reactions  ? Azithromycin Rash  ? Penicillins Hives and Rash  ? ? ?Outpatient Encounter Medications as of 05/22/2021  ?Medication Sig Note  ? albuterol (PROVENTIL) (2.5 MG/3ML) 0.083% nebulizer solution PRN   ? albuterol (VENTOLIN HFA) 108 (90 Base) MCG/ACT inhaler Inhale into the lungs. PRN   ? budesonide-formoterol (SYMBICORT) 160-4.5 MCG/ACT inhaler Inhale 1 puff into the lungs at bedtime as needed.   ? cetirizine (ZYRTEC) 10 MG tablet Take 10 mg by mouth daily.   ? glucose blood (ONETOUCH VERIO) test strip Check blood sugar before 3 meals and at bedtime.   ? hydroxychloroquine (PLAQUENIL) 200 MG tablet Take 200 mg by mouth 2 (two) times daily.   ? Lancets (ONETOUCH ULTRASOFT) lancets Use 1 lancet a day to check blood sugars   ? magnesium oxide (MAG-OX)  400 MG tablet Take 400 mg by mouth as needed.   ? metFORMIN (GLUCOPHAGE XR) 500 MG 24 hr tablet Take 1 tablet (500 mg total) by mouth 2 (two) times daily before a meal.   ? Multiple Vitamin (MULTIVITAMIN) tablet Take 1 tablet by mouth daily.   ? olmesartan (BENICAR) 20 MG tablet Take 1.5 tablets (30 mg total) by mouth daily. 04/12/2021: Patient states she is taking 30 mg daily  ? pantoprazole (PROTONIX) 40 MG tablet TAKE 1 TABLET BY MOUTH DAILY   ? Probiotic Product (PROBIOTIC PO) Take 1 capsule by mouth 2 (two) times daily.  09/21/2020: Patient states she takes 1 per day.   ? rosuvastatin (CRESTOR) 20 MG tablet TAKE 1 TABLET(20 MG) BY MOUTH DAILY.   ? triamcinolone (NASACORT) 55 MCG/ACT AERO nasal inhaler Place 1 spray into the nose as needed. 09/21/2020: Patient states she uses as needed.   ? triamcinolone cream (KENALOG) 0.1 % SMARTSIG:1 Application Topical 2-3 Times Daily   ? valACYclovir (VALTREX) 1000 MG tablet Take 1 tablet (1,000 mg total) by mouth 3 (three) times daily. (Patient taking differently: Take 1,000 mg by mouth as needed.) 09/21/2020: As needed  ? venlafaxine XR (EFFEXOR XR) 37.5 MG 24 hr capsule Take 1 capsule (37.5 mg total) by mouth daily with breakfast for 4 days, THEN 2 capsules (75 mg total) daily with breakfast. 04/12/2021: Patient states she is taking 50 mg time release 1 time per day.   ? venlafaxine XR (EFFEXOR XR) 75 MG 24 hr capsule Take 1 capsule (75 mg total) by mouth daily with breakfast.   ? ?No facility-administered  encounter medications on file as of 05/22/2021.  ? ? ?Patient Active Problem List  ? Diagnosis Date Noted  ? Ceruminosis, bilateral 10/27/2020  ? Essential hypertension 10/27/2020  ? Chronic eczematous otitis externa of both ears 10/27/2020  ? Acute non-recurrent maxillary sinusitis 09/22/2020  ? Adrenal nodule ([place]) 09/20/2020  ? Chronic cough   ? Heartburn   ? Asthma 10/08/2019  ? Hiatal hernia 08/16/2019  ? Schatzki's ring 08/16/2019  ? Gastritis 08/16/2019  ? History of  colonic polyps 08/16/2019  ? Vasculitis ([place]) 05/04/2019  ? History of COVID-19 04/03/2019  ? 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/03/2019  ? 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  ? Chronic fatigue 01/20/2019  ? Hot flashes 01/20/2019  ? COVID-19 virus infection 12/12/2018  ? Viral syndrome 12/08/2018  ? Reactive airway disease 12/08/2018  ? Glaucoma 11/24/2018  ? Gastroesophageal reflux disease 11/21/2018  ? H/O esophagitis 11/21/2018  ? Elevated LDL cholesterol level 07/04/2018  ? Controlled type 2 diabetes mellitus without complication, without long-term current use of insulin ([place]) 07/04/2018  ? Healthcare maintenance 01/27/2018  ? Asthmatic bronchitis 08/19/2017  ? ? ?Conditions to be addressed/monitored:HTN and DMII ? ?Care Plan : RNCM plan of care  ?Updates made by Dannielle Karvonen, RN since 05/22/2021 12:00 AM  ?  ? ?Problem: Chronic disease management education and care coordination needs   ?Priority: High  ?  ? ?Long-Range Goal: Development of plan of care to address chronic disease managment and care coordination needs.   ?Start Date: 04/12/2021  ?Expected End Date: 07/26/2021  ?Priority: High  ?Note:   ?Current Barriers:  ?Knowledge Deficits related to plan of care for management of HTN and DMII  ?Patient states her blood sugar average is 146.  She states her blood pressure continues to be slightly elevated.  Patient unable to report exact readings due to not having her log with her at time of call.  She reports her next primary care provider visit is 05/25/21.     ?RNCM Clinical Goal(s):  ?Patient will verbalize understanding of plan for management of HTN and DMII as evidenced by patient self report and/ or notation in chart.  ?take all medications exactly as prescribed and will call provider for medication related questions as evidenced by patient self report and/ or notation in chart.    ?attend all scheduled medical appointments:   as evidenced by patient self report and/ or notation in chart        ?continue to work with Consulting civil engineer and/or Social Worker to address care management and care coordination needs related to HTN and DMII as evidenced by adherence to CM Team Scheduled appointments     through collaboration with Consulting civil engineer, provider, and care team.  ? ?Interventions: ?1:1 collaboration with primary care provider regarding development and update of comprehensive plan of care as evidenced by provider attestation and co-signature ?Inter-disciplinary care team collaboration (see longitudinal plan of care) ?Evaluation of current treatment plan related to  self management and patient's adherence to plan as established by provider ? ? ?Diabetes Interventions:  (Status:  Goal on track:  Yes.) Long Term Goal ?Assessed patient's understanding of A1c goal:  8.4 ?Reviewed medications with patient and discussed importance of medication adherence ?Discussed plans with patient for ongoing care management follow up and provided patient with direct contact information for care management team ?Reviewed scheduled/upcoming provider appointments  ?Lab Results  ?Component Value Date  ? HGBA1C 8.4 (H) 03/02/2021  ? ?Hypertension Interventions:  (Status:  Goal on track:  Yes.) Long Term Goal ?Last practice recorded BP readings:  ?BP Readings from Last 3 Encounters:  ?03/22/21 (!) 158/92  ?03/02/21 (!) 158/94  ?02/13/21 (!) 152/92  ?Most recent eGFR/CrCl: No results found for: EGFR  No components found for: CRCL ? ?Evaluation of current treatment plan related to hypertension self management and patient's adherence to plan as established by provider ?Reviewed medications with patient and discussed importance of compliance ?Discussed plans with patient for ongoing care management follow up and provided patient with direct contact information for care management team ?Advised patient to continue to  monitor blood pressure daily and record, calling PCP for findings outside established parameters ?Reviewed scheduled/upcoming provider appointments   ? ?Patient Goals/Self-Care Activities: ?- continue to check blood sugar at prescribed times and enter blood sugar readings into log. Take the blood sugar log / meter to all doctor visitsily log.   ?- Continue

## 2021-05-24 DIAGNOSIS — Z20822 Contact with and (suspected) exposure to covid-19: Secondary | ICD-10-CM | POA: Diagnosis not present

## 2021-05-25 ENCOUNTER — Encounter: Payer: Self-pay | Admitting: Family Medicine

## 2021-05-25 ENCOUNTER — Ambulatory Visit (INDEPENDENT_AMBULATORY_CARE_PROVIDER_SITE_OTHER): Payer: Medicare Other | Admitting: Family Medicine

## 2021-05-25 VITALS — BP 120/70 | HR 86 | Temp 97.1°F | Ht 63.0 in | Wt 170.4 lb

## 2021-05-25 DIAGNOSIS — I10 Essential (primary) hypertension: Secondary | ICD-10-CM

## 2021-05-25 DIAGNOSIS — E1165 Type 2 diabetes mellitus with hyperglycemia: Secondary | ICD-10-CM

## 2021-05-25 DIAGNOSIS — H6982 Other specified disorders of Eustachian tube, left ear: Secondary | ICD-10-CM

## 2021-05-25 MED ORDER — PREDNISONE 10 MG PO TABS: 10.0000 mg | ORAL_TABLET | Freq: Two times a day (BID) | ORAL | 0 refills | Status: AC

## 2021-05-25 MED ORDER — EMPAGLIFLOZIN 10 MG PO TABS: 10.0000 mg | ORAL_TABLET | Freq: Every day | ORAL | 2 refills | Status: AC

## 2021-05-25 MED ORDER — OLMESARTAN MEDOXOMIL 40 MG PO TABS: 40.0000 mg | ORAL_TABLET | Freq: Every day | ORAL | 1 refills | Status: DC

## 2021-05-25 NOTE — Progress Notes (Signed)
? ?Established Patient Office Visit ? ?Subjective:  ?Patient ID: Alejandra Hess, female    DOB: 1955/12/09  Age: 66 y.o. MRN: [id number] ? ?CC:  ?Chief Complaint  ?Patient presents with  ? Follow-up  ?  3 Month follow up Pt c/o ears being infected x 1 week. Feels that BP is still high.  ? ? ?HPI ?Alejandra Hess presents for follow-up of hypertension, uncontrolled type 2 diabetes and ear congestion.  History of recent air travel with otitis media.  Ears remain congested.  Hearing is decreased.  There is no fever or chills.  Insurance would not pay for 1-1/2 olmesartan.  She has been taking 40 mg.  Blood pressure at home has been running in the 120s over 70s to 80s.  Continues with lower dose metformin (does not tolerate this medication well) with a hemoglobin A1c of 8.4 ? ?Past Medical History:  ?Diagnosis Date  ? Allergy   ? Asthma   ? COVID-19   ? DM (diabetes mellitus) ([place])   ? GERD (gastroesophageal reflux disease)   ? Heart murmur   ? as a child  ? HLD (hyperlipidemia)   ? HSV-1 infection   ? Hypertension   ? ? ?Past Surgical History:  ?Procedure Laterality Date  ? 44 HOUR [place] STUDY N/A 11/18/2019  ? Procedure: [place] STUDY;  Surgeon: Rush Landmark Telford Nab., MD;  Location: [facility];  Service: Gastroenterology;  Laterality: N/A;  ? ABDOMINAL HYSTERECTOMY    ? AUGMENTATION MAMMAPLASTY Bilateral   ? @ 43 lift with implants  ? BREAST EXCISIONAL BIOPSY Right   ? @ 53  ? BREAST EXCISIONAL BIOPSY Left   ? '@43'$ ?  ? CHOLECYSTECTOMY    ? COLONOSCOPY    ? ESOPHAGEAL MANOMETRY N/A 11/18/2019  ? Procedure: ESOPHAGEAL MANOMETRY (EM);  Surgeon: Mansouraty, Telford Nab., MD;  Location: [facility];  Service: Gastroenterology;  Laterality: N/A;  ? UPPER GASTROINTESTINAL ENDOSCOPY    ? ? ?Family History  ?Problem Relation Age of Onset  ? Hearing loss Mother   ? Hyperlipidemia Mother   ? Hypertension Mother   ? Diabetes Mother   ? Heart attack Father   ? Heart disease Father   ? Hyperlipidemia Father   ? Hypertension  Father   ? Melanoma Father   ? Asthma Sister   ? Hyperlipidemia Sister   ? Hypertension Sister   ? Hyperlipidemia Sister   ? Breast cancer Sister 47  ? Asthma Sister   ? Depression Sister   ? Heart attack Sister   ? Heart disease Sister   ? Hypertension Sister   ? Diabetes Sister   ? Breast cancer Maternal Aunt   ?     over 70  ? Breast cancer Paternal Aunt   ?     over 43  ? Breast cancer Paternal Grandmother   ?     over 46   ? Breast cancer Maternal Aunt   ?     over 63   ? Colon cancer Maternal Grandfather   ? Esophageal cancer Neg Hx   ? Inflammatory bowel disease Neg Hx   ? Liver disease Neg Hx   ? Pancreatic cancer Neg Hx   ? Rectal cancer Neg Hx   ? Stomach cancer Neg Hx   ? ? ?Social History  ? ?Socioeconomic History  ? Marital status: Married  ?  Spouse name: Not on file  ? Number of children: 2  ? Years of education: Not on file  ? Highest education  level: Not on file  ?Occupational History  ? Occupation: Therapist, sports  ?Tobacco Use  ? Smoking status: Former  ?  Types: Cigarettes  ?  Quit date: 2013  ?  Years since quitting: 10.2  ? Smokeless tobacco: Never  ?Vaping Use  ? Vaping Use: Never used  ?Substance and Sexual Activity  ? Alcohol use: Yes  ?  Comment: 2 glasses of red wine daily  ? Drug use: Never  ? Sexual activity: Yes  ?  Partners: Male  ?  Birth control/protection: Surgical  ?  Comment: 1st intercourse- 15, partners- 28-, MARRIED- 40, hysterectomy  ?Other Topics Concern  ? Not on file  ?Social History Narrative  ? Not on file  ? ?Social Determinants of Health  ? ?Financial Resource Strain: Low Risk   ? Difficulty of Paying Living Expenses: Not hard at all  ?Food Insecurity: No Food Insecurity  ? Worried About Charity fundraiser in the Last Year: Never true  ? Ran Out of Food in the Last Year: Never true  ?Transportation Needs: No Transportation Needs  ? Lack of Transportation (Medical): No  ? Lack of Transportation (Non-Medical): No  ?Physical Activity: Inactive  ? Days of Exercise per Week: 0 days  ?  Minutes of Exercise per Session: 0 min  ?Stress: No Stress Concern Present  ? Feeling of Stress : Not at all  ?Social Connections: Moderately Integrated  ? Frequency of Communication with Friends and Family: Twice a week  ? Frequency of Social Gatherings with Friends and Family: Twice a week  ? Attends Religious Services: Never  ? Active Member of Clubs or Organizations: Yes  ? Attends Archivist Meetings: More than 4 times per year  ? Marital Status: Married  ?Intimate Partner Violence: Not At Risk  ? Fear of Current or Ex-Partner: No  ? Emotionally Abused: No  ? Physically Abused: No  ? Sexually Abused: No  ? ? ?Outpatient Medications Prior to Visit  ?Medication Sig Dispense Refill  ? albuterol (PROVENTIL) (2.5 MG/3ML) 0.083% nebulizer solution PRN  7  ? albuterol (VENTOLIN HFA) 108 (90 Base) MCG/ACT inhaler Inhale into the lungs. PRN    ? budesonide-formoterol (SYMBICORT) 160-4.5 MCG/ACT inhaler Inhale 1 puff into the lungs at bedtime as needed. 1 Inhaler 3  ? cetirizine (ZYRTEC) 10 MG tablet Take 10 mg by mouth daily.    ? glucose blood (ONETOUCH VERIO) test strip Check blood sugar before 3 meals and at bedtime. 100 each 12  ? hydroxychloroquine (PLAQUENIL) 200 MG tablet Take 200 mg by mouth 2 (two) times daily.    ? Lancets (ONETOUCH ULTRASOFT) lancets Use 1 lancet a day to check blood sugars 100 each 12  ? magnesium oxide (MAG-OX) 400 MG tablet Take 400 mg by mouth as needed.    ? metFORMIN (GLUCOPHAGE XR) 500 MG 24 hr tablet Take 1 tablet (500 mg total) by mouth 2 (two) times daily before a meal. 180 tablet 1  ? Multiple Vitamin (MULTIVITAMIN) tablet Take 1 tablet by mouth daily.    ? pantoprazole (PROTONIX) 40 MG tablet TAKE 1 TABLET BY MOUTH DAILY 90 tablet 1  ? Probiotic Product (PROBIOTIC PO) Take 1 capsule by mouth 2 (two) times daily.     ? rosuvastatin (CRESTOR) 20 MG tablet TAKE 1 TABLET(20 MG) BY MOUTH DAILY. 90 tablet 3  ? triamcinolone (NASACORT) 55 MCG/ACT AERO nasal inhaler Place 1  spray into the nose as needed.    ? valACYclovir (VALTREX) 1000 MG tablet  Take 1 tablet (1,000 mg total) by mouth 3 (three) times daily. (Patient taking differently: Take 1,000 mg by mouth as needed.) 21 tablet 0  ? venlafaxine XR (EFFEXOR XR) 75 MG 24 hr capsule Take 1 capsule (75 mg total) by mouth daily with breakfast. 30 capsule 5  ? venlafaxine XR (EFFEXOR XR) 37.5 MG 24 hr capsule Take 1 capsule (37.5 mg total) by mouth daily with breakfast for 4 days, THEN 2 capsules (75 mg total) daily with breakfast. 64 capsule 0  ? olmesartan (BENICAR) 20 MG tablet Take 1.5 tablets (30 mg total) by mouth daily. 135 tablet 1  ? triamcinolone cream (KENALOG) 0.1 % SMARTSIG:1 Application Topical 2-3 Times Daily    ? ?No facility-administered medications prior to visit.  ? ? ?Allergies  ?Allergen Reactions  ? Azithromycin Rash  ? Penicillins Hives and Rash  ? ? ?ROS ?Review of Systems  ?Constitutional:  Negative for chills, diaphoresis, fatigue, fever and unexpected weight change.  ?HENT:  Positive for hearing loss. Negative for congestion and ear pain.   ?Respiratory: Negative.    ?Cardiovascular: Negative.   ?Gastrointestinal: Negative.   ?Endocrine: Negative for polyphagia and polyuria.  ?Genitourinary: Negative.   ?Neurological: Negative.   ? ?  ?Objective:  ?  ?Physical Exam ?Vitals and nursing note reviewed.  ?Constitutional:   ?   General: She is not in acute distress. ?   Appearance: Normal appearance. She is not ill-appearing, toxic-appearing or diaphoretic.  ?HENT:  ?   Head: Normocephalic and atraumatic.  ?   Right Ear: No middle ear effusion. Tympanic membrane is not injected, perforated or retracted.  ?   Left Ear:  No middle ear effusion. Tympanic membrane is retracted. Tympanic membrane is not injected, perforated or erythematous.  ?   Mouth/Throat:  ?   Mouth: Mucous membranes are moist.  ?   Pharynx: Oropharynx is clear. No oropharyngeal exudate or posterior oropharyngeal erythema.  ?Eyes:  ?   General: No  scleral icterus.    ?   Right eye: No discharge.     ?   Left eye: No discharge.  ?   Extraocular Movements: Extraocular movements intact.  ?   Conjunctiva/sclera: Conjunctivae normal.  ?   Pupils: Pupils are equal

## 2021-05-26 ENCOUNTER — Encounter: Payer: Self-pay | Admitting: Family Medicine

## 2021-05-26 DIAGNOSIS — I10 Essential (primary) hypertension: Secondary | ICD-10-CM

## 2021-05-26 DIAGNOSIS — E119 Type 2 diabetes mellitus without complications: Secondary | ICD-10-CM

## 2021-06-10 ENCOUNTER — Encounter: Payer: Self-pay | Admitting: Family Medicine

## 2021-06-13 ENCOUNTER — Other Ambulatory Visit: Payer: Self-pay | Admitting: Family Medicine

## 2021-06-13 DIAGNOSIS — E119 Type 2 diabetes mellitus without complications: Secondary | ICD-10-CM

## 2021-06-16 ENCOUNTER — Ambulatory Visit: Payer: Medicare Other | Admitting: Dietician

## 2021-06-22 ENCOUNTER — Other Ambulatory Visit: Payer: Self-pay | Admitting: Gastroenterology

## 2021-06-26 DIAGNOSIS — Z20822 Contact with and (suspected) exposure to covid-19: Secondary | ICD-10-CM | POA: Diagnosis not present

## 2021-06-27 ENCOUNTER — Encounter: Payer: Self-pay | Admitting: Family Medicine

## 2021-06-30 ENCOUNTER — Ambulatory Visit: Payer: Medicare Other

## 2021-06-30 ENCOUNTER — Telehealth: Payer: Medicare Other

## 2021-06-30 DIAGNOSIS — I10 Essential (primary) hypertension: Secondary | ICD-10-CM

## 2021-06-30 DIAGNOSIS — E119 Type 2 diabetes mellitus without complications: Secondary | ICD-10-CM

## 2021-06-30 NOTE — Chronic Care Management (AMB) (Signed)
?  Care Management  ? ?Outreach Note ? ?06/30/2021 ?Name: Alejandra Hess MRN: [id number] DOB: 1955-04-24 ? ?Referred by: [name], MD ?Reason for referral : Chronic Care Management ? ? ?Telephone call to patient. HIPAA verified. Patients call continued to go in an out.  Patient [place] and does not think she will be able to stay connected to call. Patient request call be completed on another day.  ? ?Follow Up Plan:  ?RNCM will reschedule patient appointment.  Patient will be notified through [place] of reschedule date. ? ?Quinn Plowman RN,BSN,CCM ?RN Case Manager ?[place]  ?[phone] ? ?

## 2021-06-30 NOTE — Patient Instructions (Addendum)
Visit Information ? ?Your RN case manager rescheduled your visit at your request:  Your next telephone appointment with your RN case manager is Jul 12, 2021 at 10 am.  ? ?Quinn Plowman RN,BSN,CCM ?RN Case Manager ?[place] ?[phone] ? ?

## 2021-07-05 ENCOUNTER — Encounter: Payer: Self-pay | Admitting: Family Medicine

## 2021-07-05 ENCOUNTER — Ambulatory Visit (INDEPENDENT_AMBULATORY_CARE_PROVIDER_SITE_OTHER): Payer: Medicare Other | Admitting: Family Medicine

## 2021-07-05 VITALS — BP 122/78 | HR 80 | Temp 97.2°F | Ht 63.0 in | Wt 172.4 lb

## 2021-07-05 DIAGNOSIS — I10 Essential (primary) hypertension: Secondary | ICD-10-CM

## 2021-07-05 DIAGNOSIS — E7800 Pure hypercholesterolemia, unspecified: Secondary | ICD-10-CM

## 2021-07-05 DIAGNOSIS — E119 Type 2 diabetes mellitus without complications: Secondary | ICD-10-CM | POA: Diagnosis not present

## 2021-07-05 LAB — URINALYSIS, ROUTINE W REFLEX MICROSCOPIC
Bilirubin Urine: NEGATIVE
Hgb urine dipstick: NEGATIVE
Ketones, ur: NEGATIVE
Leukocytes,Ua: NEGATIVE
Nitrite: NEGATIVE
RBC / HPF: NONE SEEN (ref 0–?)
Specific Gravity, Urine: 1.01 (ref 1.000–1.030)
Total Protein, Urine: NEGATIVE
Urine Glucose: NEGATIVE
Urobilinogen, UA: 0.2 (ref 0.0–1.0)
pH: 8.5 — AB (ref 5.0–8.0)

## 2021-07-05 LAB — MICROALBUMIN / CREATININE URINE RATIO
Creatinine,U: 58.5 mg/dL
Microalb Creat Ratio: 1.7 mg/g (ref 0.0–30.0)
Microalb, Ur: 1 mg/dL (ref 0.0–1.9)

## 2021-07-05 LAB — BASIC METABOLIC PANEL
BUN: 19 mg/dL (ref 6–23)
CO2: 27 mEq/L (ref 19–32)
Calcium: 9.1 mg/dL (ref 8.4–10.5)
Chloride: 104 mEq/L (ref 96–112)
Creatinine, Ser: 0.74 mg/dL (ref 0.40–1.20)
GFR: 83.41 mL/min (ref >60.00)
Glucose, Bld: 136 mg/dL — ABNORMAL HIGH (ref 70–99)
Potassium: 4.5 mEq/L (ref 3.5–5.1)
Sodium: 138 mEq/L (ref 135–145)

## 2021-07-05 LAB — HEMOGLOBIN A1C: Hgb A1c MFr Bld: 7.2 % — ABNORMAL HIGH (ref 4.6–6.5)

## 2021-07-05 LAB — LDL CHOLESTEROL, DIRECT: Direct LDL: 78 mg/dL

## 2021-07-05 MED ORDER — RYBELSUS 7 MG PO TABS
7.0000 mg | ORAL_TABLET | Freq: Every day | ORAL | 5 refills | Status: DC
Start: 2021-07-05 — End: 2021-09-26

## 2021-07-05 MED ORDER — METFORMIN HCL ER 750 MG PO TB24: 750.0000 mg | ORAL_TABLET | Freq: Two times a day (BID) | ORAL | 1 refills | Status: DC

## 2021-07-05 NOTE — Progress Notes (Signed)
Established Patient Office Visit  Subjective   Patient ID: Alejandra Hess, female    DOB: [birth year]  Age: 66 y.o. MRN: [id number]  Chief Complaint  Patient presents with  . Follow-up    Follow up on Diabetes Jardiance not helping reaction.     HPI follow-up of diabetes, elevated LDL and hypertension.  Blood pressure much proved on 40 of olmesartan.  Tolerating the drug well.  Unfortunately did not tolerate Jardiance secondary to yeast infection that developed 6 days after starting the medication.  She has been up attending to her 32 year old father who recently passed.  Remer Macho was this past Saturday.  Continues rosuvastatin for cholesterol.  She had coffee with cream this morning.    Review of Systems  Constitutional:  Negative for chills, diaphoresis, malaise/fatigue and weight loss.  HENT: Negative.    Eyes: Negative.  Negative for blurred vision and double vision.  Cardiovascular:  Negative for chest pain.  Gastrointestinal:  Negative for abdominal pain.  Genitourinary: Negative.  Negative for frequency and urgency.  Musculoskeletal:  Negative for falls and myalgias.  Neurological:  Negative for speech change, loss of consciousness and weakness.  Psychiatric/Behavioral: Negative.       Objective:     BP 122/78 (BP Location: Left Arm, Patient Position: Sitting, Cuff Size: Large)   Pulse 80   Temp (!) 97.2 F (36.2 C) (Temporal)   Ht '5\' 3"'$  (1.6 m)   Wt 172 lb 6.4 oz (78.2 kg)   SpO2 97%   BMI 30.54 kg/m    Physical Exam Constitutional:      General: She is not in acute distress.    Appearance: Normal appearance. She is not ill-appearing, toxic-appearing or diaphoretic.  HENT:     Head: Normocephalic and atraumatic.     Right Ear: External ear normal.     Left Ear: External ear normal.     Mouth/Throat:     Mouth: Mucous membranes are moist.     Pharynx: Oropharynx is clear. No oropharyngeal exudate or posterior oropharyngeal erythema.  Eyes:     General: No  scleral icterus.       Right eye: No discharge.        Left eye: No discharge.     Extraocular Movements: Extraocular movements intact.     Conjunctiva/sclera: Conjunctivae normal.     Pupils: Pupils are equal, round, and reactive to light.  Cardiovascular:     Rate and Rhythm: Normal rate and regular rhythm.  Pulmonary:     Effort: Pulmonary effort is normal. No respiratory distress.     Breath sounds: Normal breath sounds.  Abdominal:     General: Bowel sounds are normal.  Musculoskeletal:     Cervical back: No rigidity or tenderness.  Skin:    General: Skin is warm and dry.  Neurological:     Mental Status: She is alert and oriented to person, place, and time.  Psychiatric:        Mood and Affect: Mood normal.        Behavior: Behavior normal.     No results found for any visits on 07/05/21.    The 10-year ASCVD risk score (Arnett DK, et al., 2019) is: 14.2%    Assessment & Plan:   Problem List Items Addressed This Visit       Cardiovascular and Mediastinum   Essential hypertension - Primary   Relevant Orders   Basic metabolic panel   Microalbumin / creatinine urine ratio   Urinalysis, Routine  w reflex microscopic     Endocrine   Controlled type 2 diabetes mellitus without complication, without long-term current use of insulin (HCC)   Relevant Medications   metFORMIN (GLUCOPHAGE-XR) 750 MG 24 hr tablet   Semaglutide (RYBELSUS) 7 MG TABS   Other Relevant Orders   Basic metabolic panel   Hemoglobin A1c   Microalbumin / creatinine urine ratio   Urinalysis, Routine w reflex microscopic     Other   Elevated LDL cholesterol level   Relevant Orders   LDL cholesterol, direct    Return in about 3 months (around 10/05/2021).   I have discontinued Jardiance and added Rybelsus.  Information was given on this medication.  Have increased Glucophage to 750 twice daily.  Urged compliance with rosuvastatin. [name], MD

## 2021-07-12 ENCOUNTER — Ambulatory Visit (INDEPENDENT_AMBULATORY_CARE_PROVIDER_SITE_OTHER): Payer: Medicare Other

## 2021-07-12 DIAGNOSIS — E119 Type 2 diabetes mellitus without complications: Secondary | ICD-10-CM

## 2021-07-12 DIAGNOSIS — I10 Essential (primary) hypertension: Secondary | ICD-10-CM

## 2021-07-12 NOTE — Chronic Care Management (AMB) (Signed)
Chronic Care Management   CCM RN Visit Note  07/12/2021 Name: Alejandra Hess MRN: [id number] DOB: 12/20/1955  Subjective: Alejandra Hess is a 65 y.o. year old female who is a primary care patient of Kremer, William Alfred, MD. The care management team was consulted for assistance with disease management and care coordination needs.    Engaged with patient by telephone for follow up visit in response to provider referral for case management and/or care coordination services.   Consent to Services:  The patient was given information about Chronic Care Management services, agreed to services, and gave verbal consent prior to initiation of services.  Please see initial visit note for detailed documentation.   Patient agreed to services and verbal consent obtained.   Assessment: Review of patient past medical history, allergies, medications, health status, including review of consultants reports, laboratory and other test data, was performed as part of comprehensive evaluation and provision of chronic care management services.   SDOH (Social Determinants of Health) assessments and interventions performed:    CCM Care Plan  Allergies  Allergen Reactions   Azithromycin Rash   Jardiance [Empagliflozin] Other (See Comments)    Yeast vaginitis   Penicillins Hives and Rash    Outpatient Encounter Medications as of 07/12/2021  Medication Sig Note   albuterol (PROVENTIL) (2.5 MG/3ML) 0.083% nebulizer solution PRN    albuterol (VENTOLIN HFA) 108 (90 Base) MCG/ACT inhaler Inhale into the lungs. PRN    budesonide-formoterol (SYMBICORT) 160-4.5 MCG/ACT inhaler Inhale 1 puff into the lungs at bedtime as needed.    cetirizine (ZYRTEC) 10 MG tablet Take 10 mg by mouth daily.    glucose blood (ONETOUCH VERIO) test strip Check blood sugar before 3 meals and at bedtime.    hydroxychloroquine (PLAQUENIL) 200 MG tablet Take 200 mg by mouth 2 (two) times daily. (Patient not taking: Reported on  07/05/2021)    Lancets (ONETOUCH ULTRASOFT) lancets Use 1 lancet a day to check blood sugars    magnesium oxide (MAG-OX) 400 MG tablet Take 400 mg by mouth as needed.    metFORMIN (GLUCOPHAGE-XR) 750 MG 24 hr tablet Take 1 tablet (750 mg total) by mouth 2 (two) times daily after a meal.    Multiple Vitamin (MULTIVITAMIN) tablet Take 1 tablet by mouth daily.    olmesartan (BENICAR) 40 MG tablet Take 1 tablet (40 mg total) by mouth daily.    pantoprazole (PROTONIX) 40 MG tablet TAKE ONE TABLET BY MOUTH ONE TIME DAILY    Probiotic Product (PROBIOTIC PO) Take 1 capsule by mouth 2 (two) times daily.  09/21/2020: Patient states she takes 1 per day.    rosuvastatin (CRESTOR) 20 MG tablet TAKE 1 TABLET(20 MG) BY MOUTH DAILY.    Semaglutide (RYBELSUS) 7 MG TABS Take 7 mg by mouth daily.    triamcinolone (NASACORT) 55 MCG/ACT AERO nasal inhaler Place 1 spray into the nose as needed. 09/21/2020: Patient states she uses as needed.    valACYclovir (VALTREX) 1000 MG tablet Take 1 tablet (1,000 mg total) by mouth 3 (three) times daily. (Patient taking differently: Take 1,000 mg by mouth as needed.) 09/21/2020: As needed   venlafaxine XR (EFFEXOR XR) 75 MG 24 hr capsule Take 1 capsule (75 mg total) by mouth daily with breakfast.    No facility-administered encounter medications on file as of 07/12/2021.    Patient Active Problem List   Diagnosis Date Noted   Uncontrolled type 2 diabetes mellitus with hyperglycemia (HCC) 05/25/2021   Dysfunction of left eustachian tube 05/25/2021     Ceruminosis, bilateral 10/27/2020   Essential hypertension 10/27/2020   Chronic eczematous otitis externa of both ears 10/27/2020   Acute non-recurrent maxillary sinusitis 09/22/2020   Adrenal nodule (HCC) 09/20/2020   Chronic cough    Heartburn    Asthma 10/08/2019   Hiatal hernia 08/16/2019   Schatzki's ring 08/16/2019   Gastritis 08/16/2019   History of colonic polyps 08/16/2019   Vasculitis ([place]) 05/04/2019   History of  COVID-19 04/03/2019   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/03/2019   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   Chronic fatigue 01/20/2019   Hot flashes 01/20/2019   COVID-19 virus infection 12/12/2018   Viral syndrome 12/08/2018   Reactive airway disease 12/08/2018   Glaucoma 11/24/2018   Gastroesophageal reflux disease 11/21/2018   H/O esophagitis 11/21/2018   Elevated LDL cholesterol level 07/04/2018   Controlled type 2 diabetes mellitus without complication, without long-term current use of insulin ([place]) 07/04/2018   Healthcare maintenance 01/27/2018   Asthmatic bronchitis 08/19/2017    Conditions to be addressed/monitored:HTN and DMII  Care Plan : [facility] plan of care  Updates made by Dannielle Karvonen, RN since 07/12/2021 12:00 AM     Problem: Chronic disease management education and care coordination needs   Priority: High     Long-Range Goal: Development of plan of care to address chronic disease managment and care coordination needs.   Start Date: 04/12/2021  Expected End Date: 10/26/2021  Priority: High  Note:   Current Barriers:  Knowledge Deficits related to plan of care for management of HTN and DMII  Patient states her blood sugar average is 156.  Patient reports she was not able to tolerate the Jardiance so her primary provider switched her to Rybelsus.  Patient states her blood pressures have responded well to the increase of olmesartan.  She states her blood pressures are ranging in the 120's/ 80's.  She states she's not checking her blood sugar as often anymore.   Confirmed with patient next follow up appointment with her primary care provider is 09/05/21.  Patient reports her father recently passed away. She states she was his caregiver.  She states her parents were married 63 years.  She states her mother is still living. Patient states she is doing ok at this time.  She states she has strong family support.         RNCM Clinical Goal(s):  Patient will verbalize understanding of plan for management of  HTN and DMII as evidenced by patient self report and/ or notation in chart.  take all medications exactly as prescribed and will call provider for medication related questions as evidenced by patient self report and/ or notation in chart.    attend all scheduled medical appointments:   as evidenced by patient self report and/ or notation in chart        continue to work with RN Care Manager and/or Social Worker to address care management and care coordination needs related to HTN and DMII as evidenced by adherence to CM Team Scheduled appointments     through collaboration with RN Care manager, provider, and care team.   Interventions: 1:1 collaboration with primary care provider regarding development and update of comprehensive plan of care as evidenced by provider attestation and co-signature Inter-disciplinary care team collaboration (see longitudinal plan of care) Evaluation of current treatment plan related to  self management and patient's adherence to plan as established by provider   Diabetes Interventions:  (Status:  Goal on track:  Yes.) Long Term Goal Assessed patient's understanding of  A1c goal:  7.2 Reviewed medications with patient and discussed importance of medication adherence Discussed plans with patient for ongoing care management follow up and provided patient with direct contact information for care management team Reviewed scheduled/upcoming provider appointments  Advised to continue to follow a low carbohydrate diet, watch portion sizes and avoid sugar sweetened drinks Lab Results  Component Value Date   HGBA1C 7.2 (H) 07/05/2021   Hypertension Interventions:  (Status:  Goal on track:  Yes.) Long Term Goal Last practice recorded BP readings:  BP Readings from Last 3 Encounters:  07/05/21 122/78  05/25/21 120/70  03/22/21 (!) 158/92  Most recent eGFR/CrCl: No results found for: EGFR  No components found for: CRCL  Evaluation of current treatment plan related to  hypertension self management and patient's adherence to plan as established by provider Reviewed medications with patient and discussed importance of compliance Discussed plans with patient for ongoing care management follow up and provided patient with direct contact information for care management team Advised patient to continue to  monitor blood pressure periodically and record, calling PCP for findings outside established parameters Reviewed scheduled/upcoming provider appointments  Advised to continue to follow a low salt diet.  Allowed patient time to talk about her feelings related to the recent death of her father.    Patient Goals/Self-Care Activities: - continue to check blood sugar at prescribed times and enter blood sugar readings into log. Take the blood sugar log / meter to all doctor visitsily log.   - Continue to follow a low carbohydrate and low salt meals, watch portion sizes and avoid sugar sweetened drinks - Continue to check blood pressure periodically and write blood pressure results in a log or diary(report blood pressure readings to your provider) - Continue to eat low salt heart healthy meals full of fruits vegetables, whole grains, lean protein, and limit fat and sugars.  RULE OF 15:   How to treat low blood sugars (Blood sugar less than 70 mg/dl  Please follow the RULE OF 15 for the treatment of hypoglycemia treatment (When your blood sugars are less than 70 mg/ dl) STEP  1:  Take 15 grams of carbohydrates when your blood sugar is low, which includes:   3-4 glucose tabs or  3-4 oz of juice or regular soda or  One tube of glucose gel STEP 2:  Recheck blood sugar in 15 minutes STEP 3:  If your blood sugar is still low at the 15 minute recheck ---then, go back to STEP 1 and treat again with another 15 grams of carbohydrates       Plan:The patient has been provided with contact information for the care management team and has been advised to call with any health  related questions or concerns.  The care management team will reach out to the patient again over the next 2-3 months. Quinn Plowman RN,BSN,CCM RN Case Manager [place]  [phone]

## 2021-07-12 NOTE — Patient Instructions (Signed)
Visit Information ? ?Thank you for taking time to visit with me today. Please don't hesitate to contact me if I can be of assistance to you before our next scheduled telephone appointment. ? ?Following are the goals we discussed today:  ?- continue to check blood sugar at prescribed times and enter blood sugar readings into log. Take the blood sugar log / meter to all doctor visitsily log.   ?- Continue to follow a low carbohydrate and low salt meals, watch portion sizes and avoid sugar sweetened drinks ?- Continue to check blood pressure periodically and write blood pressure results in a log or diary(report blood pressure readings to your provider) ?- Continue to eat low salt heart healthy meals full of fruits vegetables, whole grains, lean protein, and limit fat and sugars.  ?RULE OF 15:   How to treat low blood sugars (Blood sugar less than 70 mg/dl ? Please follow the RULE OF 15 for the treatment of hypoglycemia treatment (When your blood sugars are less than 70 mg/ dl) ?STEP  1:  Take 15 grams of carbohydrates when your blood sugar is low, which includes:  ? 3-4 glucose tabs or ? 3-4 oz of juice or regular soda or ? One tube of glucose gel ?STEP 2:  Recheck blood sugar in 15 minutes ?STEP 3:  If your blood sugar is still low at the 15 minute recheck ---then, go back to STEP 1 and treat again with another 15 grams of carbohydrates ? ?Our next appointment is by telephone on 09/27/21 at 10:00 am ? ?Please call the care guide team at [phone] if you need to cancel or reschedule your appointment.  ? ?If you are experiencing a Mental Health or [place] or need someone to talk to, please call the Suicide and Crisis Lifeline: 988 ?call 1-800-273-TALK (toll free, 24 hour hotline)  ? ?Patient verbalizes understanding of instructions and care plan provided today and agrees to view in [place]. Active MyChart status and patient understanding of how to access instructions and care plan via MyChart confirmed  with patient.    ? ?Quinn Plowman RN,BSN,CCM ?RN Case Manager ?[place]  ?[phone] ? ?

## 2021-07-26 DIAGNOSIS — Z87891 Personal history of nicotine dependence: Secondary | ICD-10-CM

## 2021-07-26 DIAGNOSIS — E1169 Type 2 diabetes mellitus with other specified complication: Secondary | ICD-10-CM

## 2021-07-26 DIAGNOSIS — I10 Essential (primary) hypertension: Secondary | ICD-10-CM | POA: Diagnosis not present

## 2021-07-26 DIAGNOSIS — Z7984 Long term (current) use of oral hypoglycemic drugs: Secondary | ICD-10-CM | POA: Diagnosis not present

## 2021-07-27 ENCOUNTER — Encounter: Payer: Self-pay | Admitting: Family Medicine

## 2021-08-01 DIAGNOSIS — H66009 Acute suppurative otitis media without spontaneous rupture of ear drum, unspecified ear: Secondary | ICD-10-CM | POA: Diagnosis not present

## 2021-08-01 DIAGNOSIS — H6090 Unspecified otitis externa, unspecified ear: Secondary | ICD-10-CM | POA: Diagnosis not present

## 2021-08-31 ENCOUNTER — Other Ambulatory Visit: Payer: Self-pay | Admitting: Gastroenterology

## 2021-09-05 ENCOUNTER — Ambulatory Visit: Payer: Medicare Other | Admitting: Family Medicine

## 2021-09-07 DIAGNOSIS — D2262 Melanocytic nevi of left upper limb, including shoulder: Secondary | ICD-10-CM | POA: Diagnosis not present

## 2021-09-07 DIAGNOSIS — D2222 Melanocytic nevi of left ear and external auricular canal: Secondary | ICD-10-CM | POA: Diagnosis not present

## 2021-09-07 DIAGNOSIS — L814 Other melanin hyperpigmentation: Secondary | ICD-10-CM | POA: Diagnosis not present

## 2021-09-07 DIAGNOSIS — L820 Inflamed seborrheic keratosis: Secondary | ICD-10-CM | POA: Diagnosis not present

## 2021-09-07 DIAGNOSIS — D225 Melanocytic nevi of trunk: Secondary | ICD-10-CM | POA: Diagnosis not present

## 2021-09-07 DIAGNOSIS — L920 Granuloma annulare: Secondary | ICD-10-CM | POA: Diagnosis not present

## 2021-09-07 DIAGNOSIS — D2261 Melanocytic nevi of right upper limb, including shoulder: Secondary | ICD-10-CM | POA: Diagnosis not present

## 2021-09-07 DIAGNOSIS — L821 Other seborrheic keratosis: Secondary | ICD-10-CM | POA: Diagnosis not present

## 2021-09-07 DIAGNOSIS — D2371 Other benign neoplasm of skin of right lower limb, including hip: Secondary | ICD-10-CM | POA: Diagnosis not present

## 2021-09-07 DIAGNOSIS — D1801 Hemangioma of skin and subcutaneous tissue: Secondary | ICD-10-CM | POA: Diagnosis not present

## 2021-09-20 ENCOUNTER — Telehealth: Payer: Self-pay

## 2021-09-20 ENCOUNTER — Ambulatory Visit (INDEPENDENT_AMBULATORY_CARE_PROVIDER_SITE_OTHER): Payer: Medicare Other

## 2021-09-20 DIAGNOSIS — I10 Essential (primary) hypertension: Secondary | ICD-10-CM

## 2021-09-20 DIAGNOSIS — E119 Type 2 diabetes mellitus without complications: Secondary | ICD-10-CM

## 2021-09-20 NOTE — Patient Instructions (Signed)
Visit Information  Alejandra Hess Congratulations on achieving your goals! It was a pleasure working with you, and I hope you continue to make great strides in improving your health.  Thank you for allowing me to share the care management and care coordination services that are available to you as part of your health plan and services through your primary care provider and medical home. Please reach out to me at [phone] if the care management/care coordination team may be of assistance to you in the future.   Quinn Plowman RN,BSN,CCM RN Care Manager Coordinator [place]  [phone]

## 2021-09-20 NOTE — Telephone Encounter (Signed)
  Care Management   Follow Up Note   09/20/2021 Name: Alejandra Hess MRN: [id number] DOB: 1955-06-08   Referred by: [name], MD Reason for referral : Care Coordination   An unsuccessful telephone outreach was attempted today. The patient was referred to the case management team for assistance with care management and care coordination.   Follow Up Plan: The care management team will reach out to the patient again over the next 30 days.   Quinn Plowman RN,BSN,CCM RN Care Manager Coordinator [place]  [phone]

## 2021-09-20 NOTE — Chronic Care Management (AMB) (Signed)
Care Management    RN Visit Note  09/20/2021 Name: Alejandra Hess MRN: [id number] DOB: 05/01/1955  Subjective: Alejandra Hess is a 66 y.o. year old female who is a primary care patient of Alejandra Maw, MD. The care management team was consulted for assistance with disease management and care coordination needs.    Engaged with patient by telephone for follow up visit in response to provider referral for case management and/or care coordination services.   Consent to Services:   [name] was given information about Care Management services today including:  Care Management services includes personalized support from designated clinical staff supervised by her physician, including individualized plan of care and coordination with other care providers 24/7 contact phone numbers for assistance for urgent and routine care needs. The patient may stop case management services at any time by phone call to the office staff.  Patient agreed to services and consent obtained.   Assessment: Review of patient past medical history, allergies, medications, health status, including review of consultants reports, laboratory and other test data, was performed as part of comprehensive evaluation and provision of chronic care management services.   SDOH (Social Determinants of Health) assessments and interventions performed:    Care Plan  Allergies  Allergen Reactions   Azithromycin Rash   Jardiance [Empagliflozin] Other (See Comments)    Yeast vaginitis   Penicillins Hives and Rash    Outpatient Encounter Medications as of 09/20/2021  Medication Sig Note   albuterol (PROVENTIL) (2.5 MG/3ML) 0.083% nebulizer solution PRN    albuterol (VENTOLIN HFA) 108 (90 Base) MCG/ACT inhaler Inhale into the lungs. PRN    budesonide-formoterol (SYMBICORT) 160-4.5 MCG/ACT inhaler Inhale 1 puff into the lungs at bedtime as needed.    cetirizine (ZYRTEC) 10 MG tablet Take 10 mg by mouth daily.     glucose blood (ONETOUCH VERIO) test strip Check blood sugar before 3 meals and at bedtime.    hydroxychloroquine (PLAQUENIL) 200 MG tablet Take 200 mg by mouth 2 (two) times daily. (Patient not taking: Reported on 07/05/2021)    Lancets (ONETOUCH ULTRASOFT) lancets Use 1 lancet a day to check blood sugars    magnesium oxide (MAG-OX) 400 MG tablet Take 400 mg by mouth as needed.    metFORMIN (GLUCOPHAGE-XR) 750 MG 24 hr tablet Take 1 tablet (750 mg total) by mouth 2 (two) times daily after a meal.    Multiple Vitamin (MULTIVITAMIN) tablet Take 1 tablet by mouth daily.    olmesartan (BENICAR) 40 MG tablet Take 1 tablet (40 mg total) by mouth daily.    pantoprazole (PROTONIX) 40 MG tablet TAKE ONE TABLET BY MOUTH ONE TIME DAILY    Probiotic Product (PROBIOTIC PO) Take 1 capsule by mouth 2 (two) times daily.  09/21/2020: Patient states she takes 1 per day.    rosuvastatin (CRESTOR) 20 MG tablet TAKE 1 TABLET(20 MG) BY MOUTH DAILY.    Semaglutide (RYBELSUS) 7 MG TABS Take 7 mg by mouth daily.    triamcinolone (NASACORT) 55 MCG/ACT AERO nasal inhaler Place 1 spray into the nose as needed. 09/21/2020: Patient states she uses as needed.    valACYclovir (VALTREX) 1000 MG tablet Take 1 tablet (1,000 mg total) by mouth 3 (three) times daily. (Patient taking differently: Take 1,000 mg by mouth as needed.) 09/21/2020: As needed   venlafaxine XR (EFFEXOR-XR) 75 MG 24 hr capsule TAKE ONE CAPSULE BY MOUTH ONE TIME DAILY WITH BREAKFAST    No facility-administered encounter medications on file as of 09/20/2021.  Patient Active Problem List   Diagnosis Date Noted   Uncontrolled type 2 diabetes mellitus with hyperglycemia ([name]) 05/25/2021   Dysfunction of left eustachian tube 05/25/2021   Ceruminosis, bilateral 10/27/2020   Essential hypertension 10/27/2020   Chronic eczematous otitis externa of both ears 10/27/2020   Acute non-recurrent maxillary sinusitis 09/22/2020   Adrenal nodule (HCC) 09/20/2020   Chronic  cough    Heartburn    Asthma 10/08/2019   Hiatal hernia 08/16/2019   Schatzki's ring 08/16/2019   Gastritis 08/16/2019   History of colonic polyps 08/16/2019   Vasculitis ([place]) 05/04/2019   History of COVID-19 04/03/2019   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/03/2019   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   Chronic fatigue 01/20/2019   Hot flashes 01/20/2019   COVID-19 virus infection 12/12/2018   Viral syndrome 12/08/2018   Reactive airway disease 12/08/2018   Glaucoma 11/24/2018   Gastroesophageal reflux disease 11/21/2018   H/O esophagitis 11/21/2018   Elevated LDL cholesterol level 07/04/2018   Controlled type 2 diabetes mellitus without complication, without long-term current use of insulin ([name]) 07/04/2018   Healthcare maintenance 01/27/2018   Asthmatic bronchitis 08/19/2017    Conditions to be addressed/monitored: HTN and DMII  Care Plan : [facility] plan of care  Updates made by Dannielle Karvonen, RN since 09/20/2021 12:00 AM     Problem: Chronic disease management education and care coordination needs   Priority: High     Long-Range Goal: Development of plan of care to address chronic disease managment and care coordination needs.   Start Date: 04/12/2021  Expected End Date: 10/26/2021  Priority: High  Note:   Goal met. Case closed Current Barriers:  Knowledge Deficits related to plan of care for management of HTN and DMII  Patient states her blood sugar average is 140.  She states her blood sugars range from 120 to 180.  She states this is an improvement.  She reports her next follow up appointment with her primary care provider is 09/26/21.  Patient reports blood pressures continue to range in the 120's/80's.  Patient states overall she is doing pretty good.  Reviewed care plan goals and determined goals were met.  Patient verbally agreed to case closure for care management services. [name]         RNCM Clinical Goal(s):  Patient will verbalize understanding of plan for management of HTN and DMII as  evidenced by patient self report and/ or notation in chart.  take all medications exactly as prescribed and will call provider for medication related questions as evidenced by patient self report and/ or notation in chart.    attend all scheduled medical appointments:   as evidenced by patient self report and/ or notation in chart        continue to work with RN Care Manager and/or Social Worker to address care management and care coordination needs related to HTN and DMII as evidenced by adherence to CM Team Scheduled appointments     through collaboration with RN Care manager, provider, and care team.   Interventions: 1:1 collaboration with primary care provider regarding development and update of comprehensive plan of care as evidenced by provider attestation and co-signature Inter-disciplinary care team collaboration (see longitudinal plan of care) Evaluation of current treatment plan related to  self management and patient's adherence to plan as established by provider   Diabetes Interventions:  (Status:  Goal Met.)  Assessed patient's understanding of A1c goal:  7.2 Reviewed medications with patient and discussed importance of medication adherence Reviewed scheduled/upcoming  provider appointments  Advised to continue to follow a low carbohydrate diet, watch portion sizes and avoid sugar sweetened drinks Lab Results  Component Value Date   HGBA1C 7.2 (H) 07/05/2021   Hypertension Interventions:  (Status:  Goal Met.)  Last practice recorded BP readings:  BP Readings from Last 3 Encounters:  07/05/21 122/78  05/25/21 120/70  03/22/21 (!) 158/92  Most recent eGFR/CrCl: No results found for: "EGFR"  No components found for: "CRCL"  Evaluation of current treatment plan related to hypertension self management and patient's adherence to plan as established by provider Reviewed medications with patient and discussed importance of compliance Advised patient to continue to  monitor blood  pressure periodically and record, calling PCP for findings outside established parameters Reviewed scheduled/upcoming provider appointments  Advised to continue to follow a low salt diet.   Patient Goals/Self-Care Activities: - continue to check blood sugar at prescribed times and enter blood sugar readings into log. Take the blood sugar log / meter to all doctor visitsily log.   - Continue to follow a low carbohydrate and low salt meals, watch portion sizes and avoid sugar sweetened drinks - Continue to check blood pressure periodically and write blood pressure results in a log or diary(report blood pressure readings to your provider) - Continue to eat low salt heart healthy meals full of fruits vegetables, whole grains, lean protein, and limit fat and sugars.  RULE OF 15:   How to treat low blood sugars (Blood sugar less than 70 mg/dl  Please follow the RULE OF 15 for the treatment of hypoglycemia treatment (When your blood sugars are less than 70 mg/ dl) STEP  1:  Take 15 grams of carbohydrates when your blood sugar is low, which includes:   3-4 glucose tabs or  3-4 oz of juice or regular soda or  One tube of glucose gel STEP 2:  Recheck blood sugar in 15 minutes STEP 3:  If your blood sugar is still low at the 15 minute recheck ---then, go back to STEP 1 and treat again with another 15 grams of carbohydrates       Plan: No further follow up required: Goals met. Case closed  Quinn Plowman RN,BSN,CCM RN Care Manager Coordinator [place] [phone]

## 2021-09-25 ENCOUNTER — Ambulatory Visit (INDEPENDENT_AMBULATORY_CARE_PROVIDER_SITE_OTHER): Payer: Medicare Other | Admitting: Gastroenterology

## 2021-09-25 ENCOUNTER — Encounter: Payer: Self-pay | Admitting: Gastroenterology

## 2021-09-25 VITALS — BP 146/82 | HR 69 | Ht 63.0 in | Wt 162.5 lb

## 2021-09-25 DIAGNOSIS — Z86010 Personal history of colonic polyps: Secondary | ICD-10-CM | POA: Diagnosis not present

## 2021-09-25 DIAGNOSIS — R12 Heartburn: Secondary | ICD-10-CM | POA: Diagnosis not present

## 2021-09-25 DIAGNOSIS — I10 Essential (primary) hypertension: Secondary | ICD-10-CM | POA: Diagnosis not present

## 2021-09-25 DIAGNOSIS — K921 Melena: Secondary | ICD-10-CM | POA: Diagnosis not present

## 2021-09-25 DIAGNOSIS — R1013 Epigastric pain: Secondary | ICD-10-CM

## 2021-09-25 DIAGNOSIS — E119 Type 2 diabetes mellitus without complications: Secondary | ICD-10-CM | POA: Diagnosis not present

## 2021-09-25 DIAGNOSIS — R194 Change in bowel habit: Secondary | ICD-10-CM

## 2021-09-25 DIAGNOSIS — K5900 Constipation, unspecified: Secondary | ICD-10-CM | POA: Diagnosis not present

## 2021-09-25 MED ORDER — CLENPIQ 10-3.5-12 MG-GM -GM/175ML PO SOLN: 1.0000 | Freq: Once | ORAL | 0 refills | Status: AC

## 2021-09-25 NOTE — Progress Notes (Signed)
Chief Complaint:    Colon polyp surveillance, functional heartburn, epigastric pain, rectal bleeding  GI History: Alejandra Hess is a 66 year old female with a history of diabetes, HTN, HLD, asthma, prior hemorrhoidectomy 35+ years ago, initially referred to me in 10/2019 by Dr. Rush Landmark for evaluation of possible antireflux intervention with Transoral Incisionless Fundoplication (TIF) with a goal to stop or significantly reduce acid suppression therapy.  Was subsequently diagnosed with functional heartburn, so antireflux surgery not indicated.  Was started on Effexor with good clinical response.    GERD history: -Index symptoms: HB, sinus congestion, sore throat, nocturnal cough -Exacerbating features: Nocturnal (cough, HB), post prandial -Medications trialed: Dexilant, Domperidone, omeprazole, Zantac, Pepcid, Carafate -Current medications: Effexor 50 mg/day, Protonix 40 mg QAM; HOB elevation -Complications: None   GERD evaluation: -Last EGD: 07/2019 -Barium esophagram: None -Esophageal Manometry:  10/2019: Normal -pH/Impedance:  10/2019: [place] 11, pH <4 of 3.7%, normal impedance, negative symptom correlation by SAP c/w Functional heartburn.  Started Effexor  -Bravo: N/A   Endoscopic History: - Colonoscopy (04/2015, [place], [place]): 3-5 mm sessile polyp at AO (path: Sessile serrated adenoma), 3-5 mm polyp in sigmoid (path: HP). -EGD (05/2019, Dr. Rush Landmark): Schatzki's ring, small <2 cm hiatal hernia, normal Z-line, gastric polyps  HPI:     Alejandra Hess is a 66 y.o. female presenting to the Gastroenterology Clinic for follow-up.  Last seen by me in the GI clinic on 03/22/2021.  Was having breakthrough heartburn side effects.  Increased to 75 mg/day ER tablets resume Protonix 40 mg/day.  We discussed weaning off PPI depending on response to change in Effexor dosing.  Today, she reports recent epigastric pain and rectal bleeding.  Recently started on Rybelsus in May and since then  having MEG pain, dyspepsia. She has f/u with [facility] tomorrow and thinking about changing to Ozempic injections instead. Has noticed mild constipation since starting Rx as well, with subsequent BRB on tissue paper. Hx of hemorrhoidectomy 35+ years ago. Now with BM QOD (baseline was 1/day).   HB well controlled on current Effexor and Protonix. Hesitant to remove Protonix with recent GI sxs.   She would also like to schedule repeat colonoscopy for ongoing polyp surveillance.  Last colonoscopy was 2017 and notable for sessile serrated adenoma at the appendiceal orifice, with 5-year recall.      Latest Ref Rng & Units 07/05/2021   11:28 AM 03/02/2021    1:58 PM 07/01/2020   11:08 AM  BMP  Glucose 70 - 99 mg/dL 136  171  159   BUN 6 - 23 mg/dL '19  22  12   '$ Creatinine 0.40 - 1.20 mg/dL 0.74  1.01  0.70   Sodium 135 - 145 mEq/L 138  139  139   Potassium 3.5 - 5.1 mEq/L 4.5  4.3  3.9   Chloride 96 - 112 mEq/L 104  103  103   CO2 19 - 32 mEq/L '27  29  27   '$ Calcium 8.4 - 10.5 mg/dL 9.1  9.8  9.8      Review of systems:     No chest pain, no SOB, no fevers, no urinary sx   Past Medical History:  Diagnosis Date   Allergy    Asthma    COVID-19    DM (diabetes mellitus) (HCC)    GERD (gastroesophageal reflux disease)    Heart murmur    as a child   HLD (hyperlipidemia)    HSV-1 infection    Hypertension     Alejandra Hess's surgical history,  family medical history, social history, medications and allergies were all reviewed in Epic    Current Outpatient Medications  Medication Sig Dispense Refill   albuterol (PROVENTIL) (2.5 MG/3ML) 0.083% nebulizer solution PRN  7   albuterol (VENTOLIN HFA) 108 (90 Base) MCG/ACT inhaler Inhale into the lungs. PRN     budesonide-formoterol (SYMBICORT) 160-4.5 MCG/ACT inhaler Inhale 1 puff into the lungs at bedtime as needed. 1 Inhaler 3   cetirizine (ZYRTEC) 10 MG tablet Take 10 mg by mouth daily.     glucose blood (ONETOUCH VERIO) test strip Check blood sugar  before 3 meals and at bedtime. 100 each 12   hydroxychloroquine (PLAQUENIL) 200 MG tablet Take 200 mg by mouth 2 (two) times daily.     Lancets (ONETOUCH ULTRASOFT) lancets Use 1 lancet a day to check blood sugars 100 each 12   magnesium oxide (MAG-OX) 400 MG tablet Take 400 mg by mouth as needed.     metFORMIN (GLUCOPHAGE-XR) 750 MG 24 hr tablet Take 1 tablet (750 mg total) by mouth 2 (two) times daily after a meal. 180 tablet 1   Multiple Vitamin (MULTIVITAMIN) tablet Take 1 tablet by mouth daily.     olmesartan (BENICAR) 40 MG tablet Take 1 tablet (40 mg total) by mouth daily. 90 tablet 1   pantoprazole (PROTONIX) 40 MG tablet TAKE ONE TABLET BY MOUTH ONE TIME DAILY 90 tablet 1   Probiotic Product (PROBIOTIC PO) Take 1 capsule by mouth 2 (two) times daily.      rosuvastatin (CRESTOR) 20 MG tablet TAKE 1 TABLET(20 MG) BY MOUTH DAILY. 90 tablet 3   Semaglutide (RYBELSUS) 7 MG TABS Take 7 mg by mouth daily. 30 tablet 5   triamcinolone (NASACORT) 55 MCG/ACT AERO nasal inhaler Place 1 spray into the nose as needed.     valACYclovir (VALTREX) 1000 MG tablet Take 1 tablet (1,000 mg total) by mouth 3 (three) times daily. (Alejandra Hess taking differently: Take 1,000 mg by mouth as needed.) 21 tablet 0   venlafaxine XR (EFFEXOR-XR) 75 MG 24 hr capsule TAKE ONE CAPSULE BY MOUTH ONE TIME DAILY WITH BREAKFAST 30 capsule 2   No current facility-administered medications for this visit.    Physical Exam:     BP (!) 146/82   Pulse 69   Ht '5\' 3"'$  (1.6 m)   Wt 162 lb 8 oz (73.7 kg)   BMI 28.79 kg/m   GENERAL:  Pleasant female in NAD PSYCH: : Cooperative, normal affect EENT:  conjunctiva pink, mucous membranes moist, neck supple without masses CARDIAC:  RRR, no murmur heard, no peripheral edema PULM: Normal respiratory effort, lungs CTA bilaterally, no wheezing ABDOMEN:  Nondistended, soft, nontender. No obvious masses, no hepatomegaly,  normal bowel sounds SKIN:  turgor, no lesions seen Musculoskeletal:   Normal muscle tone, normal strength NEURO: Alert and oriented x 3, no focal neurologic deficits   IMPRESSION and PLAN:    1) Dyspepsia 2) Epigastric pain - Based on timing and known ADR profile, suspect UGI symptoms 2/2 Rybelsus.  However, given elevated Alejandra Hess concerns, history, and need to rule out PUD, H. pylori, etc., plan for upper endoscopy - Schedule EGD with biopsies as appropriate - Hold Rybelsus (or Ozempic if she changes to diet in the interim) for 1 week prior to endo.  She will d/w her [facility] tomorrow about DM management in that interim.   3) Functional heartburn - Well-controlled on current therapy - Continue Effexor - Can hopefully discontinue PPI soon depending on upper endoscopy findings  4) Hematochezia 5) Change in bowel habits 6) Constipation - Colonoscopy to evaluate for hemorrhoid recurrence vs alternate etiology of scant BRBPR - If clinically significant hemorrhoids, can discuss role/utility of hemorrhoid banding.  Does have a history of hemorrhoidectomy 35+ years ago - Start OTC Colace and titrate to soft stools - Discussed medication ADR profile as likely etiology for other GI symptoms  7) History of colon polyps - Due for repeat colonoscopy for ongoing polyp surveillance - Schedule colonoscopy as above  The indications, risks, and benefits of EGD and colonoscopy were explained to the Alejandra Hess in detail. Risks include but are not limited to bleeding, perforation, adverse reaction to medications, and cardiopulmonary compromise. Sequelae include but are not limited to the possibility of surgery, hospitalization, and mortality. The Alejandra Hess verbalized understanding and wished to proceed. All questions answered, referred to scheduler and bowel prep ordered. Further recommendations pending results of the exam.    Lavena Bullion ,DO, FACG 09/25/2021, 8:48 AM

## 2021-09-25 NOTE — Patient Instructions (Addendum)
If you are age 68 or younger, your body mass index should be between 19-25. Your Body mass index is 28.79 kg/m. If this is out of the aformentioned range listed, please consider follow up with your Primary Care Provider.   __________________________________________________________  The [place] [facility] providers would like to encourage you to use [facility] to communicate with providers for non-urgent requests or questions.  Due to long hold times on the telephone, sending your provider a message by [facility] may be a faster and more efficient way to get a response.  Please allow 48 business hours for a response.  Please remember that this is for non-urgent requests.   Due to recent changes in healthcare laws, you may see the results of your imaging and laboratory studies on MyChart before your provider has had a chance to review them.  We understand that in some cases there may be results that are confusing or concerning to you. Not all laboratory results come back in the same time frame and the provider may be waiting for multiple results in order to interpret others.  Please give [place] 48 hours in order for your provider to thoroughly review all the results before contacting the office for clarification of your results.   We have sent the following medications to your pharmacy for you to pick up at your convenience: Clenpiq  You have been scheduled for an endoscopy and colonoscopy. Please follow the written instructions given to you at your visit today. Please pick up your prep supplies at the pharmacy within the next 1-3 days. If you use inhalers (even only as needed), please bring them with you on the day of your procedure.    Thank you for choosing me and [facility].  Vito Cirigliano, D.O.

## 2021-09-26 ENCOUNTER — Telehealth: Payer: Self-pay | Admitting: Gastroenterology

## 2021-09-26 ENCOUNTER — Encounter: Payer: Self-pay | Admitting: Family Medicine

## 2021-09-26 ENCOUNTER — Ambulatory Visit (INDEPENDENT_AMBULATORY_CARE_PROVIDER_SITE_OTHER): Payer: Medicare Other | Admitting: Family Medicine

## 2021-09-26 VITALS — BP 142/82 | HR 88 | Temp 97.3°F | Ht 63.0 in | Wt 161.0 lb

## 2021-09-26 DIAGNOSIS — T887XXA Unspecified adverse effect of drug or medicament, initial encounter: Secondary | ICD-10-CM | POA: Diagnosis not present

## 2021-09-26 DIAGNOSIS — B002 Herpesviral gingivostomatitis and pharyngotonsillitis: Secondary | ICD-10-CM | POA: Diagnosis not present

## 2021-09-26 DIAGNOSIS — E119 Type 2 diabetes mellitus without complications: Secondary | ICD-10-CM

## 2021-09-26 DIAGNOSIS — I10 Essential (primary) hypertension: Secondary | ICD-10-CM | POA: Diagnosis not present

## 2021-09-26 DIAGNOSIS — M25519 Pain in unspecified shoulder: Secondary | ICD-10-CM

## 2021-09-26 MED ORDER — SEMAGLUTIDE (2 MG/DOSE) 8 MG/3ML ~~LOC~~ SOPN: 2.0000 mg | PEN_INJECTOR | SUBCUTANEOUS | 2 refills | Status: DC

## 2021-09-26 MED ORDER — VALACYCLOVIR HCL 1 G PO TABS: ORAL_TABLET | ORAL | 0 refills | Status: DC

## 2021-09-26 NOTE — Telephone Encounter (Signed)
Pt stated that she needs a more affordable prep than was prescribed to her: Pt was notified that our office would send a prep letter to her using [employer] for her prep: Instructions sent via my chart: pt made aware Pt verbalized understanding with all questions answered.

## 2021-09-26 NOTE — Progress Notes (Signed)
Established Patient Office Visit  Subjective   Patient ID: Alejandra Hess, female    DOB: 1955-03-28  Age: 66 y.o. MRN: [id number]  Chief Complaint  Patient presents with   Follow-up    Follow up on DM, discuss starting on Ozempic.     HPI patient has done relatively well with the Rybelsus other than developing stomach upset directly after taking.  She is interested in switching to the injectable form.  She is on weight watchers and been able to lose 10 pounds.    Review of Systems  Constitutional:  Negative for chills, diaphoresis, malaise/fatigue and weight loss.  HENT: Negative.    Eyes: Negative.  Negative for blurred vision and double vision.  Cardiovascular:  Negative for chest pain.  Gastrointestinal:  Negative for abdominal pain.  Genitourinary: Negative.   Musculoskeletal:  Negative for falls and myalgias.  Neurological:  Negative for speech change, loss of consciousness, weakness and headaches.  Psychiatric/Behavioral: Negative.        Objective:     BP (!) 144/78 (BP Location: Left Arm, Patient Position: Sitting, Cuff Size: Large)   Pulse 88   Temp (!) 97.3 F (36.3 C) (Temporal)   Ht '5\' 3"'$  (1.6 m)   Wt 161 lb (73 kg)   SpO2 96%   BMI 28.52 kg/m  Wt Readings from Last 3 Encounters:  09/26/21 161 lb (73 kg)  09/25/21 162 lb 8 oz (73.7 kg)  07/05/21 172 lb 6.4 oz (78.2 kg)      Physical Exam Constitutional:      General: She is not in acute distress.    Appearance: Normal appearance. She is not ill-appearing, toxic-appearing or diaphoretic.  HENT:     Head: Normocephalic and atraumatic.     Right Ear: External ear normal.     Left Ear: External ear normal.  Eyes:     General: No scleral icterus.       Right eye: No discharge.        Left eye: No discharge.     Extraocular Movements: Extraocular movements intact.     Conjunctiva/sclera: Conjunctivae normal.  Cardiovascular:     Rate and Rhythm: Normal rate and regular rhythm.  Pulmonary:      Effort: Pulmonary effort is normal. No respiratory distress.     Breath sounds: Normal breath sounds.  Abdominal:     General: Bowel sounds are normal.     Tenderness: There is no abdominal tenderness. There is no guarding.  Musculoskeletal:     Cervical back: No rigidity or tenderness.  Skin:    General: Skin is warm and dry.  Neurological:     Mental Status: She is alert and oriented to person, place, and time.  Psychiatric:        Mood and Affect: Mood normal.        Behavior: Behavior normal.      No results found for any visits on 09/26/21.    The 10-year ASCVD risk score (Arnett DK, et al., 2019) is: 21.5%    Assessment & Plan:   Problem List Items Addressed This Visit       Cardiovascular and Mediastinum   Essential hypertension     Digestive   Oral herpes   Relevant Medications   valACYclovir (VALTREX) 1000 MG tablet     Endocrine   Controlled type 2 diabetes mellitus without complication, without long-term current use of insulin (HCC) - Primary   Relevant Medications   Semaglutide, 2 MG/DOSE, 8 MG/3ML  SOPN     Other   Medication side effect    Return in about 3 months (around 12/27/2021).  Information was given on managing hypertension.  Checking record blood pressures.  Radiation of sodium and alcohol have switched to semaglutide.  Patient will continue weight watchers.  [name], MD

## 2021-09-26 NOTE — Telephone Encounter (Signed)
Inbound call from patient stating her insurance will not cover prep needed for upcoming appointment 10/27/21. Patient would like a more affordable prep. Please give a call back to further advise.  Thank you

## 2021-09-27 ENCOUNTER — Telehealth: Payer: Medicare Other

## 2021-09-27 ENCOUNTER — Telehealth: Payer: Self-pay | Admitting: Family Medicine

## 2021-09-27 DIAGNOSIS — E119 Type 2 diabetes mellitus without complications: Secondary | ICD-10-CM

## 2021-09-27 MED ORDER — SEMAGLUTIDE (2 MG/DOSE) 8 MG/3ML ~~LOC~~ SOPN: 2.0000 mg | PEN_INJECTOR | SUBCUTANEOUS | 2 refills | Status: DC

## 2021-09-27 NOTE — Telephone Encounter (Signed)
Caller Name: Aubriana Edgington Call back phone #: [phone]  MEDICATION(S): Semaglutide, 2 MG/DOSE, 8 MG/3ML SOPN [[id number]]  ozempic  Days of Med Remaining:   Has the patient contacted their pharmacy (YES/NO)? y What did pharmacy advise?  Preferred Pharmacy: [facility] [address], [place], [place] [address]  ~15.2 mi [phone]  ~~~Please advise patient/caregiver to allow 2-3 business days to process RX refills.   Pt stated that this is the only pharmacy that has it

## 2021-09-27 NOTE — Telephone Encounter (Signed)
Per patient Ozempic needs to be sent to [facility] no other pharmacies have this in stock. Rx sent to requested pharmacy for pick up.

## 2021-10-04 ENCOUNTER — Encounter: Payer: Self-pay | Admitting: Family Medicine

## 2021-10-04 ENCOUNTER — Ambulatory Visit (INDEPENDENT_AMBULATORY_CARE_PROVIDER_SITE_OTHER): Payer: Medicare Other | Admitting: Family Medicine

## 2021-10-04 DIAGNOSIS — M25511 Pain in right shoulder: Secondary | ICD-10-CM | POA: Diagnosis not present

## 2021-10-04 MED ORDER — METHYLPREDNISOLONE ACETATE 40 MG/ML IJ SUSP
40.0000 mg | Freq: Once | INTRAMUSCULAR | Status: AC
  Administered 2021-10-04: 40 mg via INTRA_ARTICULAR

## 2021-10-04 NOTE — Progress Notes (Signed)
PCP: [name], MD  Subjective:   HPI: Patient is a 66 y.o. female here for right shoulder pain.  Patient has had 4-6 months of shoulder pain. She thinks it started after she reached across her body to the back of the car to pick something up. Her pain is aggravated by reaching into cabinets and reaching behind herself. She has been resting, avoiding aggravating activities, heat, and ice. She has not been taking any pain medications.    Past Medical History:  Diagnosis Date   Allergy    Asthma    COVID-19    DM (diabetes mellitus) (HCC)    GERD (gastroesophageal reflux disease)    Heart murmur    as a child   HLD (hyperlipidemia)    HSV-1 infection    Hypertension     Current Outpatient Medications on File Prior to Visit  Medication Sig Dispense Refill   albuterol (PROVENTIL) (2.5 MG/3ML) 0.083% nebulizer solution PRN  7   albuterol (VENTOLIN HFA) 108 (90 Base) MCG/ACT inhaler Inhale into the lungs. PRN     budesonide-formoterol (SYMBICORT) 160-4.5 MCG/ACT inhaler Inhale 1 puff into the lungs at bedtime as needed. 1 Inhaler 3   cetirizine (ZYRTEC) 10 MG tablet Take 10 mg by mouth daily.     glucose blood (ONETOUCH VERIO) test strip Check blood sugar before 3 meals and at bedtime. 100 each 12   hydroxychloroquine (PLAQUENIL) 200 MG tablet Take 200 mg by mouth 2 (two) times daily. (Patient not taking: Reported on 09/26/2021)     Lancets (ONETOUCH ULTRASOFT) lancets Use 1 lancet a day to check blood sugars 100 each 12   magnesium oxide (MAG-OX) 400 MG tablet Take 400 mg by mouth as needed.     metFORMIN (GLUCOPHAGE-XR) 750 MG 24 hr tablet Take 1 tablet (750 mg total) by mouth 2 (two) times daily after a meal. 180 tablet 1   Multiple Vitamin (MULTIVITAMIN) tablet Take 1 tablet by mouth daily.     olmesartan (BENICAR) 40 MG tablet Take 1 tablet (40 mg total) by mouth daily. 90 tablet 1   pantoprazole (PROTONIX) 40 MG tablet TAKE ONE TABLET BY MOUTH ONE TIME DAILY 90 tablet 1    Probiotic Product (PROBIOTIC PO) Take 1 capsule by mouth 2 (two) times daily.      rosuvastatin (CRESTOR) 20 MG tablet TAKE 1 TABLET(20 MG) BY MOUTH DAILY. 90 tablet 3   Semaglutide, 2 MG/DOSE, 8 MG/3ML SOPN Inject 2 mg as directed once a week. 3 mL 2   triamcinolone (NASACORT) 55 MCG/ACT AERO nasal inhaler Place 1 spray into the nose as needed.     valACYclovir (VALTREX) 1000 MG tablet Take 1/2 tablet 2 times daily for 3 days as needed for outbreaks. 9 tablet 0   venlafaxine XR (EFFEXOR-XR) 75 MG 24 hr capsule TAKE ONE CAPSULE BY MOUTH ONE TIME DAILY WITH BREAKFAST 30 capsule 2   No current facility-administered medications on file prior to visit.    Past Surgical History:  Procedure Laterality Date   80 HOUR [place] STUDY N/A 11/18/2019   Procedure: 24 HOUR PH STUDY;  Surgeon: Irving Copas., MD;  Location: [facility];  Service: Gastroenterology;  Laterality: N/A;   ABDOMINAL HYSTERECTOMY     AUGMENTATION MAMMAPLASTY Bilateral    @ 43 lift with implants   BREAST EXCISIONAL BIOPSY Right    @ 53   BREAST EXCISIONAL BIOPSY Left    '@43'$ ?   CHOLECYSTECTOMY     COLONOSCOPY  ESOPHAGEAL MANOMETRY N/A 11/18/2019   Procedure: ESOPHAGEAL MANOMETRY (EM);  Surgeon: Irving Copas., MD;  Location: [facility];  Service: Gastroenterology;  Laterality: N/A;   UPPER GASTROINTESTINAL ENDOSCOPY      Allergies  Allergen Reactions   Azithromycin Rash   Jardiance [Empagliflozin] Other (See Comments)    Yeast vaginitis   Penicillins Hives and Rash    BP 120/68   Ht '5\' 3"'$  (1.6 m)   Wt 155 lb (70.3 kg)   BMI 27.46 kg/m       No data to display              No data to display              Objective:  Physical Exam:  Gen: NAD, comfortable in exam room Shoulder, right:  No skin changes, erythema, or ecchymosis noted. No evidence of bony deformity, asymmetry, or muscle atrophy; No tenderness over long head of biceps (bicipital groove). No TTP at [facility] joint. Full  active and passive range of motion (180 flex Huel Cote /150Abd /90ER /70IR), Thumb to back pocket with some tenderness. Strength 5/5 throughout.   Special Tests:   - Painful Arc present at 120 degrees   - Empty can: Painful but not weak   - Int/Ext Rotation test: NEG   - Hawkins: NEG   - Neer test: NEG   - O'brien's test: NEG   - Yergason's: NEG   - Speeds test: NEG   - Apprehension test: NEG    Assessment & Plan:  1. Right shoulder pain likely 2/2 supraspinatus strain and impingement. Performed right shoulder steroid injection today. Recommend home exercises. Take ibuprofen, Voltaren gel, or Aleve as needed. Can consider ultrasound and formal physical therapy if no improvement.   After informed written consent timeout was performed, patient was seated in chair in exam room. Right shoulder was prepped with alcohol swab and utilizing lateral approach with ultrasound guidance, patient's right subacromial space was injected with 3:1 lidocaine: depomedrol. Patient tolerated the procedure well without immediate complications.  Virgel Manifold, MS4

## 2021-10-04 NOTE — Patient Instructions (Signed)
You have rotator cuff impingement Try to avoid painful activities (overhead activities, lifting with extended arm) as much as possible. Aleve 2 tabs twice a day with food OR ibuprofen 3 tabs three times a day with food for pain and inflammation as needed. Can take tylenol in addition to this. Subacromial injection may be beneficial to help with pain and to decrease inflammation - you were given this today. Consider physical therapy with transition to home exercise program. Do home exercise program with theraband and scapular stabilization exercises daily 3 sets of 10 once a day. If not improving at follow-up we will consider further imaging, physical therapy, and/or nitro patches. Follow up with me in 6 weeks.

## 2021-10-05 ENCOUNTER — Ambulatory Visit: Payer: Medicare Other | Admitting: Family Medicine

## 2021-10-27 ENCOUNTER — Encounter: Payer: Self-pay | Admitting: Gastroenterology

## 2021-10-27 ENCOUNTER — Ambulatory Visit (AMBULATORY_SURGERY_CENTER): Payer: Medicare Other | Admitting: Gastroenterology

## 2021-10-27 VITALS — BP 141/84 | HR 58 | Temp 96.2°F | Resp 12 | Ht 63.0 in | Wt 162.0 lb

## 2021-10-27 DIAGNOSIS — D121 Benign neoplasm of appendix: Secondary | ICD-10-CM | POA: Diagnosis not present

## 2021-10-27 DIAGNOSIS — Z86010 Personal history of colonic polyps: Secondary | ICD-10-CM

## 2021-10-27 DIAGNOSIS — D123 Benign neoplasm of transverse colon: Secondary | ICD-10-CM | POA: Diagnosis not present

## 2021-10-27 DIAGNOSIS — E119 Type 2 diabetes mellitus without complications: Secondary | ICD-10-CM | POA: Diagnosis not present

## 2021-10-27 DIAGNOSIS — K640 First degree hemorrhoids: Secondary | ICD-10-CM | POA: Diagnosis not present

## 2021-10-27 DIAGNOSIS — R12 Heartburn: Secondary | ICD-10-CM

## 2021-10-27 DIAGNOSIS — J45909 Unspecified asthma, uncomplicated: Secondary | ICD-10-CM | POA: Diagnosis not present

## 2021-10-27 DIAGNOSIS — R1013 Epigastric pain: Secondary | ICD-10-CM | POA: Diagnosis not present

## 2021-10-27 DIAGNOSIS — K5900 Constipation, unspecified: Secondary | ICD-10-CM | POA: Diagnosis not present

## 2021-10-27 DIAGNOSIS — K2960 Other gastritis without bleeding: Secondary | ICD-10-CM

## 2021-10-27 DIAGNOSIS — K219 Gastro-esophageal reflux disease without esophagitis: Secondary | ICD-10-CM | POA: Diagnosis not present

## 2021-10-27 DIAGNOSIS — D120 Benign neoplasm of cecum: Secondary | ICD-10-CM | POA: Diagnosis not present

## 2021-10-27 DIAGNOSIS — D122 Benign neoplasm of ascending colon: Secondary | ICD-10-CM

## 2021-10-27 DIAGNOSIS — K317 Polyp of stomach and duodenum: Secondary | ICD-10-CM | POA: Diagnosis not present

## 2021-10-27 DIAGNOSIS — I10 Essential (primary) hypertension: Secondary | ICD-10-CM | POA: Diagnosis not present

## 2021-10-27 MED ORDER — SODIUM CHLORIDE 0.9 % IV SOLN: 500.0000 mL | Freq: Once | INTRAVENOUS | Status: DC

## 2021-10-27 NOTE — Patient Instructions (Addendum)
Handout on polyps given. Dr Vivia Ewing office will do referral to colo-rectal surgeon.    YOU HAD AN ENDOSCOPIC PROCEDURE TODAY AT [facility]:   Refer to the procedure report that was given to you for any specific questions about what was found during the examination.  If the procedure report does not answer your questions, please call your gastroenterologist to clarify.  If you requested that your care partner not be given the details of your procedure findings, then the procedure report has been included in a sealed envelope for you to review at your convenience later.  YOU SHOULD EXPECT: Some feelings of bloating in the abdomen. Passage of more gas than usual.  Walking can help get rid of the air that was put into your GI tract during the procedure and reduce the bloating. If you had a lower endoscopy (such as a colonoscopy or flexible sigmoidoscopy) you may notice spotting of blood in your stool or on the toilet paper. If you underwent a bowel prep for your procedure, you may not have a normal bowel movement for a few days.  Please Note:  You might notice some irritation and congestion in your nose or some drainage.  This is from the oxygen used during your procedure.  There is no need for concern and it should clear up in a day or so.  SYMPTOMS TO REPORT IMMEDIATELY:  Following lower endoscopy (colonoscopy or flexible sigmoidoscopy):  Excessive amounts of blood in the stool  Significant tenderness or worsening of abdominal pains  Swelling of the abdomen that is new, acute  Fever of 100F or higher  Following upper endoscopy (EGD)  Vomiting of blood or coffee ground material  New chest pain or pain under the shoulder blades  Painful or persistently difficult swallowing  New shortness of breath  Fever of 100F or higher  Black, tarry-looking stools  For urgent or emergent issues, a gastroenterologist can be reached at any hour by calling [phone]. Do not use  MyChart messaging for urgent concerns.    DIET:  We do recommend a small meal at first, but then you may proceed to your regular diet.  Drink plenty of fluids but you should avoid alcoholic beverages for 24 hours.  ACTIVITY:  You should plan to take it easy for the rest of today and you should NOT DRIVE or use heavy machinery until tomorrow (because of the sedation medicines used during the test).    FOLLOW UP: Our staff will call the number listed on your records the next business day following your procedure.  We will call around 7:15- 8:00 am to check on you and address any questions or concerns that you may have regarding the information given to you following your procedure. If we do not reach you, we will leave a message.  If you develop any symptoms (ie: fever, flu-like symptoms, shortness of breath, cough etc.) before then, please call [phone].  If you test positive for Covid 19 in the 2 weeks post procedure, please call and report this information to [place].    If any biopsies were taken you will be contacted by phone or by letter within the next 1-3 weeks.  Please call us at [phone] if you have not heard about the biopsies in 3 weeks.    SIGNATURES/CONFIDENTIALITY: You and/or your care partner have signed paperwork which will be entered into your electronic medical record.  These signatures attest to the fact that that the information above on  your After Visit Summary has been reviewed and is understood.  Full responsibility of the confidentiality of this discharge information lies with you and/or your care-partner.

## 2021-10-27 NOTE — Op Note (Signed)
[place] Patient Name: Alejandra Hess Procedure Date: 10/27/2021 11:22 AM MRN: [id number] Endoscopist: Gerrit Heck , MD Age: 66 Referring MD:  Date of Birth: 1955-12-29 Gender: Female Account #: [id number] Procedure:                Colonoscopy Indications:              Surveillance: Personal history of adenomatous                            polyps on last colonoscopy 5 years ago                           Incidental - Hematochezia, Incidental - Change in                            bowel habits, Incidental - Constipation                           Last Colonoscopy was 04/2015 in [place], [place] and                            notable for3-5 mm sessile polyp at AO (path:                            Sessile serrated adenoma), 3-5 mm polyp in sigmoid                            (path: HP). Medicines:                Monitored Anesthesia Care Procedure:                Pre-Anesthesia Assessment:                           - Prior to the procedure, a History and Physical                            was performed, and patient medications and                            allergies were reviewed. The patient's tolerance of                            previous anesthesia was also reviewed. The risks                            and benefits of the procedure and the sedation                            options and risks were discussed with the patient.                            All questions were answered, and informed consent  was obtained. Prior Anticoagulants: The patient has                            taken no previous anticoagulant or antiplatelet                            agents. ASA Grade Assessment: III - A patient with                            severe systemic disease. After reviewing the risks                            and benefits, the patient was deemed in                            satisfactory condition to undergo the procedure.                            After obtaining informed consent, the colonoscope                            was passed under direct vision. Throughout the                            procedure, the patient's blood pressure, pulse, and                            oxygen saturations were monitored continuously. The                            Olympus CF-HQ190L [id number] [id number]) Colonoscope was                            introduced through the anus and advanced to the the                            cecum, identified by appendiceal orifice and                            ileocecal valve. The colonoscopy was performed                            without difficulty. The patient tolerated the                            procedure well. The quality of the bowel                            preparation was good. The ileocecal valve,                            appendiceal orifice, and rectum were photographed. Scope In: 11:39:42 AM Scope Out: 12:11:21 PM Scope Withdrawal Time: 0 hours 26 minutes 2 seconds  Total Procedure Duration: 0  hours 31 minutes 39 seconds  Findings:                 The perianal and digital rectal examinations were                            normal.                           A 8 mm polyp was found in the appendiceal orifice.                            The polyp was sessile. The polyp was removed with a                            cold snare (jar 4). After cold snare resection,                            appeared to haev additional adenomatous tissue                            invading into the appendiceal orifice. The appendix                            was inverted using cold forceps, with confirmation                            of suspected additional adenomatous tissue. This                            was resected using cold forceps (jar 5). However, I                            suspect there is additional adenomatous tissue                            still deep to the resected margins. Estimated blood                             loss was minimal.                           Two sessile polyps were found in the transverse                            colon and ascending colon. The polyps were 2 to 3                            mm in size. These polyps were removed with a cold                            snare (jar 6). Resection and retrieval were  complete. Estimated blood loss was minimal.                           Non-bleeding internal hemorrhoids were found during                            retroflexion. The hemorrhoids were small. Complications:            No immediate complications. Estimated Blood Loss:     Estimated blood loss was minimal. Impression:               - One 8 mm polyp at the appendiceal orifice,                            removed with a cold snare. (jar 4). After cold                            snare resection, appeared to haev additional                            adenomatous tissue invading into the appendiceal                            orifice. The appendix was inverted using cold                            forceps, with confirmation of suspected additional                            adenomatous tissue. This was resected using cold                            forceps (jar 5). However, I suspect there is                            additional adenomatous tissue still deep to the                            resected margins                           - Two 2 to 3 mm polyps in the transverse colon and                            in the ascending colon, removed with a cold snare.                            Resected and retrieved.                           - Non-bleeding internal hemorrhoids. Recommendation:           - Patient has a contact number available for  emergencies. The signs and symptoms of potential                            delayed complications were discussed with the                            patient. Return to normal  activities tomorrow.                            Written discharge instructions were provided to the                            patient.                           - Resume previous diet.                           - Continue present medications.                           - Await pathology results. Based on endoscopic                            appearance and suspicion of retained adenomatous                            tissue that is out of view (deep into the                            appendix), I recommend referral to the [facility] for consideration of surgical                            resection. There does not appear to be any residual                            adenoma that is in the cecal cap.                           - Repeat colonoscopy for surveillance based on                            pathology results.                           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 Gerrit Heck, MD  10/27/2021 12:23:29 PM

## 2021-10-27 NOTE — Progress Notes (Signed)
A and O x3. Report to RN. Tolerated MAC anesthesia well.Teeth unchanged after procedure. 

## 2021-10-27 NOTE — Progress Notes (Signed)
GASTROENTEROLOGY PROCEDURE H&P NOTE   Primary Care Physician: [name], MD    Reason for Procedure:   Colon polyp surveillance, heartburn, epigastric pain, rectal bleeding  Plan:    EGD, colonoscopy   Patient is appropriate for endoscopic procedure(s) in the ambulatory ([place]) setting.  The nature of the procedure, as well as the risks, benefits, and alternatives were carefully and thoroughly reviewed with the patient. Ample time for discussion and questions allowed. The patient understood, was satisfied, and agreed to proceed.     HPI: Alejandra Hess is a 66 y.o. female who presents for EGD and colonsocopy for evaluation of multiple GI sxs to include MEG pain, rectal bleeding, HB, and polyp surveillance.   Past Medical History:  Diagnosis Date   Allergy    Asthma    COVID-19    DM (diabetes mellitus) (HCC)    GERD (gastroesophageal reflux disease)    Heart murmur    as a child   HLD (hyperlipidemia)    HSV-1 infection    Hypertension     Past Surgical History:  Procedure Laterality Date   14 HOUR [employer] STUDY N/A 11/18/2019   Procedure: 24 HOUR PH STUDY;  Surgeon: Irving Copas., MD;  Location: [facility];  Service: Gastroenterology;  Laterality: N/A;   ABDOMINAL HYSTERECTOMY     AUGMENTATION MAMMAPLASTY Bilateral    @ 43 lift with implants   BREAST EXCISIONAL BIOPSY Right    @ 53   BREAST EXCISIONAL BIOPSY Left    '@43'$ ?   CHOLECYSTECTOMY     COLONOSCOPY     ESOPHAGEAL MANOMETRY N/A 11/18/2019   Procedure: ESOPHAGEAL MANOMETRY (EM);  Surgeon: Irving Copas., MD;  Location: [facility];  Service: Gastroenterology;  Laterality: N/A;   UPPER GASTROINTESTINAL ENDOSCOPY      Prior to Admission medications   Medication Sig Start Date End Date Taking? Authorizing Provider  cetirizine (ZYRTEC) 10 MG tablet Take 10 mg by mouth daily.   Yes [provider]  glucose blood (ONETOUCH VERIO) test strip Check blood sugar before 3  meals and at bedtime. 03/01/21  Yes Renato Shin, MD  Lancets [facility] ULTRASOFT) lancets Use 1 lancet a day to check blood sugars 03/01/21  Yes Renato Shin, MD  magnesium oxide (MAG-OX) 400 MG tablet Take 400 mg by mouth as needed.   Yes [provider]  metFORMIN (GLUCOPHAGE-XR) 750 MG 24 hr tablet Take 1 tablet (750 mg total) by mouth 2 (two) times daily after a meal. 07/05/21  Yes [name], MD  Multiple Vitamin (MULTIVITAMIN) tablet Take 1 tablet by mouth daily.   Yes [provider]  olmesartan (BENICAR) 40 MG tablet Take 1 tablet (40 mg total) by mouth daily. 05/25/21  Yes [name], MD  pantoprazole (PROTONIX) 40 MG tablet TAKE ONE TABLET BY MOUTH ONE TIME DAILY 06/22/21  Yes Bo Teicher V, DO  Probiotic Product (PROBIOTIC PO) Take 1 capsule by mouth 2 (two) times daily.    Yes [provider]  rosuvastatin (CRESTOR) 20 MG tablet TAKE 1 TABLET(20 MG) BY MOUTH DAILY. 04/28/21  Yes [name], MD  venlafaxine XR (EFFEXOR-XR) 75 MG 24 hr capsule TAKE ONE CAPSULE BY MOUTH ONE TIME DAILY WITH BREAKFAST 08/31/21  Yes Edwin Cherian V, DO  albuterol (PROVENTIL) (2.5 MG/3ML) 0.083% nebulizer solution PRN 07/19/17   [provider]  albuterol (VENTOLIN HFA) 108 (90 Base) MCG/ACT inhaler Inhale into the lungs. PRN 05/18/16   [provider]  budesonide-formoterol (SYMBICORT)  160-4.5 MCG/ACT inhaler Inhale 1 puff into the lungs at bedtime as needed. 03/03/18   [name], MD  Semaglutide, 2 MG/DOSE, 8 MG/3ML SOPN Inject 2 mg as directed once a week. 09/27/21   [name], MD  triamcinolone (NASACORT) 55 MCG/ACT AERO nasal inhaler Place 1 spray into the nose as needed.    [provider]  valACYclovir (VALTREX) 1000 MG tablet Take 1/2 tablet 2 times daily for 3 days as needed for outbreaks. 09/26/21   [name], MD    Current Outpatient Medications  Medication Sig Dispense Refill    cetirizine (ZYRTEC) 10 MG tablet Take 10 mg by mouth daily.     glucose blood (ONETOUCH VERIO) test strip Check blood sugar before 3 meals and at bedtime. 100 each 12   Lancets (ONETOUCH ULTRASOFT) lancets Use 1 lancet a day to check blood sugars 100 each 12   magnesium oxide (MAG-OX) 400 MG tablet Take 400 mg by mouth as needed.     metFORMIN (GLUCOPHAGE-XR) 750 MG 24 hr tablet Take 1 tablet (750 mg total) by mouth 2 (two) times daily after a meal. 180 tablet 1   Multiple Vitamin (MULTIVITAMIN) tablet Take 1 tablet by mouth daily.     olmesartan (BENICAR) 40 MG tablet Take 1 tablet (40 mg total) by mouth daily. 90 tablet 1   pantoprazole (PROTONIX) 40 MG tablet TAKE ONE TABLET BY MOUTH ONE TIME DAILY 90 tablet 1   Probiotic Product (PROBIOTIC PO) Take 1 capsule by mouth 2 (two) times daily.      rosuvastatin (CRESTOR) 20 MG tablet TAKE 1 TABLET(20 MG) BY MOUTH DAILY. 90 tablet 3   venlafaxine XR (EFFEXOR-XR) 75 MG 24 hr capsule TAKE ONE CAPSULE BY MOUTH ONE TIME DAILY WITH BREAKFAST 30 capsule 2   albuterol (PROVENTIL) (2.5 MG/3ML) 0.083% nebulizer solution PRN  7   albuterol (VENTOLIN HFA) 108 (90 Base) MCG/ACT inhaler Inhale into the lungs. PRN     budesonide-formoterol (SYMBICORT) 160-4.5 MCG/ACT inhaler Inhale 1 puff into the lungs at bedtime as needed. 1 Inhaler 3   Semaglutide, 2 MG/DOSE, 8 MG/3ML SOPN Inject 2 mg as directed once a week. 3 mL 2   triamcinolone (NASACORT) 55 MCG/ACT AERO nasal inhaler Place 1 spray into the nose as needed.     valACYclovir (VALTREX) 1000 MG tablet Take 1/2 tablet 2 times daily for 3 days as needed for outbreaks. 9 tablet 0   Current Facility-Administered Medications  Medication Dose Route Frequency Provider Last Rate Last Admin   0.9 %  sodium chloride infusion  500 mL Intravenous Once Austen Wygant V, DO        Allergies as of 10/27/2021 - Review Complete 10/27/2021  Allergen Reaction Noted   Azithromycin Rash 07/20/2016   Jardiance  [empagliflozin] Other (See Comments) 07/05/2021   Penicillins Hives and Rash 04/06/2015    Family History  Problem Relation Age of Onset   Hearing loss Mother    Hyperlipidemia Mother    Hypertension Mother    Diabetes Mother    Heart attack Father    Heart disease Father    Hyperlipidemia Father    Hypertension Father    Melanoma Father    Asthma Sister    Hyperlipidemia Sister    Hypertension Sister    Hyperlipidemia Sister    Breast cancer Sister 16   Asthma Sister    Depression Sister    Heart attack Sister    Heart disease Sister    Hypertension  Sister    Diabetes Sister    Breast cancer Maternal Aunt        over 54   Breast cancer Paternal Aunt        over 75   Breast cancer Paternal Grandmother        over 17    Breast cancer Maternal Aunt        over 20    Colon cancer Maternal Grandfather    Esophageal cancer Neg Hx    Inflammatory bowel disease Neg Hx    Liver disease Neg Hx    Pancreatic cancer Neg Hx    Rectal cancer Neg Hx    Stomach cancer Neg Hx     Social History   Socioeconomic History   Marital status: Married    Spouse name: Not on file   Number of children: 2   Years of education: Not on file   Highest education level: Not on file  Occupational History   Occupation: RN  Tobacco Use   Smoking status: Former    Types: Cigarettes    Quit date: 2013    Years since quitting: 10.6   Smokeless tobacco: Never  Vaping Use   Vaping Use: Never used  Substance and Sexual Activity   Alcohol use: Yes    Comment: 2 glasses of red wine daily   Drug use: Never   Sexual activity: Yes    Partners: Male    Birth control/protection: Surgical    Comment: 1st intercourse- 15, partners- 37-, MARRIED- 40, hysterectomy  Other Topics Concern   Not on file  Social History Narrative   Not on file   Social Determinants of Health   Financial Resource Strain: Low Risk  (02/08/2021)   Overall Financial Resource Strain (CARDIA)    Difficulty of Paying  Living Expenses: Not hard at all  Food Insecurity: No Food Insecurity (09/22/2020)   Hunger Vital Sign    Worried About Running Out of Food in the Last Year: Never true    [place] in the Last Year: Never true  Transportation Needs: No Transportation Needs (02/08/2021)   PRAPARE - Hydrologist (Medical): No    Lack of Transportation (Non-Medical): No  Physical Activity: Inactive (02/08/2021)   Exercise Vital Sign    Days of Exercise per Week: 0 days    Minutes of Exercise per Session: 0 min  Stress: No Stress Concern Present (02/08/2021)   [place]    Feeling of Stress : Not at all  Social Connections: Moderately Integrated (02/08/2021)   Social Connection and Isolation Panel [NHANES]    Frequency of Communication with Friends and Family: Twice a week    Frequency of Social Gatherings with Friends and Family: Twice a week    Attends Religious Services: Never    Marine scientist or Organizations: Yes    Attends Music therapist: More than 4 times per year    Marital Status: Married  Human resources officer Violence: Not At Risk (02/08/2021)   Humiliation, Afraid, Rape, and Kick questionnaire    Fear of Current or Ex-Partner: No    Emotionally Abused: No    Physically Abused: No    Sexually Abused: No    Physical Exam: Vital signs in last 24 hours: '@BP'$  (!) 141/74   Pulse 73   Temp (!) 96.2 F (35.7 C)   Ht '5\' 3"'$  (1.6 m)   Wt 162  lb (73.5 kg)   SpO2 97%   BMI 28.70 kg/m  GEN: NAD EYE: Sclerae anicteric ENT: MMM CV: Non-tachycardic Pulm: CTA b/l GI: Soft, NT/ND NEURO:  Alert & Oriented x 3   Gerrit Heck, DO [facility]   10/27/2021 11:23 AM

## 2021-10-27 NOTE — Progress Notes (Signed)
Called to room to assist during endoscopic procedure.  Patient ID and intended procedure confirmed with present staff. Received instructions for my participation in the procedure from the performing physician.  

## 2021-10-27 NOTE — Op Note (Signed)
[place] Patient Name: Alejandra Hess Procedure Date: 10/27/2021 11:25 AM MRN: [id number] Endoscopist: Gerrit Heck , MD Age: 66 Referring MD:  Date of Birth: 05-07-1955 Gender: Female Account #: [id number] Procedure:                Upper GI endoscopy Indications:              Epigastric abdominal pain, Heartburn, Dyspepsia Medicines:                Monitored Anesthesia Care Procedure:                Pre-Anesthesia Assessment:                           - Prior to the procedure, a History and Physical                            was performed, and patient medications and                            allergies were reviewed. The patient's tolerance of                            previous anesthesia was also reviewed. The risks                            and benefits of the procedure and the sedation                            options and risks were discussed with the patient.                            All questions were answered, and informed consent                            was obtained. Prior Anticoagulants: The patient has                            taken no previous anticoagulant or antiplatelet                            agents. ASA Grade Assessment: III - A patient with                            severe systemic disease. After reviewing the risks                            and benefits, the patient was deemed in                            satisfactory condition to undergo the procedure.                           - Prior to the procedure, a History and Physical  was performed, and patient medications and                            allergies were reviewed. The patient's tolerance of                            previous anesthesia was also reviewed. The risks                            and benefits of the procedure and the sedation                            options and risks were discussed with the patient.                            All  questions were answered, and informed consent                            was obtained. Prior Anticoagulants: The patient has                            taken no previous anticoagulant or antiplatelet                            agents. ASA Grade Assessment: III - A patient with                            severe systemic disease. After reviewing the risks                            and benefits, the patient was deemed in                            satisfactory condition to undergo the procedure.                           After obtaining informed consent, the endoscope was                            passed under direct vision. Throughout the                            procedure, the patient's blood pressure, pulse, and                            oxygen saturations were monitored continuously. The                            Endoscope was introduced through the mouth, and                            advanced to the second part of duodenum. The upper  GI endoscopy was accomplished without difficulty.                            The patient tolerated the procedure well. Scope In: Scope Out: Findings:                 The examined esophagus was normal.                           The Z-line was regular and was found 36 cm from the                            incisors.                           Multiple small sessile polyps with no bleeding were                            found in the gastric fundus and in the gastric                            body. These polyps were removed with a cold biopsy                            forceps. Resection and retrieval were complete.                            Estimated blood loss was minimal.                           Localized minimal inflammation characterized by                            erythema was found in the gastric antrum. Biopsies                            were taken with a cold forceps for Helicobacter                             pylori testing. Estimated blood loss was minimal.                           The examined duodenum was normal. Biopsies were                            taken with a cold forceps for histology. Estimated                            blood loss was minimal. Complications:            No immediate complications. Estimated Blood Loss:     Estimated blood loss was minimal. Impression:               - Normal esophagus.                           -  Z-line regular, 36 cm from the incisors.                           - Multiple gastric polyps. Resected and retrieved.                           - Gastritis. Biopsied.                           - Normal examined duodenum. Biopsied. Recommendation:           - Patient has a contact number available for                            emergencies. The signs and symptoms of potential                            delayed complications were discussed with the                            patient. Return to normal activities tomorrow.                            Written discharge instructions were provided to the                            patient.                           - Resume previous diet.                           - Continue present medications.                           - Await pathology results. Gerrit Heck, MD 10/27/2021 12:26:31 PM

## 2021-10-31 ENCOUNTER — Telehealth: Payer: Self-pay

## 2021-10-31 NOTE — Telephone Encounter (Signed)
  Follow up Call-     10/27/2021   10:34 AM 06/16/2019    9:27 AM  Call back number  Post procedure Call Back phone  # [phone]  Permission to leave phone message Yes Yes     Patient questions:  Do you have a fever, pain , or abdominal swelling? No. Pain Score  0 *  Have you tolerated food without any problems? Yes.    Have you been able to return to your normal activities? Yes.    Do you have any questions about your discharge instructions: Diet   No. Medications  No. Follow up visit  No.  Do you have questions or concerns about your Care? No.  Actions: * If pain score is 4 or above: No action needed, pain <4.

## 2021-11-01 ENCOUNTER — Encounter: Payer: Self-pay | Admitting: Gastroenterology

## 2021-11-02 ENCOUNTER — Encounter: Payer: Medicare Other | Admitting: Gastroenterology

## 2021-11-02 ENCOUNTER — Encounter: Payer: Self-pay | Admitting: Family Medicine

## 2021-11-02 NOTE — Telephone Encounter (Signed)
Disregard previous message 

## 2021-11-05 DIAGNOSIS — Z23 Encounter for immunization: Secondary | ICD-10-CM | POA: Diagnosis not present

## 2021-11-19 ENCOUNTER — Encounter: Payer: Self-pay | Admitting: Family Medicine

## 2021-11-19 ENCOUNTER — Other Ambulatory Visit: Payer: Self-pay | Admitting: Family Medicine

## 2021-11-19 DIAGNOSIS — E119 Type 2 diabetes mellitus without complications: Secondary | ICD-10-CM

## 2021-11-19 DIAGNOSIS — I10 Essential (primary) hypertension: Secondary | ICD-10-CM

## 2021-11-20 ENCOUNTER — Other Ambulatory Visit: Payer: Self-pay

## 2021-11-20 DIAGNOSIS — Z23 Encounter for immunization: Secondary | ICD-10-CM | POA: Diagnosis not present

## 2021-11-20 DIAGNOSIS — E119 Type 2 diabetes mellitus without complications: Secondary | ICD-10-CM

## 2021-11-20 DIAGNOSIS — I10 Essential (primary) hypertension: Secondary | ICD-10-CM

## 2021-11-20 MED ORDER — METFORMIN HCL ER 750 MG PO TB24: ORAL_TABLET | ORAL | 1 refills | Status: DC

## 2021-11-20 MED ORDER — ONETOUCH VERIO FLEX SYSTEM W/DEVICE KIT: 1.0000 | PACK | 0 refills | Status: DC

## 2021-11-20 MED ORDER — OLMESARTAN MEDOXOMIL 40 MG PO TABS: 40.0000 mg | ORAL_TABLET | Freq: Every day | ORAL | 1 refills | Status: DC

## 2021-11-20 NOTE — Telephone Encounter (Signed)
Please advise message below patient states that [place] would like new meter sent to pharmacy. Okay to send? Please advise.

## 2021-11-21 ENCOUNTER — Telehealth: Payer: Self-pay | Admitting: Family Medicine

## 2021-11-21 DIAGNOSIS — E119 Type 2 diabetes mellitus without complications: Secondary | ICD-10-CM

## 2021-11-21 NOTE — Telephone Encounter (Signed)
[facility] in [employer] called wanting this Blood Glucose Monitoring Suppl ([place]) w/Device KIT [[id number]] sent to  [employer] [facility] at [facility] Address: [address], [place], [place] [address] Phone: [phone]

## 2021-11-22 MED ORDER — ONETOUCH VERIO FLEX SYSTEM W/DEVICE KIT: 1.0000 | PACK | 0 refills | Status: DC

## 2021-12-02 DIAGNOSIS — J209 Acute bronchitis, unspecified: Secondary | ICD-10-CM | POA: Diagnosis not present

## 2021-12-02 DIAGNOSIS — R062 Wheezing: Secondary | ICD-10-CM | POA: Diagnosis not present

## 2021-12-02 DIAGNOSIS — E119 Type 2 diabetes mellitus without complications: Secondary | ICD-10-CM | POA: Diagnosis not present

## 2021-12-19 DIAGNOSIS — D126 Benign neoplasm of colon, unspecified: Secondary | ICD-10-CM | POA: Diagnosis not present

## 2021-12-19 DIAGNOSIS — J189 Pneumonia, unspecified organism: Secondary | ICD-10-CM | POA: Diagnosis not present

## 2021-12-21 ENCOUNTER — Other Ambulatory Visit: Payer: Self-pay | Admitting: Gastroenterology

## 2021-12-22 ENCOUNTER — Other Ambulatory Visit: Payer: Self-pay | Admitting: Gastroenterology

## 2021-12-25 ENCOUNTER — Ambulatory Visit: Payer: Medicare Other | Admitting: Family Medicine

## 2021-12-26 ENCOUNTER — Ambulatory Visit (INDEPENDENT_AMBULATORY_CARE_PROVIDER_SITE_OTHER): Payer: Medicare Other

## 2021-12-26 ENCOUNTER — Encounter: Payer: Self-pay | Admitting: Family Medicine

## 2021-12-26 ENCOUNTER — Ambulatory Visit (INDEPENDENT_AMBULATORY_CARE_PROVIDER_SITE_OTHER): Payer: Medicare Other | Admitting: Family Medicine

## 2021-12-26 VITALS — BP 169/78 | HR 70 | Temp 97.6°F | Ht 63.0 in | Wt 158.6 lb

## 2021-12-26 DIAGNOSIS — J4530 Mild persistent asthma, uncomplicated: Secondary | ICD-10-CM | POA: Diagnosis not present

## 2021-12-26 DIAGNOSIS — R059 Cough, unspecified: Secondary | ICD-10-CM | POA: Diagnosis not present

## 2021-12-26 MED ORDER — LEVOFLOXACIN 500 MG PO TABS: 500.0000 mg | ORAL_TABLET | Freq: Every day | ORAL | 0 refills | Status: AC

## 2021-12-26 MED ORDER — PREDNISONE 10 MG PO TABS: 10.0000 mg | ORAL_TABLET | Freq: Two times a day (BID) | ORAL | 0 refills | Status: AC

## 2021-12-26 MED ORDER — BENZONATATE 200 MG PO CAPS: 200.0000 mg | ORAL_CAPSULE | Freq: Two times a day (BID) | ORAL | 0 refills | Status: DC | PRN

## 2021-12-26 NOTE — Progress Notes (Signed)
Established Patient Office Visit  Subjective   Patient ID: Alejandra Hess, female    DOB: 06/17/1955  Age: 66 y.o. MRN: [id number]  Chief Complaint  Patient presents with   Pneumonia   Follow-up    Follow up pneumonia finished medication. Pt states she is getting worse again x 3 weeks.    Pneumonia She complains of cough, sputum production and wheezing. Pertinent negatives include no myalgias.   7 of 10-day history of URI signs and symptoms.  Was seen recently and diagnosed with pneumonia and reactive airway disease.  Was treated with Levaquin and prednisone.  She finished her medications 3 days ago.  Symptoms seem to be better but have recently worsened over the last day or two with a cough productive of purulent phlegm.  There is been no fever.  She has been having used frequent nebulizer treatments.  She was treated with Levaquin.    Review of Systems  Constitutional: Negative.   HENT: Negative.    Eyes:  Negative for blurred vision, discharge and redness.  Respiratory:  Positive for cough, sputum production and wheezing.   Cardiovascular: Negative.   Gastrointestinal:  Negative for abdominal pain.  Genitourinary: Negative.   Musculoskeletal: Negative.  Negative for myalgias.  Skin:  Negative for rash.  Neurological:  Negative for tingling, loss of consciousness and weakness.  Endo/Heme/Allergies:  Negative for polydipsia.      Objective:     BP (!) 169/78 (BP Location: Left Arm, Patient Position: Sitting, Cuff Size: Normal)   Pulse 70   Temp 97.6 F (36.4 C) (Temporal)   Ht '5\' 3"'$  (1.6 m)   Wt 158 lb 9.6 oz (71.9 kg)   SpO2 97%   BMI 28.09 kg/m    Physical Exam Constitutional:      General: She is not in acute distress.    Appearance: Normal appearance. She is not ill-appearing, toxic-appearing or diaphoretic.  HENT:     Head: Normocephalic and atraumatic.     Right Ear: External ear normal.     Left Ear: External ear normal.     Mouth/Throat:     Mouth:  Mucous membranes are moist.     Pharynx: Oropharynx is clear. No oropharyngeal exudate or posterior oropharyngeal erythema.  Eyes:     General: No scleral icterus.       Right eye: No discharge.        Left eye: No discharge.     Extraocular Movements: Extraocular movements intact.     Conjunctiva/sclera: Conjunctivae normal.     Pupils: Pupils are equal, round, and reactive to light.  Cardiovascular:     Rate and Rhythm: Normal rate and regular rhythm.  Pulmonary:     Effort: Pulmonary effort is normal. No respiratory distress.     Breath sounds: Examination of the left-lower field reveals rales. Rales present.  Abdominal:     General: Bowel sounds are normal.     Tenderness: There is no abdominal tenderness. There is no guarding.  Musculoskeletal:     Cervical back: No rigidity or tenderness.  Skin:    General: Skin is warm and dry.  Neurological:     Mental Status: She is alert and oriented to person, place, and time.  Psychiatric:        Mood and Affect: Mood normal.        Behavior: Behavior normal.      No results found for any visits on 12/26/21.    The 10-year ASCVD risk score (Arnett  DK, et al., 2019) is: 28.4%    Assessment & Plan:   Problem List Items Addressed This Visit       Respiratory   Asthmatic bronchitis - Primary   Relevant Medications   levofloxacin (LEVAQUIN) 500 MG tablet   benzonatate (TESSALON) 200 MG capsule   predniSONE (DELTASONE) 10 MG tablet   Other Relevant Orders   DG Chest 2 View    Return To ER if Worse..  Repeat course of Levaquin and lower dose of prednisone.  If she does not respond to this, she may need hospitalization.  [name], MD

## 2021-12-26 NOTE — Progress Notes (Unsigned)
Pt presenting with productive cough, possible rales in LLL Recent hx of pneumonia

## 2021-12-27 ENCOUNTER — Encounter: Payer: Self-pay | Admitting: Family Medicine

## 2021-12-27 ENCOUNTER — Telehealth: Payer: Self-pay | Admitting: Family Medicine

## 2021-12-27 DIAGNOSIS — E119 Type 2 diabetes mellitus without complications: Secondary | ICD-10-CM

## 2021-12-27 MED ORDER — ONETOUCH VERIO VI STRP: ORAL_STRIP | 12 refills | Status: AC

## 2021-12-27 MED ORDER — ONETOUCH ULTRASOFT LANCETS MISC: 12 refills | Status: DC

## 2021-12-27 NOTE — Telephone Encounter (Signed)
Caller Name: [facility]  Call back phone #: [phone]  Reason for Call: Please send prescription over for [place]et[place] Verio and [place]. Asked to include diagnosis code

## 2021-12-27 NOTE — Telephone Encounter (Signed)
Requested refills sent to pharmacy with Dx codes.

## 2021-12-28 ENCOUNTER — Telehealth: Payer: Self-pay

## 2021-12-28 NOTE — Telephone Encounter (Signed)
Patient calling for xray results preformed on 12/26/21. Please advise.

## 2021-12-29 ENCOUNTER — Other Ambulatory Visit: Payer: Self-pay

## 2021-12-29 DIAGNOSIS — E119 Type 2 diabetes mellitus without complications: Secondary | ICD-10-CM

## 2021-12-29 MED ORDER — ONETOUCH DELICA PLUS LANCET33G MISC: 1.0000 | 3 refills | Status: DC

## 2021-12-29 MED ORDER — ONETOUCH DELICA PLUS LANCET33G MISC: 1.0000 | Freq: Four times a day (QID) | 3 refills | Status: AC

## 2021-12-29 NOTE — Addendum Note (Signed)
Addended by: Lynda Rainwater on: 12/29/2021 03:05 PM   Modules accepted: Orders

## 2021-12-29 NOTE — Telephone Encounter (Signed)
Please resend Lancets (Delica Plus). Medicare is specific and will not accept original prescription. Also pt is taking four times a day instead of 1. Please include diagnosis code.

## 2022-01-02 ENCOUNTER — Encounter: Payer: Self-pay | Admitting: Family Medicine

## 2022-01-02 ENCOUNTER — Ambulatory Visit (INDEPENDENT_AMBULATORY_CARE_PROVIDER_SITE_OTHER): Payer: Medicare Other | Admitting: Family Medicine

## 2022-01-02 VITALS — BP 144/87 | HR 76 | Temp 98.0°F | Ht 63.0 in | Wt 157.0 lb

## 2022-01-02 DIAGNOSIS — I10 Essential (primary) hypertension: Secondary | ICD-10-CM

## 2022-01-02 DIAGNOSIS — R059 Cough, unspecified: Secondary | ICD-10-CM

## 2022-01-02 DIAGNOSIS — J4530 Mild persistent asthma, uncomplicated: Secondary | ICD-10-CM | POA: Diagnosis not present

## 2022-01-02 LAB — BASIC METABOLIC PANEL
BUN: 18 mg/dL (ref 6–23)
CO2: 29 mEq/L (ref 19–32)
Calcium: 9.8 mg/dL (ref 8.4–10.5)
Chloride: 100 mEq/L (ref 96–112)
Creatinine, Ser: 0.86 mg/dL (ref 0.40–1.20)
GFR: 69.41 mL/min (ref >60.00)
Glucose, Bld: 153 mg/dL — ABNORMAL HIGH (ref 70–99)
Potassium: 4.6 mEq/L (ref 3.5–5.1)
Sodium: 137 mEq/L (ref 135–145)

## 2022-01-02 MED ORDER — GUAIFENESIN-CODEINE 100-10 MG/5ML PO SOLN: 5.0000 mL | Freq: Four times a day (QID) | ORAL | 0 refills | Status: DC | PRN

## 2022-01-02 MED ORDER — CHLORTHALIDONE 25 MG PO TABS: 25.0000 mg | ORAL_TABLET | Freq: Every day | ORAL | 2 refills | Status: DC

## 2022-01-02 MED ORDER — PREDNISONE 10 MG (21) PO TBPK: ORAL_TABLET | ORAL | 0 refills | Status: DC

## 2022-01-02 NOTE — Progress Notes (Signed)
Established Patient Office Visit  Subjective   Patient ID: Alejandra Hess, female    DOB: 11-03-1955  Age: 66 y.o. MRN: [id number]  Chief Complaint  Patient presents with  . Follow-up    Follow up pt states she is still coughing all night x 1 month    HPI dry cough is persisting and it is worse at night.  She has been afebrile.  Blood pressure has been elevated on multiple occasions.  She is feeling much better.    Review of Systems  Constitutional: Negative.   HENT: Negative.    Eyes:  Negative for blurred vision, discharge and redness.  Respiratory:  Positive for cough and wheezing. Negative for hemoptysis, sputum production and shortness of breath.   Cardiovascular: Negative.   Gastrointestinal:  Negative for abdominal pain.  Genitourinary: Negative.   Musculoskeletal: Negative.  Negative for myalgias.  Skin:  Negative for rash.  Neurological:  Negative for tingling, loss of consciousness and weakness.  Endo/Heme/Allergies:  Negative for polydipsia.      Objective:     BP (!) 144/87 (BP Location: Right Arm, Patient Position: Sitting, Cuff Size: Normal)   Pulse 76   Temp 98 F (36.7 C) (Temporal)   Ht '5\' 3"'$  (1.6 m)   Wt 157 lb (71.2 kg)   SpO2 96%   BMI 27.81 kg/m  BP Readings from Last 3 Encounters:  01/02/22 (!) 144/87  12/26/21 (!) 169/78  10/27/21 (!) 141/84   Wt Readings from Last 3 Encounters:  01/02/22 157 lb (71.2 kg)  12/26/21 158 lb 9.6 oz (71.9 kg)  10/27/21 162 lb (73.5 kg)      Physical Exam Constitutional:      General: She is not in acute distress.    Appearance: Normal appearance. She is not ill-appearing, toxic-appearing or diaphoretic.  HENT:     Head: Normocephalic and atraumatic.     Right Ear: External ear normal.     Left Ear: External ear normal.     Mouth/Throat:     Mouth: Mucous membranes are moist.     Pharynx: Oropharynx is clear. No oropharyngeal exudate or posterior oropharyngeal erythema.  Eyes:     General: No  scleral icterus.       Right eye: No discharge.        Left eye: No discharge.     Extraocular Movements: Extraocular movements intact.     Conjunctiva/sclera: Conjunctivae normal.     Pupils: Pupils are equal, round, and reactive to light.  Cardiovascular:     Rate and Rhythm: Normal rate and regular rhythm.  Pulmonary:     Effort: Pulmonary effort is normal. No respiratory distress.     Breath sounds: Normal breath sounds. No wheezing or rales.  Abdominal:     General: Bowel sounds are normal.     Tenderness: There is no abdominal tenderness. There is no guarding.  Musculoskeletal:     Cervical back: No rigidity or tenderness.  Skin:    General: Skin is warm and dry.  Neurological:     Mental Status: She is alert and oriented to person, place, and time.  Psychiatric:        Mood and Affect: Mood normal.        Behavior: Behavior normal.     No results found for any visits on 01/02/22.    The 10-year ASCVD risk score (Arnett DK, et al., 2019) is: 21.5%    Assessment & Plan:   Problem List Items Addressed This  Visit       Cardiovascular and Mediastinum   Essential hypertension   Relevant Medications   chlorthalidone (HYGROTON) 25 MG tablet   Other Relevant Orders   Basic metabolic panel     Respiratory   Asthmatic bronchitis - Primary   Relevant Medications   predniSONE (STERAPRED UNI-PAK 21 TAB) 10 MG (21) TBPK tablet     Other   Cough   Relevant Medications   guaiFENesin-codeine 100-10 MG/5ML syrup    Return in about 3 weeks (around 01/23/2022).  Have added chlorthalidone 25 mg to olmesartan 40 mg for improved blood pressure control.  Checking BMP.  We will follow-up in 3 weeks for blood pressure check.  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  Codeine-containing cough syrup added.  6-day Dosepak.  Follow-up in 3 weeks for blood pressure evaluation.  [name], MD

## 2022-01-03 ENCOUNTER — Encounter: Payer: Self-pay | Admitting: Family Medicine

## 2022-01-09 NOTE — Progress Notes (Signed)
Sent message, via epic in basket, requesting orders in epic from surgeon.  

## 2022-01-10 ENCOUNTER — Ambulatory Visit: Payer: Self-pay | Admitting: Surgery

## 2022-01-10 DIAGNOSIS — R739 Hyperglycemia, unspecified: Secondary | ICD-10-CM

## 2022-01-10 DIAGNOSIS — Z01818 Encounter for other preprocedural examination: Secondary | ICD-10-CM

## 2022-01-15 NOTE — Patient Instructions (Signed)
DUE TO COVID-19 ONLY TWO VISITORS  (aged 68 and older)  ARE ALLOWED TO COME WITH YOU AND STAY IN THE WAITING ROOM ONLY DURING PRE OP AND PROCEDURE.   **NO VISITORS ARE ALLOWED IN THE SHORT STAY AREA OR RECOVERY ROOM!!**  IF YOU WILL BE ADMITTED INTO THE HOSPITAL YOU ARE ALLOWED ONLY FOUR SUPPORT PEOPLE DURING VISITATION HOURS ONLY (7 AM -8PM)   The support person(s) must pass our screening, gel in and out, and wear a mask at all times, including in the patient's room. Patients must also wear a mask when staff or their support person are in the room. Visitors GUEST BADGE MUST BE WORN VISIBLY  One adult visitor may remain with you overnight and MUST be in the room by 8 P.M.     Your procedure is scheduled on: 01/29/22   Report to [facility] Main Entrance    Report to admitting at : 5:15 AM   Call this number if you have problems the morning of surgery [phone]   Do not eat food :After Midnight.   Clear liquids starting the day before surgery until: 4:30 AM DAY OF SURGERY  Water Black Coffee (sugar ok, NO MILK/CREAM OR CREAMERS)  Tea (sugar ok, NO MILK/CREAM OR CREAMERS) regular and decaf                             Plain Jell-O (NO RED)                                           Fruit ices (not with fruit pulp, NO RED)                                     Popsicles (NO RED)                                                                  Juice: apple, WHITE grape, WHITE cranberry Sports drinks like Gatorade (NO RED)              Drink 2 Ensure/G2 drinks AT 10:00 PM the night before surgery.      The day of surgery:  Drink ONE (1) Pre-Surgery Clear Ensure or G2 at: 4:30 AM the morning of surgery. Drink in one sitting. Do not sip.  This drink was given to you during your hospital  pre-op appointment visit. Nothing else to drink after completing the  Pre-Surgery Clear Ensure or G2.          If you have questions, please contact your surgeon's office.  FOLLOW BOWEL  PREP AND ANY ADDITIONAL PRE OP INSTRUCTIONS YOU RECEIVED FROM YOUR SURGEON'S OFFICE!!!   Oral Hygiene is also important to reduce your risk of infection.                                    Remember - BRUSH YOUR TEETH THE MORNING OF SURGERY WITH YOUR REGULAR TOOTHPASTE   Do  NOT smoke after Midnight   Take these medicines the morning of surgery with A SIP OF WATER: venlafaxine,cetirizine,pantoprazole.Use inhalers as usual.  How to Manage Your Diabetes Before and After Surgery  Why is it important to control my blood sugar before and after surgery? Improving blood sugar levels before and after surgery helps healing and can limit problems. A way of improving blood sugar control is eating a healthy diet by:  Eating less sugar and carbohydrates  Increasing activity/exercise  Talking with your doctor about reaching your blood sugar goals High blood sugars (greater than 180 mg/dL) can raise your risk of infections and slow your recovery, so you will need to focus on controlling your diabetes during the weeks before surgery. Make sure that the doctor who takes care of your diabetes knows about your planned surgery including the date and location.  How do I manage my blood sugar before surgery? Check your blood sugar at least 4 times a day, starting 2 days before surgery, to make sure that the level is not too high or low. Check your blood sugar the morning of your surgery when you wake up and every 2 hours until you get to the Short Stay unit. If your blood sugar is less than 70 mg/dL, you will need to treat for low blood sugar: Do not take insulin. Treat a low blood sugar (less than 70 mg/dL) with  cup of clear juice (cranberry or apple), 4 glucose tablets, OR glucose gel. Recheck blood sugar in 15 minutes after treatment (to make sure it is greater than 70 mg/dL). If your blood sugar is not greater than 70 mg/dL on recheck, call [phone] for further instructions. Report your blood sugar to  the short stay nurse when you get to Short Stay.  If you are admitted to the hospital after surgery: Your blood sugar will be checked by the staff and you will probably be given insulin after surgery (instead of oral diabetes medicines) to make sure you have good blood sugar levels. The goal for blood sugar control after surgery is 80-180 mg/dL.   WHAT DO I DO ABOUT MY DIABETES MEDICATION?  Do not take oral diabetes medicines (pills) the morning of surgery.  THE NIGHT BEFORE SURGERY, take metformin as usual.      THE MORNING OF SURGERY, DO NOT TAKE ANY ORAL DIABETIC MEDICATIONS DAY OF YOUR SURGERY  DO NOT TAKE THE FOLLOWING 7 DAYS PRIOR TO SURGERY: Ozempic, Wegovy, Rybelsus (Semaglutide), Byetta (exenatide), Bydureon (exenatide ER), Victoza, Saxenda (liraglutide), or Trulicity (dulaglutide) Mounjaro (Tirzepatide) Adlyxin (Lixisenatide), Polyethylene Glycol Loxenatide.  Bring CPAP mask and tubing day of surgery.                              You may not have any metal on your body including hair pins, jewelry, and body piercing             Do not wear make-up, lotions, powders, perfumes/cologne, or deodorant  Do not wear nail polish including gel and S&S, artificial/acrylic nails, or any other type of covering on natural nails including finger and toenails. If you have artificial nails, gel coating, etc. that needs to be removed by a nail salon please have this removed prior to surgery or surgery may need to be canceled/ delayed if the surgeon/ anesthesia feels like they are unable to be safely monitored.   Do not shave  48 hours prior to surgery.   Do  not bring valuables to the hospital. [place].   Contacts, dentures or bridgework may not be worn into surgery.   Bring small overnight bag day of surgery.   DO NOT [place]. PHARMACY WILL DISPENSE MEDICATIONS LISTED ON YOUR MEDICATION LIST TO YOU  DURING YOUR ADMISSION [place]!    Patients discharged on the day of surgery will not be allowed to drive home.  Someone NEEDS to stay with you for the first 24 hours after anesthesia.   Special Instructions: Bring a copy of your healthcare power of attorney and living will documents         the day of surgery if you haven't scanned them before.              Please read over the following fact sheets you were given: IF YOU HAVE QUESTIONS ABOUT YOUR PRE-OP INSTRUCTIONS PLEASE CALL [phone]     [facility] - Preparing for Surgery Before surgery, you can play an important role.  Because skin is not sterile, your skin needs to be as free of germs as possible.  You can reduce the number of germs on your skin by washing with CHG (chlorahexidine gluconate) soap before surgery.  CHG is an antiseptic cleaner which kills germs and bonds with the skin to continue killing germs even after washing. Please DO NOT use if you have an allergy to CHG or antibacterial soaps.  If your skin becomes reddened/irritated stop using the CHG and inform your nurse when you arrive at Short Stay. Do not shave (including legs and underarms) for at least 48 hours prior to the first CHG shower.  You may shave your face/neck. Please follow these instructions carefully:  1.  Shower with CHG Soap the night before surgery and the  morning of Surgery.  2.  If you choose to wash your hair, wash your hair first as usual with your  normal  shampoo.  3.  After you shampoo, rinse your hair and body thoroughly to remove the  shampoo.                           4.  Use CHG as you would any other liquid soap.  You can apply chg directly  to the skin and wash                       Gently with a scrungie or clean washcloth.  5.  Apply the CHG Soap to your body ONLY FROM THE NECK DOWN.   Do not use on face/ open                           Wound or open sores. Avoid contact with eyes, ears mouth and genitals (private parts).                        Wash face,  Genitals (private parts) with your normal soap.             6.  Wash thoroughly, paying special attention to the area where your surgery  will be performed.  7.  Thoroughly rinse your body with warm water from the neck down.  8.  DO NOT shower/wash with your normal soap after  using and rinsing off  the CHG Soap.                9.  Pat yourself dry with a clean towel.            10.  Wear clean pajamas.            11.  Place clean sheets on your bed the night of your first shower and do not  sleep with pets. Day of Surgery : Do not apply any lotions/deodorants the morning of surgery.  Please wear clean clothes to the hospital/surgery center.  FAILURE TO FOLLOW THESE INSTRUCTIONS MAY RESULT IN THE CANCELLATION OF YOUR SURGERY PATIENT SIGNATURE_________________________________  NURSE SIGNATURE__________________________________  ________________________________________________________________________

## 2022-01-16 ENCOUNTER — Encounter (HOSPITAL_COMMUNITY): Payer: Self-pay

## 2022-01-16 ENCOUNTER — Other Ambulatory Visit: Payer: Self-pay

## 2022-01-16 ENCOUNTER — Encounter (HOSPITAL_COMMUNITY)
Admission: RE | Admit: 2022-01-16 | Discharge: 2022-01-16 | Disposition: A | Discharge status: Home / Self Care | Payer: Medicare Other | Source: Ambulatory Visit | Attending: Surgery | Admitting: Surgery

## 2022-01-16 VITALS — BP 144/75 | HR 75 | Temp 98.4°F | Ht 63.0 in | Wt 159.0 lb

## 2022-01-16 DIAGNOSIS — Z01818 Encounter for other preprocedural examination: Secondary | ICD-10-CM | POA: Diagnosis not present

## 2022-01-16 DIAGNOSIS — E1165 Type 2 diabetes mellitus with hyperglycemia: Secondary | ICD-10-CM | POA: Insufficient documentation

## 2022-01-16 DIAGNOSIS — I10 Essential (primary) hypertension: Secondary | ICD-10-CM | POA: Diagnosis not present

## 2022-01-16 DIAGNOSIS — R739 Hyperglycemia, unspecified: Secondary | ICD-10-CM

## 2022-01-16 HISTORY — DX: Pneumonia, unspecified organism: J18.9

## 2022-01-16 LAB — CBC WITH DIFFERENTIAL/PLATELET
Abs Immature Granulocytes: 0.03 10*3/uL (ref 0.00–0.07)
Basophils Absolute: 0 10*3/uL (ref 0.0–0.1)
Basophils Relative: 1 %
Eosinophils Absolute: 0.1 10*3/uL (ref 0.0–0.5)
Eosinophils Relative: 1 %
HCT: 41.9 % (ref 36.0–46.0)
Hemoglobin: 13.5 g/dL (ref 12.0–15.0)
Immature Granulocytes: 0 %
Lymphocytes Relative: 35 %
Lymphs Abs: 2.4 10*3/uL (ref 0.7–4.0)
MCH: 31 pg (ref 26.0–34.0)
MCHC: 32.2 g/dL (ref 30.0–36.0)
MCV: 96.3 fL (ref 80.0–100.0)
Monocytes Absolute: 0.5 10*3/uL (ref 0.1–1.0)
Monocytes Relative: 8 %
Neutro Abs: 3.8 10*3/uL (ref 1.7–7.7)
Neutrophils Relative %: 55 %
Platelets: 269 10*3/uL (ref 150–400)
RBC: 4.35 MIL/uL (ref 3.87–5.11)
RDW: 12.9 % (ref 11.5–15.5)
WBC: 6.9 10*3/uL (ref 4.0–10.5)
nRBC: 0 % (ref 0.0–0.2)

## 2022-01-16 LAB — HEMOGLOBIN A1C
Hgb A1c MFr Bld: 6.7 % — ABNORMAL HIGH (ref 4.8–5.6)
Mean Plasma Glucose: 145.59 mg/dL

## 2022-01-16 LAB — GLUCOSE, CAPILLARY: Glucose-Capillary: 131 mg/dL — ABNORMAL HIGH (ref 70–99)

## 2022-01-16 LAB — TYPE AND SCREEN
ABO/RH(D): O NEG
Antibody Screen: NEGATIVE

## 2022-01-16 NOTE — Progress Notes (Addendum)
For Short Stay: [place] appointment date:  Bowel Prep reminder:   For Anesthesia: PCP - Dr. Mortimer Fries. LOV: 01/02/22 Cardiologist - N/A  Chest x-ray - 12/29/21 EKG -  Stress Test -  ECHO -  Cardiac Cath -  Pacemaker/ICD device last checked: Pacemaker orders received: Device Rep notified:  Spinal Cord Stimulator:  Sleep Study -  CPAP -   Fasting Blood Sugar - N/A Checks Blood Sugar _0____ times a day Date and result of last Hgb A1c-7.2: 07/05/21  Last dose of GLP1 agonist-  GLP1 instructions:   Last dose of SGLT-2 inhibitors- Semaglutide : 01/16/22 (two weeks before as per surgeon instructions) SGLT-2 instructions:   Blood Thinner Instructions: Aspirin Instructions: Last Dose:  Activity level: Can go up a flight of stairs and activities of daily living without stopping and without chest pain and/or shortness of breath   Able to exercise without chest pain and/or shortness of breath   Unable to go up a flight of stairs without chest pain and/or shortness of breath     Anesthesia review: Hx: HTN,DIA,Heart murmur.  Patient denies shortness of breath, fever, cough and chest pain at PAT appointment   Patient verbalized understanding of instructions that were given to them at the PAT appointment. Patient was also instructed that they will need to review over the PAT instructions again at home before surgery.

## 2022-01-17 ENCOUNTER — Ambulatory Visit (INDEPENDENT_AMBULATORY_CARE_PROVIDER_SITE_OTHER): Payer: Medicare Other | Admitting: Family Medicine

## 2022-01-17 ENCOUNTER — Encounter: Payer: Self-pay | Admitting: Family Medicine

## 2022-01-17 VITALS — BP 140/80 | HR 90 | Temp 97.1°F | Ht 63.0 in | Wt 163.2 lb

## 2022-01-17 DIAGNOSIS — I10 Essential (primary) hypertension: Secondary | ICD-10-CM | POA: Diagnosis not present

## 2022-01-17 DIAGNOSIS — R9431 Abnormal electrocardiogram [ECG] [EKG]: Secondary | ICD-10-CM | POA: Diagnosis not present

## 2022-01-17 DIAGNOSIS — Z23 Encounter for immunization: Secondary | ICD-10-CM

## 2022-01-17 MED ORDER — CARVEDILOL 6.25 MG PO TABS: 6.2500 mg | ORAL_TABLET | Freq: Two times a day (BID) | ORAL | 3 refills | Status: DC

## 2022-01-17 NOTE — Progress Notes (Signed)
Established Patient Office Visit  Subjective   Patient ID: Alejandra Hess, female    DOB: [birth year]  Age: 66 y.o. MRN: [id number]  Chief Complaint  Patient presents with   Follow-up    Follow up patient having appendix removed next month discuss EKG.     HPI for follow-up of hypertension and abnormal preoperative EKG taking yesterday.  Continues with Benicar 40 mg daily with the addition of chlorthalidone 25 mg 2 weeks ago.  Blood pressure remains elevated in the 140/80 range.  Patient had an EKG yesterday for preoperative clearance.  There was background noise and poor R wave progression through the precordial leads.  She has no history of MI.  She has no chest pain or shortness of breath.  She is scheduled for an appendectomy with COVID excision of a potentially malignant polyp in her cecum in the near future.  She says that she is mindful of sodium in her diet.  She has one 8 ounce glass of wine nightly and no other alcohol.  She quit tobacco years and years ago.    Review of Systems  Constitutional: Negative.   HENT: Negative.    Eyes:  Negative for blurred vision, discharge and redness.  Respiratory: Negative.  Negative for shortness of breath.   Cardiovascular: Negative.  Negative for chest pain and palpitations.  Gastrointestinal:  Negative for abdominal pain.  Genitourinary: Negative.   Musculoskeletal: Negative.  Negative for myalgias.  Skin:  Negative for rash.  Neurological:  Negative for tingling, loss of consciousness and weakness.  Endo/Heme/Allergies:  Negative for polydipsia.      Objective:     BP (!) 140/80 (BP Location: Left Arm, Patient Position: Sitting, Cuff Size: Normal)   Pulse 90   Temp (!) 97.1 F (36.2 C) (Temporal)   Ht '5\' 3"'$  (1.6 m)   Wt 163 lb 3.2 oz (74 kg)   SpO2 95%   BMI 28.91 kg/m  BP Readings from Last 3 Encounters:  01/17/22 (!) 140/80  01/16/22 (!) 144/75  01/02/22 (!) 144/87   Wt Readings from Last 3 Encounters:  01/17/22 163  lb 3.2 oz (74 kg)  01/16/22 159 lb (72.1 kg)  01/02/22 157 lb (71.2 kg)      Physical Exam Constitutional:      General: She is not in acute distress.    Appearance: Normal appearance. She is not ill-appearing, toxic-appearing or diaphoretic.  HENT:     Head: Normocephalic and atraumatic.     Right Ear: External ear normal.     Left Ear: External ear normal.  Eyes:     General: No scleral icterus.       Right eye: No discharge.        Left eye: No discharge.     Extraocular Movements: Extraocular movements intact.     Conjunctiva/sclera: Conjunctivae normal.  Cardiovascular:     Rate and Rhythm: Normal rate and regular rhythm.  Pulmonary:     Effort: Pulmonary effort is normal. No respiratory distress.     Breath sounds: Normal breath sounds. No wheezing or rales.  Musculoskeletal:     Cervical back: No rigidity or tenderness.  Skin:    General: Skin is warm and dry.  Neurological:     Mental Status: She is alert and oriented to person, place, and time.  Psychiatric:        Mood and Affect: Mood normal.        Behavior: Behavior normal.      No  results found for any visits on 01/17/22.    The 10-year ASCVD risk score (Arnett DK, et al., 2019) is: 20.4%    Assessment & Plan:   Problem List Items Addressed This Visit       Cardiovascular and Mediastinum   Essential hypertension - Primary   Relevant Medications   carvedilol (COREG) 6.25 MG tablet     Other   Abnormal EKG   Relevant Orders   EKG 12-Lead (Completed)    Return in about 6 weeks (around 02/28/2022).  Repeated EKG that showed normal sinus rhythm with normal R wave progression to the precordial leads.  Continue Benicar 25 mg and chlorthalidone 25 mg.  We will add carvedilol 6.25 mg twice daily.  Information was given on carvedilol  [name], MD

## 2022-01-28 NOTE — Anesthesia Preprocedure Evaluation (Signed)
Anesthesia Evaluation  Patient identified by MRN, date of birth, ID band Patient awake    Reviewed: Allergy & Precautions, NPO status , Patient's Chart, lab work & pertinent test results  Airway Mallampati: III  TM Distance: >3 FB Neck ROM: Full    Dental  (+) Teeth Intact, Dental Advisory Given   Pulmonary asthma , former smoker Quit smoking 2013 Only uses inhalers when sick, asthma well controlled   Pulmonary exam normal breath sounds clear to auscultation       Cardiovascular hypertension, Pt. on medications Normal cardiovascular exam Rhythm:Regular Rate:Normal     Neuro/Psych negative neurological ROS  negative psych ROS   GI/Hepatic Neg liver ROS, hiatal hernia,GERD  Controlled and Medicated,,Appendiceal polyp   Endo/Other  diabetes, Well Controlled, Type 2, Oral Hypoglycemic Agents    Renal/GU negative Renal ROS  negative genitourinary   Musculoskeletal negative musculoskeletal ROS (+)    Abdominal   Peds  Hematology Hb 13.5, plt 269   Anesthesia Other Findings Semaglutide LD: 11/15 (about 2wks)  Reproductive/Obstetrics negative OB ROS                             Anesthesia Physical Anesthesia Plan  ASA: 2  Anesthesia Plan: General   Post-op Pain Management: Tylenol PO (pre-op)*   Induction: Intravenous  PONV Risk Score and Plan: 4 or greater and Ondansetron, Dexamethasone, Midazolam and Treatment may vary due to age or medical condition  Airway Management Planned: Oral ETT  Additional Equipment: None  Intra-op Plan:   Post-operative Plan: Extubation in OR  Informed Consent: I have reviewed the patients History and Physical, chart, labs and discussed the procedure including the risks, benefits and alternatives for the proposed anesthesia with the patient or authorized representative who has indicated his/her understanding and acceptance.     Dental advisory  given  Plan Discussed with: CRNA  Anesthesia Plan Comments:         Anesthesia Quick Evaluation

## 2022-01-29 ENCOUNTER — Other Ambulatory Visit: Payer: Self-pay

## 2022-01-29 ENCOUNTER — Ambulatory Visit (HOSPITAL_COMMUNITY)
Admission: RE | Admit: 2022-01-29 | Discharge: 2022-01-29 | Disposition: A | Discharge status: Home / Self Care | Payer: Medicare Other | Source: Ambulatory Visit | Attending: Surgery | Admitting: Surgery

## 2022-01-29 ENCOUNTER — Encounter (HOSPITAL_COMMUNITY): Payer: Self-pay | Admitting: Surgery

## 2022-01-29 ENCOUNTER — Ambulatory Visit (HOSPITAL_COMMUNITY): Payer: Medicare Other | Admitting: Physician Assistant

## 2022-01-29 ENCOUNTER — Encounter (HOSPITAL_COMMUNITY)
Admission: RE | Disposition: A | Discharge status: Home / Self Care | Payer: Self-pay | Source: Ambulatory Visit | Attending: Surgery

## 2022-01-29 ENCOUNTER — Ambulatory Visit (HOSPITAL_BASED_OUTPATIENT_CLINIC_OR_DEPARTMENT_OTHER): Payer: Medicare Other | Admitting: Physician Assistant

## 2022-01-29 DIAGNOSIS — K449 Diaphragmatic hernia without obstruction or gangrene: Secondary | ICD-10-CM | POA: Insufficient documentation

## 2022-01-29 DIAGNOSIS — J45909 Unspecified asthma, uncomplicated: Secondary | ICD-10-CM | POA: Diagnosis not present

## 2022-01-29 DIAGNOSIS — D121 Benign neoplasm of appendix: Secondary | ICD-10-CM | POA: Diagnosis not present

## 2022-01-29 DIAGNOSIS — E119 Type 2 diabetes mellitus without complications: Secondary | ICD-10-CM | POA: Diagnosis not present

## 2022-01-29 DIAGNOSIS — K388 Other specified diseases of appendix: Secondary | ICD-10-CM | POA: Diagnosis not present

## 2022-01-29 DIAGNOSIS — Z7984 Long term (current) use of oral hypoglycemic drugs: Secondary | ICD-10-CM | POA: Insufficient documentation

## 2022-01-29 DIAGNOSIS — E785 Hyperlipidemia, unspecified: Secondary | ICD-10-CM | POA: Insufficient documentation

## 2022-01-29 DIAGNOSIS — D120 Benign neoplasm of cecum: Secondary | ICD-10-CM | POA: Diagnosis not present

## 2022-01-29 DIAGNOSIS — Z01818 Encounter for other preprocedural examination: Secondary | ICD-10-CM

## 2022-01-29 DIAGNOSIS — K219 Gastro-esophageal reflux disease without esophagitis: Secondary | ICD-10-CM | POA: Insufficient documentation

## 2022-01-29 DIAGNOSIS — I10 Essential (primary) hypertension: Secondary | ICD-10-CM | POA: Diagnosis not present

## 2022-01-29 DIAGNOSIS — Z87891 Personal history of nicotine dependence: Secondary | ICD-10-CM | POA: Diagnosis not present

## 2022-01-29 HISTORY — PX: LAPAROSCOPIC APPENDECTOMY: SHX408

## 2022-01-29 LAB — BASIC METABOLIC PANEL
Anion gap: 10 (ref 5–15)
BUN: 15 mg/dL (ref 8–23)
CO2: 27 mmol/L (ref 22–32)
Calcium: 8.6 mg/dL — ABNORMAL LOW (ref 8.9–10.3)
Chloride: 99 mmol/L (ref 98–111)
Creatinine, Ser: 0.92 mg/dL (ref 0.44–1.00)
GFR, Estimated: >60 mL/min (ref >60)
Glucose, Bld: 194 mg/dL — ABNORMAL HIGH (ref 70–99)
Potassium: 4 mmol/L (ref 3.5–5.1)
Sodium: 136 mmol/L (ref 135–145)

## 2022-01-29 LAB — GLUCOSE, CAPILLARY
Glucose-Capillary: 166 mg/dL — ABNORMAL HIGH (ref 70–99)
Glucose-Capillary: 167 mg/dL — ABNORMAL HIGH (ref 70–99)

## 2022-01-29 LAB — ABO/RH: ABO/RH(D): O NEG

## 2022-01-29 SURGERY — APPENDECTOMY, LAPAROSCOPIC
Anesthesia: General | Site: Abdomen

## 2022-01-29 MED ORDER — CHLORHEXIDINE GLUCONATE CLOTH 2 % EX PADS: 6.0000 | MEDICATED_PAD | Freq: Once | CUTANEOUS | Status: DC

## 2022-01-29 MED ORDER — LACTATED RINGERS IV SOLN: INTRAVENOUS | Status: DC

## 2022-01-29 MED ORDER — BUPIVACAINE LIPOSOME 1.3 % IJ SUSP
INTRAMUSCULAR | Status: DC | PRN
  Administered 2022-01-29: 20 mL

## 2022-01-29 MED ORDER — AMISULPRIDE (ANTIEMETIC) 5 MG/2ML IV SOLN: 10.0000 mg | Freq: Once | INTRAVENOUS | Status: DC | PRN

## 2022-01-29 MED ORDER — BUPIVACAINE LIPOSOME 1.3 % IJ SUSP: 20.0000 mL | Freq: Once | INTRAMUSCULAR | Status: DC

## 2022-01-29 MED ORDER — ACETAMINOPHEN 500 MG PO TABS
1000.0000 mg | ORAL_TABLET | ORAL | Status: AC
  Administered 2022-01-29: 1000 mg via ORAL
  Filled 2022-01-29: qty 2

## 2022-01-29 MED ORDER — FENTANYL CITRATE (PF) 250 MCG/5ML IJ SOLN
INTRAMUSCULAR | Status: AC
  Filled 2022-01-29: qty 5

## 2022-01-29 MED ORDER — KETOROLAC TROMETHAMINE 30 MG/ML IJ SOLN
INTRAMUSCULAR | Status: DC | PRN
  Administered 2022-01-29: 30 mg via INTRAVENOUS

## 2022-01-29 MED ORDER — PHENYLEPHRINE 80 MCG/ML (10ML) SYRINGE FOR IV PUSH (FOR BLOOD PRESSURE SUPPORT)
PREFILLED_SYRINGE | INTRAVENOUS | Status: DC | PRN
  Administered 2022-01-29: 80 ug via INTRAVENOUS
  Administered 2022-01-29: 160 ug via INTRAVENOUS

## 2022-01-29 MED ORDER — SUGAMMADEX SODIUM 200 MG/2ML IV SOLN
INTRAVENOUS | Status: DC | PRN
  Administered 2022-01-29: 100 mg via INTRAVENOUS
  Administered 2022-01-29 (×4): 50 mg via INTRAVENOUS

## 2022-01-29 MED ORDER — BISACODYL 5 MG PO TBEC: 20.0000 mg | DELAYED_RELEASE_TABLET | Freq: Once | ORAL | Status: DC

## 2022-01-29 MED ORDER — HEPARIN SODIUM (PORCINE) 5000 UNIT/ML IJ SOLN
5000.0000 [IU] | Freq: Once | INTRAMUSCULAR | Status: AC
  Administered 2022-01-29: 5000 [IU] via SUBCUTANEOUS
  Filled 2022-01-29: qty 1

## 2022-01-29 MED ORDER — NEOMYCIN SULFATE 500 MG PO TABS: 1000.0000 mg | ORAL_TABLET | ORAL | Status: DC

## 2022-01-29 MED ORDER — LIDOCAINE 2% (20 MG/ML) 5 ML SYRINGE
INTRAMUSCULAR | Status: DC | PRN
  Administered 2022-01-29: 60 mg via INTRAVENOUS

## 2022-01-29 MED ORDER — LACTATED RINGERS IR SOLN
Status: DC | PRN
  Administered 2022-01-29: 1000 mL

## 2022-01-29 MED ORDER — BUPIVACAINE-EPINEPHRINE (PF) 0.25% -1:200000 IJ SOLN
INTRAMUSCULAR | Status: DC | PRN
  Administered 2022-01-29: 30 mL

## 2022-01-29 MED ORDER — PROPOFOL 10 MG/ML IV BOLUS
INTRAVENOUS | Status: AC
  Filled 2022-01-29: qty 20

## 2022-01-29 MED ORDER — METRONIDAZOLE 500 MG PO TABS: 1000.0000 mg | ORAL_TABLET | ORAL | Status: DC

## 2022-01-29 MED ORDER — HYDROMORPHONE HCL 1 MG/ML IJ SOLN: 0.2500 mg | INTRAMUSCULAR | Status: DC | PRN

## 2022-01-29 MED ORDER — OXYCODONE HCL 5 MG PO TABS: 5.0000 mg | ORAL_TABLET | Freq: Once | ORAL | Status: DC | PRN

## 2022-01-29 MED ORDER — BUPIVACAINE-EPINEPHRINE (PF) 0.25% -1:200000 IJ SOLN
INTRAMUSCULAR | Status: AC
  Filled 2022-01-29: qty 30

## 2022-01-29 MED ORDER — MIDAZOLAM HCL 2 MG/2ML IJ SOLN
INTRAMUSCULAR | Status: AC
  Filled 2022-01-29: qty 2

## 2022-01-29 MED ORDER — KETOROLAC TROMETHAMINE 30 MG/ML IJ SOLN
INTRAMUSCULAR | Status: AC
  Filled 2022-01-29: qty 1

## 2022-01-29 MED ORDER — BUPIVACAINE LIPOSOME 1.3 % IJ SUSP
INTRAMUSCULAR | Status: AC
  Filled 2022-01-29: qty 20

## 2022-01-29 MED ORDER — GLYCOPYRROLATE 0.2 MG/ML IJ SOLN
INTRAMUSCULAR | Status: DC | PRN
  Administered 2022-01-29: .1 mg via INTRAVENOUS

## 2022-01-29 MED ORDER — LIDOCAINE HCL (PF) 2 % IJ SOLN
INTRAMUSCULAR | Status: AC
  Filled 2022-01-29: qty 5

## 2022-01-29 MED ORDER — SODIUM CHLORIDE 0.9 % IV SOLN
2.0000 g | INTRAVENOUS | Status: AC
  Administered 2022-01-29: 2 g via INTRAVENOUS
  Filled 2022-01-29: qty 2

## 2022-01-29 MED ORDER — ALVIMOPAN 12 MG PO CAPS
12.0000 mg | ORAL_CAPSULE | ORAL | Status: AC
  Administered 2022-01-29: 12 mg via ORAL
  Filled 2022-01-29: qty 1

## 2022-01-29 MED ORDER — OXYCODONE HCL 5 MG/5ML PO SOLN: 5.0000 mg | Freq: Once | ORAL | Status: DC | PRN

## 2022-01-29 MED ORDER — PROPOFOL 10 MG/ML IV BOLUS
INTRAVENOUS | Status: DC | PRN
  Administered 2022-01-29: 150 mg via INTRAVENOUS

## 2022-01-29 MED ORDER — ROCURONIUM BROMIDE 10 MG/ML (PF) SYRINGE
PREFILLED_SYRINGE | INTRAVENOUS | Status: AC
  Filled 2022-01-29: qty 10

## 2022-01-29 MED ORDER — DEXAMETHASONE SODIUM PHOSPHATE 10 MG/ML IJ SOLN
INTRAMUSCULAR | Status: DC | PRN
  Administered 2022-01-29: 5 mg via INTRAVENOUS

## 2022-01-29 MED ORDER — ROCURONIUM BROMIDE 100 MG/10ML IV SOLN
INTRAVENOUS | Status: DC | PRN
  Administered 2022-01-29: 100 mg via INTRAVENOUS

## 2022-01-29 MED ORDER — MIDAZOLAM HCL 5 MG/5ML IJ SOLN
INTRAMUSCULAR | Status: DC | PRN
  Administered 2022-01-29: 2 mg via INTRAVENOUS

## 2022-01-29 MED ORDER — EPHEDRINE SULFATE-NACL 50-0.9 MG/10ML-% IV SOSY
PREFILLED_SYRINGE | INTRAVENOUS | Status: DC | PRN
  Administered 2022-01-29 (×2): 10 mg via INTRAVENOUS

## 2022-01-29 MED ORDER — ONDANSETRON HCL 4 MG/2ML IJ SOLN
INTRAMUSCULAR | Status: AC
  Filled 2022-01-29: qty 2

## 2022-01-29 MED ORDER — FENTANYL CITRATE (PF) 100 MCG/2ML IJ SOLN
INTRAMUSCULAR | Status: DC | PRN
  Administered 2022-01-29 (×2): 50 ug via INTRAVENOUS

## 2022-01-29 MED ORDER — SODIUM CHLORIDE 0.9 % IR SOLN
Status: DC | PRN
  Administered 2022-01-29: 1000 mL

## 2022-01-29 MED ORDER — POLYETHYLENE GLYCOL 3350 17 GM/SCOOP PO POWD: 1.0000 | Freq: Once | ORAL | Status: DC

## 2022-01-29 MED ORDER — ORAL CARE MOUTH RINSE: 15.0000 mL | Freq: Once | OROMUCOSAL | Status: AC

## 2022-01-29 MED ORDER — ENSURE PRE-SURGERY PO LIQD
592.0000 mL | Freq: Once | ORAL | Status: DC
  Filled 2022-01-29: qty 592

## 2022-01-29 MED ORDER — ENSURE PRE-SURGERY PO LIQD
296.0000 mL | Freq: Once | ORAL | Status: DC
  Filled 2022-01-29: qty 296

## 2022-01-29 MED ORDER — OXYCODONE HCL 5 MG PO TABS: 5.0000 mg | ORAL_TABLET | Freq: Three times a day (TID) | ORAL | 0 refills | Status: AC | PRN

## 2022-01-29 MED ORDER — ONDANSETRON HCL 4 MG/2ML IJ SOLN
INTRAMUSCULAR | Status: DC | PRN
  Administered 2022-01-29 (×2): 4 mg via INTRAVENOUS

## 2022-01-29 MED ORDER — CHLORHEXIDINE GLUCONATE 0.12 % MT SOLN
15.0000 mL | Freq: Once | OROMUCOSAL | Status: AC
  Administered 2022-01-29: 15 mL via OROMUCOSAL

## 2022-01-29 MED ORDER — ONDANSETRON HCL 4 MG/2ML IJ SOLN: 4.0000 mg | Freq: Once | INTRAMUSCULAR | Status: DC | PRN

## 2022-01-29 SURGICAL SUPPLY — 53 items
APPLIER CLIP 5 13 M/L LIGAMAX5 (MISCELLANEOUS) ×1
APPLIER CLIP ROT 10 11.4 M/L (STAPLE)
BAG COUNTER SPONGE SURGICOUNT (BAG) ×1 IMPLANT
CABLE HIGH FREQUENCY MONO STRZ (ELECTRODE) IMPLANT
CHLORAPREP W/TINT 26 (MISCELLANEOUS) ×1 IMPLANT
CLIP APPLIE 5 13 M/L LIGAMAX5 (MISCELLANEOUS) IMPLANT
CLIP APPLIE ROT 10 11.4 M/L (STAPLE) IMPLANT
COVER SURGICAL LIGHT HANDLE (MISCELLANEOUS) ×1 IMPLANT
CUTTER FLEX LINEAR 45M (STAPLE) ×1 IMPLANT
DERMABOND ADVANCED .7 DNX12 (GAUZE/BANDAGES/DRESSINGS) ×1 IMPLANT
DRAIN CHANNEL 19F RND (DRAIN) IMPLANT
ELECT PENCIL ROCKER SW 15FT (MISCELLANEOUS) ×1 IMPLANT
ELECT REM PT RETURN 15FT ADLT (MISCELLANEOUS) ×1 IMPLANT
ENDOLOOP SUT PDS II  0 18 (SUTURE)
ENDOLOOP SUT PDS II 0 18 (SUTURE) IMPLANT
EVACUATOR SILICONE 100CC (DRAIN) IMPLANT
GLOVE BIO SURGEON STRL SZ7.5 (GLOVE) ×1 IMPLANT
GLOVE INDICATOR 8.0 STRL GRN (GLOVE) ×1 IMPLANT
GOWN STRL REUS W/ TWL XL LVL3 (GOWN DISPOSABLE) ×2 IMPLANT
GOWN STRL REUS W/TWL XL LVL3 (GOWN DISPOSABLE) ×2
IRRIG SUCT STRYKERFLOW 2 WTIP (MISCELLANEOUS) ×1
IRRIGATION SUCT STRKRFLW 2 WTP (MISCELLANEOUS) ×1 IMPLANT
KIT BASIN OR (CUSTOM PROCEDURE TRAY) ×1 IMPLANT
KIT TURNOVER KIT A (KITS) IMPLANT
PAD POSITIONING PINK XL (MISCELLANEOUS) ×1 IMPLANT
RELOAD 45 VASCULAR/THIN (ENDOMECHANICALS) IMPLANT
RELOAD STAPLE 45 2.5 WHT GRN (ENDOMECHANICALS) IMPLANT
RELOAD STAPLE 45 3.5 BLU ETS (ENDOMECHANICALS) IMPLANT
RELOAD STAPLE 60 2.6 WHT THN (STAPLE) IMPLANT
RELOAD STAPLE 60 3.6 BLU REG (STAPLE) IMPLANT
RELOAD STAPLE TA45 3.5 REG BLU (ENDOMECHANICALS) IMPLANT
RELOAD STAPLER BLUE 60MM (STAPLE) ×1 IMPLANT
RELOAD STAPLER WHITE 60MM (STAPLE) ×1 IMPLANT
SCISSORS LAP 5X35 DISP (ENDOMECHANICALS) IMPLANT
SEALER TISSUE G2 STRG ARTC 35C (ENDOMECHANICALS) IMPLANT
SET TUBE SMOKE EVAC HIGH FLOW (TUBING) ×1 IMPLANT
SHEARS HARMONIC ACE PLUS 36CM (ENDOMECHANICALS) ×1 IMPLANT
SLEEVE ADV FIXATION 5X100MM (TROCAR) ×1 IMPLANT
SPIKE FLUID TRANSFER (MISCELLANEOUS) ×1 IMPLANT
STAPLER ECHELON LONG 60 440 (INSTRUMENTS) IMPLANT
STAPLER RELOAD BLUE 60MM (STAPLE) ×1
STAPLER RELOAD WHITE 60MM (STAPLE) ×1
SUT ETHILON 3 0 PS 1 (SUTURE) IMPLANT
SUT MNCRL AB 4-0 PS2 18 (SUTURE) ×1 IMPLANT
SYS BAG RETRIEVAL 10MM (BASKET) ×1
SYSTEM BAG RETRIEVAL 10MM (BASKET) ×1 IMPLANT
TOWEL OR 17X26 10 PK STRL BLUE (TOWEL DISPOSABLE) IMPLANT
TOWEL OR NON WOVEN STRL DISP B (DISPOSABLE) ×1 IMPLANT
TRAY FOLEY MTR SLVR 14FR STAT (SET/KITS/TRAYS/PACK) ×1 IMPLANT
TRAY FOLEY MTR SLVR 16FR STAT (SET/KITS/TRAYS/PACK) ×1 IMPLANT
TRAY LAPAROSCOPIC (CUSTOM PROCEDURE TRAY) ×1 IMPLANT
TROCAR ADV FIXATION 5X100MM (TROCAR) ×1 IMPLANT
TROCAR BALLN 12MMX100 BLUNT (TROCAR) ×1 IMPLANT

## 2022-01-29 NOTE — Anesthesia Procedure Notes (Signed)
Procedure Name: Intubation Date/Time: 01/29/2022 7:52 AM  Performed by: Gwyndolyn Saxon, CRNAPre-anesthesia Checklist: Patient identified, Emergency Drugs available, Suction available and Patient being monitored Patient Re-evaluated:Patient Re-evaluated prior to induction Oxygen Delivery Method: Circle system utilized Preoxygenation: Pre-oxygenation with 100% oxygen Induction Type: IV induction Ventilation: Mask ventilation without difficulty Laryngoscope Size: Miller and 2 Grade View: Grade I Tube type: Oral Tube size: 7.0 mm Number of attempts: 1 Airway Equipment and Method: Stylet Placement Confirmation: ETT inserted through vocal cords under direct vision, positive ETCO2 and breath sounds checked- equal and bilateral Secured at: 20 cm Tube secured with: Tape Dental Injury: Teeth and Oropharynx as per pre-operative assessment

## 2022-01-29 NOTE — Op Note (Signed)
Alejandra Hess [id number]   PRE-OPERATIVE DIAGNOSIS:  Appendiceal orifice polyp  POST-OPERATIVE DIAGNOSIS:  Same  PROCEDURE: Laparoscopic appendectomy with partial cecectomy (cuff of cecum)  SURGEON:  Sharon Mt. Dema Severin, M.D.  ASSISTANT: Sheldon Silvan, MD  ANESTHESIA: General endotracheal  EBL:   10 mL  DRAINS: None  SPECIMEN:  Appendix with associated cuff of cecum  COUNTS:  Sponge, needle and instrument counts were reported correct x2 at conclusion of the operation  DISPOSITION:  PACU in satisfactory condition  COMPLICATIONS: None  FINDINGS: Diminutive appendix. Normal appearing omentum, terminal ileum, cecum and ascending colon. Appendectomy carried out including associated cuff of cecum. Specimen opened on back table to confirm no evident polyp at the staple line - no clear involvement was seen.  DESCRIPTION:  The patient was identified & brought into the operating room. SCDs were in place and functioning. General endotracheal anesthesia was administered. Preoperative antibiotics were administered. She was positioned supine with left arm tucked. Hair on the abdomen was then clipped by the OR team. A foley catheter was inserted under sterile conditions. The abdomen was prepped and draped in the standard sterile fashion. A surgical timeout confirmed our plan.  A small incision was made in the supraumbilical skin. The subcutaneous tissue was dissected and the umbilical stalk identified. The stalk was grasped with a Kocher and retracted outwardly. The supraumbilical fascia was exposed and incised. Peritoneal entry was carefully made bluntly. A 0 Vicryl purse-string suture was placed and then the [facility] port was introduced into the abdomen.  CO2 insufflation commenced to 77mHg. The laparoscope was inserted and confirmed no evidence of trocar site complications. The patient was then positioned in Trendelenburg. Two additional ports were placed in left lower quadrant under direct  visualization. The bed was then slightly tilted to place the left side down.  The omentum was reflected cephalad.  The small bowel was then positioned such that the cecum and appendix were readily visualized.  The appendix is diminutive in appearance.  There is no gross full-thickness involvement of any sort of polyp or mass.  The terminal ileum, cecum, and ascending colon are all normal in appearance.  We began by developing a window at the base of the appendix along the cecum.  The intervening mesentery was then ligated and divided using the laparoscopic Enseal device.  We are able to mobilize/free of very small amount of associated cecal mesentery to the degree necessary to carry out at least a partial cecectomy.  The terminal ileum is reevaluated.  Were able to confirm the location of the ileocecal valve such that were able to carry out this removal of the cuff of cecum without narrowing the terminal ileum/ICV.  Cut edge of the mesentery is noted to be hemostatic.  A 60 mm laparoscopic powered Echelon stapler was then utilized with a blue load to take the cuff of cecum and appendix and 1 unit.  There was a small amount of tissue remaining after firing the stapler that was then divided using a Reiko Vinje load of the Echelon stapler.  In doing so, the appendix and associated cuff of cecum were fully removed.  Towards the end of the Azaria Bartell load staple line there was some oozing that was controlled with a single 5 mm clip.  The right lower quadrant was irrigated and hemostasis is verified.  The specimen is then placed into an Endo Catch bag.  This is then removed from the umbilical trocar site.  I would to the back table and opened the specimen  by excising a small portion of the staple line to evaluate the appendiceal orifice.  There is some faint polypoid like material at the appendiceal orifice but it appears well clear of our staple line.  I then scrubbed back in.  The right lower quadrant was again reinspected  and noted to be hemostatic.  The staples are well-formed without any disruptions.  The omentum was returned to its anatomic position.  The left lower quadrant ports were removed under direct visualization and hemostatic. The CO2 was exhausted from the abdomen. The umbilical fascia was then closed by closing the 0 Vicryl suture. The fascia was palpated and noted to be completely closed. The skin of all port sites was then approximated using 4-0 Monocryl suture. The incisions were covered with Dermabond.  She was then awakened from general anesthesia, extubated, and transferred to a stretcher for transport to recovery in satisfactory condition.

## 2022-01-29 NOTE — Transfer of Care (Signed)
Immediate Anesthesia Transfer of Care Note  Patient: Alejandra Hess  Procedure(s) Performed: LAPAROSCOPIC APPENDECTOMY (Abdomen)  Patient Location: PACU  Anesthesia Type:General  Level of Consciousness: drowsy and patient cooperative  Airway & Oxygen Therapy: Patient Spontanous Breathing  Post-op Assessment: Report given to RN and Post -op Vital signs reviewed and stable  Post vital signs: Reviewed and stable  Last Vitals:  Vitals Value Taken Time  BP 118/69 01/29/22 0908  Temp    Pulse 63 01/29/22 0911  Resp 17 01/29/22 0911  SpO2 97 % 01/29/22 0911  Vitals shown include unvalidated device data.  Last Pain:  Vitals:   01/29/22 0555  TempSrc: Oral         Complications: No notable events documented.

## 2022-01-29 NOTE — Discharge Instructions (Addendum)
POST OP INSTRUCTIONS  DIET: As tolerated. Follow a light bland diet the first 24 hours after arrival home, such as soup, liquids, crackers, etc.  Be sure to include lots of fluids daily.  Avoid fast food or heavy meals as your are more likely to get nauseated.  Eat a low fat the next few days after surgery.  Take your usually prescribed home medications unless otherwise directed.  PAIN CONTROL: Pain is best controlled by a usual combination of three different methods TOGETHER: Ice/Heat Over the counter pain medication Prescription pain medication Most patients will experience some swelling and bruising around the surgical site.  Ice packs or heating pads (30-60 minutes up to 6 times a day) will help. Some people prefer to use ice alone, heat alone, alternating between ice & heat.  Experiment to what works for you.  Swelling and bruising can take several weeks to resolve.   It is helpful to take an over-the-counter pain medication regularly for the first few weeks: Ibuprofen (Motrin/Advil) - '200mg'$  tabs - take 3 tabs ('600mg'$ ) every 6 hours as needed for pain Acetaminophen (Tylenol) - you may take '650mg'$  every 6 hours as needed. You can take this with motrin as they act differently on the body. If you are taking a narcotic pain medication that has acetaminophen in it, do not take over the counter tylenol at the same time.  Iii. NOTE: You may take both of these medications together - most patients  find it most helpful when alternating between the two (i.e. Ibuprofen at 6am,  tylenol at 9am, ibuprofen at 12pm ..[name]) A  prescription for pain medication should be given to you upon discharge.  Take your pain medication as prescribed if your pain is not adequatly controlled with the over-the-counter pain reliefs mentioned above.  Avoid getting constipated.  Between the surgery and the pain medications, it is common to experience some constipation.  Increasing fluid intake and taking a fiber supplement (such as  Metamucil, Citrucel, FiberCon, MiraLax, etc) 1-2 times a day regularly will usually help prevent this problem from occurring.  A mild laxative (prune juice, Milk of Magnesia, MiraLax, etc) should be taken according to package directions if there are no bowel movements after 48 hours.    Dressing: Your incision are covered in Dermabond which is like sterile superglue for the skin. This will come off on it's own in a couple weeks. It is waterproof and you may bathe normally starting the day after your surgery in a shower. Avoid baths/pools/lakes/oceans until your wounds have fully healed.  ACTIVITIES as tolerated:   Avoid heavy lifting (>10lbs or 1 gallon of milk) for the next 6 weeks. You may resume regular (light) daily activities beginning the next day--such as daily self-care, walking, climbing stairs--gradually increasing activities as tolerated.  If you can walk 30 minutes without difficulty, it is safe to try more intense activity such as jogging, treadmill, bicycling, low-impact aerobics.  DO NOT PUSH THROUGH PAIN.  Let pain be your guide: If it hurts to do something, don't do it. You may drive when you are no longer taking prescription pain medication, you can comfortably wear a seatbelt, and you can safely maneuver your car and apply brakes.   FOLLOW UP in our office Please call [facility] at [phone] to set up an appointment to see your surgeon in the office for a follow-up appointment approximately 2 weeks after your surgery. Make sure that you call for this appointment the day you arrive home to  insure a convenient appointment time.  9. If you have disability or family leave forms that need to be completed, you may have them completed by your primary care physician's office; for return to work instructions, please ask our office staff and they will be happy to assist you in obtaining this documentation   When to call us [phone]: Poor pain control Reactions / problems with new  medications (rash/itching, etc)  Fever over 101.5 F (38.5 C) Inability to urinate Nausea/vomiting Worsening swelling or bruising Continued bleeding from incision. Increased pain, redness, or drainage from the incision  The clinic staff is available to answer your questions during regular business hours (8:30am-5pm).  Please don't hesitate to call and ask to speak to one of our nurses for clinical concerns.   A surgeon from [facility] is always on call at the hospitals   If you have a medical emergency, go to the nearest emergency room or call 911.  [facility] [address], [address], [place], [place]  [address] MAIN: [phone] FAX: [phone] [email or web address]  

## 2022-01-29 NOTE — H&P (Signed)
CC: Here today for surgery  HPI: Alejandra Hess is an 66 y.o. female with history of HTN, HLD, DM, whom is seen in the office 12/19/21 as a referral by Dr. Bryan Lemma for evaluation of appendiceal orifice polyp.  She underwent colonoscopy with Dr. Bryan Lemma 10/27/2021:  - One 8 mm polyp at the appendiceal orifice, removed with a cold snare. (jar 4). After cold snare resection, appeared to haev additional adenomatous tissue invading into the appendiceal orifice. The appendix was inverted using cold forceps, with confirmation of suspected additional adenomatous tissue. This was resected using cold forceps (jar 5). However, I suspect there is additional adenomatous tissue still deep to the resected margins - Two 2 to 3 mm polyps in the transverse colon and in the ascending colon, removed with a cold snare. Resected and retrieved. - Non-bleeding internal hemorrhoids.  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  Referred to see [place] for follow-up. She denies any complaints at present. States she is ready for surgery. Tolerated bowel prep with satisfactory result.   Past Medical History:  Diagnosis Date   Allergy    Asthma    COVID-19    DM (diabetes mellitus) (HCC)    GERD (gastroesophageal reflux disease)    Heart murmur    as a child   HLD (hyperlipidemia)    HSV-1 infection    Hypertension    Pneumonia     Past Surgical History:  Procedure Laterality Date   42 HOUR [place] STUDY N/A 11/18/2019   Procedure: 24 HOUR PH STUDY;  Surgeon: Irving Copas., MD;  Location: [facility];  Service: Gastroenterology;  Laterality: N/A;   ABDOMINAL HYSTERECTOMY     AUGMENTATION MAMMAPLASTY Bilateral    @ 43 lift with implants   BREAST EXCISIONAL BIOPSY Right    @ 53   BREAST EXCISIONAL BIOPSY Left    '@43'$ ?   CHOLECYSTECTOMY     COLONOSCOPY     ESOPHAGEAL MANOMETRY N/A 11/18/2019   Procedure: ESOPHAGEAL MANOMETRY (EM);  Surgeon:  Irving Copas., MD;  Location: [facility];  Service: Gastroenterology;  Laterality: N/A;   UPPER GASTROINTESTINAL ENDOSCOPY      Family History  Problem Relation Age of Onset   Hearing loss Mother    Hyperlipidemia Mother    Hypertension Mother    Diabetes Mother    Heart attack Father    Heart disease Father    Hyperlipidemia Father    Hypertension Father    Melanoma Father    Asthma Sister    Hyperlipidemia Sister    Hypertension Sister    Hyperlipidemia Sister    Breast cancer Sister 67   Asthma Sister    Depression Sister    Heart attack Sister    Heart disease Sister    Hypertension Sister    Diabetes Sister    Breast cancer Maternal Aunt        over 27   Breast cancer Paternal Aunt        over 25   Breast cancer Paternal Grandmother        over 28    Breast cancer Maternal Aunt        over 34    Colon cancer Maternal Grandfather    Esophageal cancer Neg Hx    Inflammatory bowel disease Neg Hx    Liver disease Neg Hx    Pancreatic cancer Neg Hx    Rectal cancer Neg Hx    Stomach cancer Neg Hx  Social:  reports that she quit smoking about 10 years ago. Her smoking use included cigarettes. She has never used smokeless tobacco. She reports current alcohol use. She reports that she does not use drugs.  Allergies:  Allergies  Allergen Reactions   Azithromycin Rash   Jardiance [Empagliflozin] Other (See Comments)    Yeast vaginitis   Penicillins Hives and Rash    Medications: I have reviewed the patient's current medications.  Results for orders placed or performed during the hospital encounter of 01/29/22 (from the past 48 hour(s))  Glucose, capillary     Status: Abnormal   Collection Time: 01/29/22  5:54 AM  Result Value Ref Range   Glucose-Capillary 166 (H) 70 - 99 mg/dL    Comment: Glucose reference range applies only to samples taken after fasting for at least 8 hours.   Comment 1 Notify RN     No results found.  ROS - all of the  below systems have been reviewed with the patient and positives are indicated with bold text General: chills, fever or night sweats Eyes: blurry vision or double vision ENT: epistaxis or sore throat Allergy/Immunology: itchy/watery eyes or nasal congestion Hematologic/Lymphatic: bleeding problems, blood clots or swollen lymph nodes Endocrine: temperature intolerance or unexpected weight changes Breast: new or changing breast lumps or nipple discharge Resp: cough, shortness of breath, or wheezing CV: chest pain or dyspnea on exertion GI: as per HPI GU: dysuria, trouble voiding, or hematuria MSK: joint pain or joint stiffness Neuro: TIA or stroke symptoms Derm: pruritus and skin lesion changes Psych: anxiety and depression  PE Blood pressure 109/71, pulse 67, temperature 98.1 F (36.7 C), temperature source Oral, resp. rate 16, height '5\' 3"'$  (1.6 m), weight 74 kg, SpO2 93 %. Constitutional: NAD; conversant Eyes: Moist conjunctiva Lungs: Normal respiratory effort CV: RRR GI: Abd soft, NT/ND MSK: Normal range of motion of extremities Psychiatric: Appropriate affect; alert and oriented x3   Results for orders placed or performed during the hospital encounter of 01/29/22 (from the past 48 hour(s))  Glucose, capillary     Status: Abnormal   Collection Time: 01/29/22  5:54 AM  Result Value Ref Range   Glucose-Capillary 166 (H) 70 - 99 mg/dL    Comment: Glucose reference range applies only to samples taken after fasting for at least 8 hours.   Comment 1 Notify RN     No results found.  A/P: Alejandra Hess is an 67 y.o. 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 The pathophysiology of colon polyps was discussed as well with associated pictures. -We have discussed various different  treatment options going forward including surgery (the most definitive) to address this -laparoscopic appendectomy; scenarios where an ileocecectomy/right hemicolectomy may be necessary. I suspect based on the endoscopic photos, laparoscopic appendectomy with a cuff of cecum should render this with a negative margin.  -The planned procedure, material risks (including, but not limited to, pain, bleeding, infection, scarring, need for blood transfusion, damage to surrounding structures- blood vessels/nerves/viscus/organs, need for additional procedures, worsening of pre-existing medical conditions, hernia, recurrence, uncommon scenarios were other things can occur such as leak from our connections/staple lines which could even require ostomy; pneumonia, heart attack, stroke, death) benefits and alternatives to surgery were discussed at length. The patient's questions were answered to her satisfaction, she voiced understanding  and elected to proceed with surgery. Additionally, we discussed typical postoperative expectations and the recovery process.   Nadeen Landau, [facility], [place]

## 2022-01-30 ENCOUNTER — Encounter (HOSPITAL_COMMUNITY): Payer: Self-pay | Admitting: Surgery

## 2022-01-30 LAB — SURGICAL PATHOLOGY

## 2022-01-30 NOTE — Anesthesia Postprocedure Evaluation (Signed)
Anesthesia Post Note  Patient: Alejandra Hess  Procedure(s) Performed: LAPAROSCOPIC APPENDECTOMY (Abdomen)     Patient location during evaluation: PACU Anesthesia Type: General Level of consciousness: awake and alert, oriented and patient cooperative Pain management: pain level controlled Vital Signs Assessment: post-procedure vital signs reviewed and stable Respiratory status: spontaneous breathing, nonlabored ventilation and respiratory function stable Cardiovascular status: blood pressure returned to baseline and stable Postop Assessment: no apparent nausea or vomiting Anesthetic complications: no   No notable events documented.  Last Vitals:  Vitals:   01/29/22 1002 01/29/22 1003  BP: 110/63   Pulse: 63   Resp:    Temp:    SpO2:  93%    Last Pain:  Vitals:   01/29/22 1014  TempSrc:   PainSc: 2    Pain Goal: Patients Stated Pain Goal: 0 (01/29/22 1014)                 Pervis Hocking

## 2022-01-31 ENCOUNTER — Telehealth: Payer: Self-pay

## 2022-01-31 NOTE — Telephone Encounter (Signed)
Transition Care Management Follow-up Telephone Call Date of discharge and from where: 01/29/22 [facility]. Dx: Appendectomy How have you been since you were released from the hospital? I'm doing much better Any questions or concerns? No  Items Reviewed: Did the pt receive and understand the discharge instructions provided? Yes  Medications obtained and verified? Yes  Other? No  Any new allergies since your discharge? No  Dietary orders reviewed? Yes Do you have support at home? Yes   Home Care and Equipment/Supplies: Were home health services ordered? not applicable If so, what is the name of the agency? N/a  Has the agency set up a time to come to the patient's home? not applicable Were any new equipment or medical supplies ordered?  No What is the name of the medical supply agency? N/a Were you able to get the supplies/equipment? not applicable Do you have any questions related to the use of the equipment or supplies? No  Functional Questionnaire: (I = Independent and D = Dependent) ADLs: I  Bathing/Dressing- I  Meal Prep- I  Eating- I  Maintaining continence- I  Transferring/Ambulation- I  Managing Meds- I  Follow up appointments reviewed:  PCP Hospital f/u appt confirmed? No  Scheduled to see N/A on N/A @ N/A. [facility] f/u appt confirmed? Yes  Scheduled to see Dr. Nadeen Landau on 1/xx/2024 @ xx:xx. Are transportation arrangements needed? No  If their condition worsens, is the pt aware to call PCP or go to the Emergency Dept.? Yes Was the patient provided with contact information for the PCP's office or ED? Yes Was to pt encouraged to call back with questions or concerns? Yes  Angeline Slim, RN, BSN RN Clinical Supervisor [facility] [employer]

## 2022-02-09 ENCOUNTER — Ambulatory Visit (INDEPENDENT_AMBULATORY_CARE_PROVIDER_SITE_OTHER): Payer: Medicare Other | Admitting: Family Medicine

## 2022-02-09 ENCOUNTER — Encounter: Payer: Self-pay | Admitting: Family Medicine

## 2022-02-09 VITALS — BP 110/66 | HR 80 | Temp 97.1°F | Ht 63.0 in | Wt 157.6 lb

## 2022-02-09 DIAGNOSIS — T887XXA Unspecified adverse effect of drug or medicament, initial encounter: Secondary | ICD-10-CM

## 2022-02-09 DIAGNOSIS — E119 Type 2 diabetes mellitus without complications: Secondary | ICD-10-CM | POA: Diagnosis not present

## 2022-02-09 DIAGNOSIS — E663 Overweight: Secondary | ICD-10-CM | POA: Diagnosis not present

## 2022-02-09 DIAGNOSIS — I10 Essential (primary) hypertension: Secondary | ICD-10-CM | POA: Diagnosis not present

## 2022-02-09 MED ORDER — TIRZEPATIDE 5 MG/0.5ML ~~LOC~~ SOAJ: 5.0000 mg | SUBCUTANEOUS | 1 refills | Status: DC

## 2022-02-09 NOTE — Progress Notes (Signed)
Established Patient Office Visit   Subjective:  Patient ID: Alejandra Hess, female    DOB: 02-Nov-1955  Age: 66 y.o. MRN: [id number]  No chief complaint on file.   HPI Encounter Diagnoses  Name Primary?   Type 2 diabetes mellitus without complication, without long-term current use of insulin (HCC) Yes   Essential hypertension    Overweight (BMI 25.0-29.9)    Medication side effect    For follow-up of hypertension and medication side effect with semaglutide.  Has noted increased reflux with semaglutide.  She has not developed nausea.  She does not carbohydrate load.  It has otherwise worked well for her.  Recent hemoglobin A1c was 6.7.  Blood pressure is now quite well-controlled with the addition of carvedilol 6.25 twice daily in addition to olmesartan 40 mg and chlorthalidone 25 she is recovering well after her recent surgery precancerous lesion in her cecum.   Review of Systems  Constitutional: Negative.   HENT: Negative.    Eyes:  Negative for blurred vision, discharge and redness.  Respiratory: Negative.    Cardiovascular: Negative.   Gastrointestinal:  Negative for abdominal pain.  Genitourinary: Negative.   Musculoskeletal: Negative.  Negative for myalgias.  Skin:  Negative for rash.  Neurological:  Negative for tingling, loss of consciousness and weakness.  Endo/Heme/Allergies:  Negative for polydipsia.     Current Outpatient Medications:    albuterol (PROVENTIL) (2.5 MG/3ML) 0.083% nebulizer solution, Take 2.5 mg by nebulization every 6 (six) hours as needed for shortness of breath or wheezing. PRN, Disp: , Rfl: 7   albuterol (VENTOLIN HFA) 108 (90 Base) MCG/ACT inhaler, Inhale 1-2 puffs into the lungs every 6 (six) hours as needed for wheezing or shortness of breath., Disp: , Rfl:    budesonide-formoterol (SYMBICORT) 160-4.5 MCG/ACT inhaler, Inhale 1 puff into the lungs at bedtime as needed., Disp: 1 Inhaler, Rfl: 3   carvedilol (COREG) 6.25 MG tablet, Take 1 tablet  (6.25 mg total) by mouth 2 (two) times daily with a meal., Disp: 60 tablet, Rfl: 3   cetirizine (ZYRTEC) 10 MG tablet, Take 10 mg by mouth daily., Disp: , Rfl:    chlorthalidone (HYGROTON) 25 MG tablet, Take 1 tablet (25 mg total) by mouth daily., Disp: 30 tablet, Rfl: 2   glucose blood (ONETOUCH VERIO) test strip, Check blood sugar before 3 meals and at bedtime., Disp: 100 each, Rfl: 12   Lancets (ONETOUCH DELICA PLUS [id number]) MISC, 1 each by Does not apply route in the morning, at noon, in the evening, and at bedtime., Disp: 100 each, Rfl: 3   magnesium oxide (MAG-OX) 400 MG tablet, Take 400 mg by mouth daily., Disp: , Rfl:    metFORMIN (GLUCOPHAGE-XR) 750 MG 24 hr tablet, TAKE ONE TABLET BY MOUTH TWICE A DAY AFTER A MEAL, Disp: 180 tablet, Rfl: 1   Multiple Vitamin (MULTIVITAMIN) tablet, Take 1 tablet by mouth daily., Disp: , Rfl:    olmesartan (BENICAR) 40 MG tablet, Take 1 tablet (40 mg total) by mouth daily., Disp: 90 tablet, Rfl: 1   pantoprazole (PROTONIX) 40 MG tablet, TAKE ONE TABLET BY MOUTH ONE TIME DAILY, Disp: 90 tablet, Rfl: 1   rosuvastatin (CRESTOR) 20 MG tablet, TAKE 1 TABLET(20 MG) BY MOUTH DAILY., Disp: 90 tablet, Rfl: 3   tirzepatide (MOUNJARO) 5 MG/0.5ML Pen, Inject 5 mg into the skin once a week., Disp: 6 mL, Rfl: 1   triamcinolone (NASACORT) 55 MCG/ACT AERO nasal inhaler, Place 1 spray into the nose daily., Disp: , Rfl:  valACYclovir (VALTREX) 1000 MG tablet, Take 1/2 tablet 2 times daily for 3 days as needed for outbreaks., Disp: 9 tablet, Rfl: 0   venlafaxine XR (EFFEXOR-XR) 75 MG 24 hr capsule, TAKE ONE CAPSULE BY MOUTH ONE TIME DAILY WITH BREAKFAST, Disp: 30 capsule, Rfl: 5   Objective:     BP 110/66 (BP Location: Left Arm, Patient Position: Sitting, Cuff Size: Normal)   Pulse 80   Temp (!) 97.1 F (36.2 C) (Temporal)   Ht '5\' 3"'$  (1.6 m)   Wt 157 lb 9.6 oz (71.5 kg)   SpO2 96%   BMI 27.92 kg/m    Physical Exam Constitutional:      General: She is not in  acute distress.    Appearance: Normal appearance. She is not ill-appearing, toxic-appearing or diaphoretic.  HENT:     Head: Normocephalic and atraumatic.     Right Ear: External ear normal.     Left Ear: External ear normal.  Eyes:     General: No scleral icterus.       Right eye: No discharge.        Left eye: No discharge.     Extraocular Movements: Extraocular movements intact.     Conjunctiva/sclera: Conjunctivae normal.  Cardiovascular:     Rate and Rhythm: Normal rate and regular rhythm.  Pulmonary:     Effort: Pulmonary effort is normal. No respiratory distress.     Breath sounds: Normal breath sounds.  Abdominal:     General: Bowel sounds are normal.  Musculoskeletal:     Cervical back: No rigidity or tenderness.  Skin:    General: Skin is warm and dry.  Neurological:     Mental Status: She is alert and oriented to person, place, and time.  Psychiatric:        Mood and Affect: Mood normal.        Behavior: Behavior normal.      No results found for any visits on 02/09/22.    The 10-year ASCVD risk score (Arnett DK, et al., 2019) is: 13.1%    Assessment & Plan:   Type 2 diabetes mellitus without complication, without long-term current use of insulin ([place]) -     Tirzepatide; Inject 5 mg into the skin once a week.  Dispense: 6 mL; Refill: 1  Essential hypertension  Overweight (BMI 25.0-29.9) -     Tirzepatide; Inject 5 mg into the skin once a week.  Dispense: 6 mL; Refill: 1  Medication side effect    Return in about 3 months (around 05/11/2022).  Continue blood pressure medications as above.  Discontinue semaglutide.  Start tirzepatide 5 mg weekly.  Information was given on this medication.  Hopefully that will be less indigestion with it.  She is aware of rare instances of intestinal obstruction or pancreatitis.  She will let me know if she develops any severe abdominal pain or proceed to the emergency room.  [name], MD

## 2022-02-12 ENCOUNTER — Telehealth: Payer: Self-pay

## 2022-02-12 NOTE — Telephone Encounter (Signed)
Patient Advocate Encounter   Received notification from [facility] that prior authorization for Darcel Bayley is required.   PA submitted on 02/12/2022 Key [id number] Status is pending       Joneen Boers, [place] Patient Advocate Specialist [place] Patient Advocate Team Direct Number: [phone] Fax: [phone]

## 2022-02-12 NOTE — Telephone Encounter (Signed)
Pharmacy Patient Advocate Encounter  Prior Authorization for Mounjaro '5MG'$ /0.5ML pen-injectors has been approved.    PA# [id number]  Effective dates: 01/29/2022 until further notice   Spoke with Pharmacy to process.   This drug has been approved under the Member's Medicare Part D benefit. Approved  quantity: 6 units per 84 day(s). You may fill up to a 90 day supply except for those on  Specialty Tier 5, which can be filled up to a 30 day supply.  [facility] Patient Advocate

## 2022-02-13 ENCOUNTER — Ambulatory Visit (INDEPENDENT_AMBULATORY_CARE_PROVIDER_SITE_OTHER): Payer: Medicare Other

## 2022-02-13 VITALS — Ht 64.0 in | Wt 154.0 lb

## 2022-02-13 DIAGNOSIS — Z0000 Encounter for general adult medical examination without abnormal findings: Secondary | ICD-10-CM

## 2022-02-13 NOTE — Progress Notes (Signed)
I connected with Alejandra Hess today by telephone and verified that I am speaking with the correct person using two identifiers. Location patient: home Location provider: work Persons participating in the virtual visit: Katee, [place] LPN.   I discussed the limitations, risks, security and privacy concerns of performing an evaluation and management service by telephone and the availability of in person appointments. I also discussed with the patient that there may be a patient responsible charge related to this service. The patient expressed understanding and verbally consented to this telephonic visit.    Interactive audio and video telecommunications were attempted between this provider and patient, however failed, due to patient having technical difficulties OR patient did not have access to video capability.  We continued and completed visit with audio only.     Vital signs may be patient reported or missing.  Subjective:   Alejandra Hess is a 66 y.o. female who presents for Medicare Annual (Subsequent) preventive examination.  Review of Systems     Cardiac Risk Factors include: advanced age (>57mn, >>61women);diabetes mellitus;hypertension     Objective:    Today's Vitals   02/13/22 1011  Weight: 154 lb (69.9 kg)  Height: _0  (1.626 m)   Body mass index is 26.43 kg/m.     02/13/2022   10:14 AM 01/29/2022    6:10 AM 01/16/2022   10:57 AM 02/08/2021   10:39 AM 09/21/2020    1:09 PM 12/08/2019   10:37 AM  Advanced Directives  Does Patient Have a Medical Advance Directive? _1  Yes  Type of AParamed[place]of [place]Living will [place]Living will Living will;Healthcare Power of [place] will [place]um[place] will [place]Living will  Does patient want to make changes to medical advance directive?  No - Patient  declined   No - Patient declined   Copy of [place] Chart? No - copy requested No - copy requested  No - copy requested  No - copy requested    Current Medications (verified) Outpatient Encounter Medications as of 02/13/2022  Medication Sig   albuterol (PROVENTIL) (2.5 MG/3ML) 0.083% nebulizer solution Take 2.5 mg by nebulization every 6 (six) hours as needed for shortness of breath or wheezing. PRN   albuterol (VENTOLIN HFA) 108 (90 Base) MCG/ACT inhaler Inhale 1-2 puffs into the lungs every 6 (six) hours as needed for wheezing or shortness of breath.   budesonide-formoterol (SYMBICORT) 160-4.5 MCG/ACT inhaler Inhale 1 puff into the lungs at bedtime as needed.   carvedilol (COREG) 6.25 MG tablet Take 1 tablet (6.25 mg total) by mouth 2 (two) times daily with a meal.   cetirizine (ZYRTEC) 10 MG tablet Take 10 mg by mouth daily.   chlorthalidone (HYGROTON) 25 MG tablet Take 1 tablet (25 mg total) by mouth daily.   glucose blood (ONETOUCH VERIO) test strip Check blood sugar before 3 meals and at bedtime.   Lancets (ONETOUCH DELICA PLUS [id number] MISC 1 each by Does not apply route in the morning, at noon, in the evening, and at bedtime.   magnesium oxide (MAG-OX) 400 MG tablet Take 400 mg by mouth daily.   metFORMIN (GLUCOPHAGE-XR) 750 MG 24 hr tablet TAKE ONE TABLET BY MOUTH TWICE A DAY AFTER A MEAL   Multiple Vitamin (MULTIVITAMIN) tablet Take 1 tablet by mouth daily.   olmesartan (BENICAR) 40 MG tablet Take 1 tablet (40 mg total) by mouth daily.  pantoprazole (PROTONIX) 40 MG tablet TAKE ONE TABLET BY MOUTH ONE TIME DAILY   rosuvastatin (CRESTOR) 20 MG tablet TAKE 1 TABLET(20 MG) BY MOUTH DAILY.   tirzepatide [facility]) 5 MG/0.5ML Pen Inject 5 mg into the skin once a week.   triamcinolone (NASACORT) 55 MCG/ACT AERO nasal inhaler Place 1 spray into the nose daily.   valACYclovir (VALTREX) 1000 MG tablet Take 1/2 tablet 2 times daily for 3 days as needed for outbreaks.    venlafaxine XR (EFFEXOR-XR) 75 MG 24 hr capsule TAKE ONE CAPSULE BY MOUTH ONE TIME DAILY WITH BREAKFAST   No facility-administered encounter medications on file as of 02/13/2022.    Allergies (verified) Azithromycin, Jardiance [empagliflozin], and Penicillins   History: Past Medical History:  Diagnosis Date   Allergy    Asthma    COVID-19    DM (diabetes mellitus) ([place])    GERD (gastroesophageal reflux disease)    Heart murmur    as a child   HLD (hyperlipidemia)    HSV-1 infection    Hypertension    Pneumonia    Past Surgical History:  Procedure Laterality Date   27 HOUR [place] STUDY N/A 11/18/2019   Procedure: 24 HOUR PH STUDY;  Surgeon: Irving Copas., MD;  Location: [facility];  Service: Gastroenterology;  Laterality: N/A;   ABDOMINAL HYSTERECTOMY     AUGMENTATION MAMMAPLASTY Bilateral    @ 43 lift with implants   BREAST EXCISIONAL BIOPSY Right    @ 53   BREAST EXCISIONAL BIOPSY Left    _0 ?   CHOLECYSTECTOMY     COLONOSCOPY     ESOPHAGEAL MANOMETRY N/A 11/18/2019   Procedure: ESOPHAGEAL MANOMETRY (EM);  Surgeon: Irving Copas., MD;  Location: [facility];  Service: Gastroenterology;  Laterality: N/A;   LAPAROSCOPIC APPENDECTOMY N/A 01/29/2022   Procedure: LAPAROSCOPIC APPENDECTOMY;  Surgeon: Ileana Roup, MD;  Location: [facility];  Service: General;  Laterality: N/A;   UPPER GASTROINTESTINAL ENDOSCOPY     Family History  Problem Relation Age of Onset   Hearing loss Mother    Hyperlipidemia Mother    Hypertension Mother    Diabetes Mother    Heart attack Father    Heart disease Father    Hyperlipidemia Father    Hypertension Father    Melanoma Father    Asthma Sister    Hyperlipidemia Sister    Hypertension Sister    Hyperlipidemia Sister    Breast cancer Sister 60   Asthma Sister    Depression Sister    Heart attack Sister    Heart disease Sister    Hypertension Sister    Diabetes Sister    Breast cancer Maternal Aunt         over 50   Breast cancer Paternal Aunt        over 67   Breast cancer Paternal Grandmother        over 78    Breast cancer Maternal Aunt        over 38    Colon cancer Maternal Grandfather    Esophageal cancer Neg Hx    Inflammatory bowel disease Neg Hx    Liver disease Neg Hx    Pancreatic cancer Neg Hx    Rectal cancer Neg Hx    Stomach cancer Neg Hx    Social History   Socioeconomic History   Marital status: Married    Spouse name: Not on file   Number of children: 2   Years of education: Not on file  Highest education level: Not on file  Occupational History   Occupation: RN  Tobacco Use   Smoking status: Former    Types: Cigarettes    Quit date: 2013    Years since quitting: 10.9   Smokeless tobacco: Never  Vaping Use   Vaping Use: Never used  Substance and Sexual Activity   Alcohol use: Yes    Comment: 2 glasses of red wine daily   Drug use: Never   Sexual activity: Yes    Partners: Male    Birth control/protection: Surgical    Comment: 1st intercourse- 15, partners- 35-, MARRIED- 40, hysterectomy  Other Topics Concern   Not on file  Social History Narrative   Not on file   Social Determinants of Health   Financial Resource Strain: Low Risk  (02/13/2022)   Overall Financial Resource Strain (CARDIA)    Difficulty of Paying Living Expenses: Not hard at all  Food Insecurity: No Food Insecurity (02/13/2022)   Hunger Vital Sign    Worried About Running Out of Food in the Last Year: Never true    [place] in the Last Year: Never true  Transportation Needs: No Transportation Needs (02/13/2022)   PRAPARE - Hydrologist (Medical): No    Lack of Transportation (Non-Medical): No  Physical Activity: Inactive (02/13/2022)   Exercise Vital Sign    Days of Exercise per Week: 0 days    Minutes of Exercise per Session: 0 min  Stress: No Stress Concern Present (02/13/2022)   [place]    Feeling of Stress : Not at all  Social Connections: Moderately Integrated (02/08/2021)   Social Connection and Isolation Panel [NHANES]    Frequency of Communication with Friends and Family: Twice a week    Frequency of Social Gatherings with Friends and Family: Twice a week    Attends Religious Services: Never    Marine scientist or Organizations: Yes    Attends Music therapist: More than 4 times per year    Marital Status: Married    Tobacco Counseling Counseling given: Not Answered   Clinical Intake:  Pre-visit preparation completed: Yes  Pain : No/denies pain     Nutritional Status: BMI 25 -29 Overweight Nutritional Risks: None Diabetes: Yes  How often do you need to have someone help you when you read instructions, pamphlets, or other written materials from your doctor or pharmacy?: 1 - Never  Diabetic? Yes Nutrition Risk Assessment:  Has the patient had any N/V/D within the last 2 months?  No  Does the patient have any non-healing wounds?  No  Has the patient had any unintentional weight loss or weight gain?  No   Diabetes:  Is the patient diabetic?  Yes  If diabetic, was a CBG obtained today?  No  Did the patient bring in their glucometer from home?  No  How often do you monitor your CBG's? daily.   Financial Strains and Diabetes Management:  Are you having any financial strains with the device, your supplies or your medication? No .  Does the patient want to be seen by Chronic Care Management for management of their diabetes?  No  Would the patient like to be referred to a Nutritionist or for Diabetic Management?  No   Diabetic Exams:  Diabetic Eye Exam: Overdue for diabetic eye exam. Pt has been advised about the importance in completing this exam. Patient advised  to call and schedule an eye exam. Diabetic Foot Exam: Overdue, Pt has been advised about the importance in completing this exam. Pt is scheduled for  diabetic foot exam on next appointment.   Interpreter Needed?: No  Information entered by :: NAllen LPN   Activities of Daily Living    02/13/2022   10:15 AM 01/16/2022   10:59 AM  In your present state of health, do you have any difficulty performing the following activities:  Hearing? 0   Vision? 0   Difficulty concentrating or making decisions? 0   Walking or climbing stairs? 0   Dressing or bathing? 0   Doing errands, shopping? 0 0  Preparing Food and eating ? N   Using the Toilet? N   In the past six months, have you accidently leaked urine? N   Do you have problems with loss of bowel control? N   Managing your Medications? N   Managing your Finances? N   Housekeeping or managing your Housekeeping? N     Patient Care Team: [name], MD as PCP - General (Family Medicine)  Indicate any recent Medical Services you may have received from other than [facility] providers in the past year (date may be approximate).     Assessment:   This is a routine wellness examination for Alejandra Hess.  Hearing/Vision screen Vision Screening - Comments:: Regular eye exams, [facility]  Dietary issues and exercise activities discussed: Current Exercise Habits: The patient does not participate in regular exercise at present   Goals Addressed             This Visit's Progress    Patient Stated       02/13/2022, keep sugar under control       Depression Screen    02/13/2022   10:15 AM 02/09/2022    9:13 AM 01/17/2022   10:17 AM 01/02/2022   10:59 AM 12/26/2021    4:07 PM 09/26/2021    1:13 PM 07/05/2021   10:44 AM  PHQ 2/9 Scores  PHQ - 2 Score 0 0 0 0 0 0 0    Fall Risk    02/13/2022   10:15 AM 02/09/2022    9:14 AM 01/17/2022   10:17 AM 01/02/2022   10:59 AM 12/26/2021    4:08 PM  Fall Risk   Falls in the past year? 0 0 0 0 0  Number falls in past yr: 0 0 0 0 0  Injury with Fall? 0 0  0 0  Risk for fall due to : Medication side effect No Fall Risks      Follow up Falls prevention discussed;Education provided;Falls evaluation completed Falls evaluation completed       FALL RISK PREVENTION PERTAINING TO THE HOME:  Any stairs in or around the home? Yes  If so, are there any without handrails? No  Home free of loose throw rugs in walkways, pet beds, electrical cords, etc? Yes  Adequate lighting in your home to reduce risk of falls? Yes   ASSISTIVE DEVICES UTILIZED TO PREVENT FALLS:  Life alert? No  Use of a cane, walker or w/c? No  Grab bars in the bathroom? No  Shower chair or bench in shower? No  Elevated toilet seat or a handicapped toilet? Yes   TIMED UP AND GO:  Was the test performed? No .       Cognitive Function:        02/13/2022   10:16 AM  6CIT Screen  What Year? 0 points  What month? 0 points  What time? 0 points  Count back from 20 4 points  Months in reverse 0 points  Repeat phrase 0 points  Total Score 4 points    Immunizations Immunization History  Administered Date(s) Administered   Fluad Quad(high Dose 65+) 10/18/2015, 10/21/2018, 11/07/2021   Influenza Inj Mdck Quad Pf 12/10/2017   Influenza, High Dose Seasonal PF 10/27/2020   Influenza, Seasonal, Injecte, Preservative Fre 10/18/2015   Influenza-Unspecified 09/26/2016, 11/23/2019   PFIZER(Purple Top)SARS-COV-2 Vaccination 04/04/2019, 04/29/2019, 12/01/2019, 06/05/2020, 10/27/2020   PPD Test 07/24/2016   Pneumococcal Conjugate-13 12/09/2019   Pneumococcal Polysaccharide-23 10/21/2018   Zoster Recombinat (Shingrix) 03/09/2020, 07/11/2021    TDAP status: Due, Education has been provided regarding the importance of this vaccine. Advised may receive this vaccine at local pharmacy or Health Dept. Aware to provide a copy of the vaccination record if obtained from local pharmacy or Health Dept. Verbalized acceptance and understanding.  Flu Vaccine status: Up to date  Pneumococcal vaccine status: Up to date  Covid-19 vaccine status: Completed  vaccines  Qualifies for Shingles Vaccine? Yes   Zostavax completed Yes   Shingrix Completed?: Yes  Screening Tests Health Maintenance  Topic Date Due   OPHTHALMOLOGY EXAM  11/29/2021   FOOT EXAM  12/19/2021   Medicare Annual Wellness (AWV)  02/08/2022   MAMMOGRAM  03/01/2022   Diabetic kidney evaluation - Urine ACR  07/06/2022   HEMOGLOBIN A1C  07/17/2022   Diabetic kidney evaluation - eGFR measurement  01/30/2023   COLONOSCOPY (Pts 45-71yr Insurance coverage will need to be confirmed)  10/27/2024   Pneumonia Vaccine 66 Years old  Completed   INFLUENZA VACCINE  Completed   DEXA SCAN  Completed   Hepatitis C Screening  Completed   Zoster Vaccines- Shingrix  Completed   HPV VACCINES  Aged Out   DTaP/Tdap/Td  Discontinued   COVID-19 Vaccine  Discontinued    Health Maintenance  Health Maintenance Due  Topic Date Due   OPHTHALMOLOGY EXAM  11/29/2021   FOOT EXAM  12/19/2021   Medicare Annual Wellness (AWV)  02/08/2022    Colorectal cancer screening: Type of screening: Colonoscopy. Completed 10/27/2021. Repeat every 3 years  Mammogram status: Completed 03/01/2021. Repeat every year  Bone Density status: Completed 02/15/2021.   Lung Cancer Screening: (Low Dose CT Chest recommended if Age 68-80years, 30 pack-year currently smoking OR have quit w/in 15years.) does not qualify.   Lung Cancer Screening Referral: no  Additional Screening:  Hepatitis C Screening: does qualify; Completed 03/26/2014  Vision Screening: Recommended annual ophthalmology exams for early detection of glaucoma and other disorders of the eye. Is the patient up to date with their annual eye exam?  No  Who is the provider or what is the name of the [facility] which the patient [facility]ends [facility] exams[facility] If pt is not established with a provider, would they like to be referred to a provider to establish care? No .   Dental Screening: Recommended annual dental exams for proper oral  hygiene  Community Resource Referral / Chronic Care Management: CRR required this visit?  No   CCM required this visit?  No      Plan:     I have personally reviewed and noted the following in the patient's chart:   Medical and social history Use of alcohol, tobacco or illicit drugs  Current medications and supplements including opioid prescriptions. Patient is not currently taking opioid prescriptions. Functional ability  and status Nutritional status Physical activity Advanced directives List of other physicians Hospitalizations, surgeries, and ER visits in previous 12 months Vitals Screenings to include cognitive, depression, and falls Referrals and appointments  In addition, I have reviewed and discussed with patient certain preventive protocols, quality metrics, and best practice recommendations. A written personalized care plan for preventive services as well as general preventive health recommendations were provided to patient.     Kellie Simmering, LPN   [id number]   Nurse Notes: none  Due to this being a virtual visit, the after visit summary with patients personalized plan was offered to patient via mail or my-chart. Patient would like to access on my-chart

## 2022-02-13 NOTE — Patient Instructions (Signed)
[name] , Thank you for taking time to come for your Medicare Wellness Visit. I appreciate your ongoing commitment to your health goals. Please review the following plan we discussed and let me know if I can assist you in the future.   These are the goals we discussed:  Goals      Patient Stated     02/13/2022, keep sugar under control        This is a list of the screening recommended for you and due dates:  Health Maintenance  Topic Date Due   Eye exam for diabetics  11/29/2021   Complete foot exam   12/19/2021   Mammogram  03/01/2022   Yearly kidney health urinalysis for diabetes  07/06/2022   Hemoglobin A1C  07/17/2022   Yearly kidney function blood test for diabetes  01/30/2023   Medicare Annual Wellness Visit  02/14/2023   Colon Cancer Screening  10/27/2024   Pneumonia Vaccine  Completed   Flu Shot  Completed   DEXA scan (bone density measurement)  Completed   Hepatitis C Screening: USPSTF Recommendation to screen - Ages 25-79 yo.  Completed   Zoster (Shingles) Vaccine  Completed   HPV Vaccine  Aged Out   DTaP/Tdap/Td vaccine  Discontinued   COVID-19 Vaccine  Discontinued    Advanced directives: Please bring a copy of your POA (Power of Attorney) and/or Living Will to your next appointment.   Conditions/risks identified: none  Next appointment: Follow up in one year for your annual wellness visit    Preventive Care 65 Years and Older, Female Preventive care refers to lifestyle choices and visits with your health care provider that can promote health and wellness. What does preventive care include? A yearly physical exam. This is also called an annual well check. Dental exams once or twice a year. Routine eye exams. Ask your health care provider how often you should have your eyes checked. Personal lifestyle choices, including: Daily care of your teeth and gums. Regular physical activity. Eating a healthy diet. Avoiding tobacco and drug use. Limiting  alcohol use. Practicing safe sex. Taking low-dose aspirin every day. Taking vitamin and mineral supplements as recommended by your health care provider. What happens during an annual well check? The services and screenings done by your health care provider during your annual well check will depend on your age, overall health, lifestyle risk factors, and family history of disease. Counseling  Your health care provider may ask you questions about your: Alcohol use. Tobacco use. Drug use. Emotional well-being. Home and relationship well-being. Sexual activity. Eating habits. History of falls. Memory and ability to understand (cognition). Work and work Statistician. Reproductive health. Screening  You may have the following tests or measurements: Height, weight, and BMI. Blood pressure. Lipid and cholesterol levels. These may be checked every 5 years, or more frequently if you are over 69 years old. Skin check. Lung cancer screening. You may have this screening every year starting at age 17 if you have a 30-pack-year history of smoking and currently smoke or have quit within the past 15 years. Fecal occult blood test (FOBT) of the stool. You may have this test every year starting at age 26. Flexible sigmoidoscopy or colonoscopy. You may have a sigmoidoscopy every 5 years or a colonoscopy every 10 years starting at age 3. Hepatitis C blood test. Hepatitis B blood test. Sexually transmitted disease (STD) testing. Diabetes screening. This is done by checking your blood sugar (glucose) after you have not eaten for  a while (fasting). You may have this done every 1-3 years. Bone density scan. This is done to screen for osteoporosis. You may have this done starting at age 55. Mammogram. This may be done every 1-2 years. Talk to your health care provider about how often you should have regular mammograms. Talk with your health care provider about your test results, treatment options, and if  necessary, the need for more tests. Vaccines  Your health care provider may recommend certain vaccines, such as: Influenza vaccine. This is recommended every year. Tetanus, diphtheria, and acellular pertussis (Tdap, Td) vaccine. You may need a Td booster every 10 years. Zoster vaccine. You may need this after age 9. Pneumococcal 13-valent conjugate (PCV13) vaccine. One dose is recommended after age 29. Pneumococcal polysaccharide (PPSV23) vaccine. One dose is recommended after age 67. Talk to your health care provider about which screenings and vaccines you need and how often you need them. This information is not intended to replace advice given to you by your health care provider. Make sure you discuss any questions you have with your health care provider. Document Released: 03/11/2015 Document Revised: 11/02/2015 Document Reviewed: 12/14/2014 Elsevier Interactive Patient Education  2017 [place] Prevention in the Home Falls can cause injuries. They can happen to people of all ages. There are many things you can do to make your home safe and to help prevent falls. What can I do on the outside of my home? Regularly fix the edges of walkways and driveways and fix any cracks. Remove anything that might make you trip as you walk through a door, such as a raised step or threshold. Trim any bushes or trees on the path to your home. Use bright outdoor lighting. Clear any walking paths of anything that might make someone trip, such as rocks or tools. Regularly check to see if handrails are loose or broken. Make sure that both sides of any steps have handrails. Any raised decks and porches should have guardrails on the edges. Have any leaves, snow, or ice cleared regularly. Use sand or salt on walking paths during winter. Clean up any spills in your garage right away. This includes oil or grease spills. What can I do in the bathroom? Use night lights. Install grab bars by the toilet  and in the tub and shower. Do not use towel bars as grab bars. Use non-skid mats or decals in the tub or shower. If you need to sit down in the shower, use a plastic, non-slip stool. Keep the floor dry. Clean up any water that spills on the floor as soon as it happens. Remove soap buildup in the tub or shower regularly. Attach bath mats securely with double-sided non-slip rug tape. Do not have throw rugs and other things on the floor that can make you trip. What can I do in the bedroom? Use night lights. Make sure that you have a light by your bed that is easy to reach. Do not use any sheets or blankets that are too big for your bed. They should not hang down onto the floor. Have a firm chair that has side arms. You can use this for support while you get dressed. Do not have throw rugs and other things on the floor that can make you trip. What can I do in the kitchen? Clean up any spills right away. Avoid walking on wet floors. Keep items that you use a lot in easy-to-reach places. If you need to reach something above  you, use a strong step stool that has a grab bar. Keep electrical cords out of the way. Do not use floor polish or wax that makes floors slippery. If you must use wax, use non-skid floor wax. Do not have throw rugs and other things on the floor that can make you trip. What can I do with my stairs? Do not leave any items on the stairs. Make sure that there are handrails on both sides of the stairs and use them. Fix handrails that are broken or loose. Make sure that handrails are as long as the stairways. Check any carpeting to make sure that it is firmly attached to the stairs. Fix any carpet that is loose or worn. Avoid having throw rugs at the top or bottom of the stairs. If you do have throw rugs, attach them to the floor with carpet tape. Make sure that you have a light switch at the top of the stairs and the bottom of the stairs. If you do not have them, ask someone to add  them for you. What else can I do to help prevent falls? Wear shoes that: Do not have high heels. Have rubber bottoms. Are comfortable and fit you well. Are closed at the toe. Do not wear sandals. If you use a stepladder: Make sure that it is fully opened. Do not climb a closed stepladder. Make sure that both sides of the stepladder are locked into place. Ask someone to hold it for you, if possible. Clearly mark and make sure that you can see: Any grab bars or handrails. First and last steps. Where the edge of each step is. Use tools that help you move around (mobility aids) if they are needed. These include: Canes. Walkers. Scooters. Crutches. Turn on the lights when you go into a dark area. Replace any light bulbs as soon as they burn out. Set up your furniture so you have a clear path. Avoid moving your furniture around. If any of your floors are uneven, fix them. If there are any pets around you, be aware of where they are. Review your medicines with your doctor. Some medicines can make you feel dizzy. This can increase your chance of falling. Ask your doctor what other things that you can do to help prevent falls. This information is not intended to replace advice given to you by your health care provider. Make sure you discuss any questions you have with your health care provider. Document Released: 12/09/2008 Document Revised: 07/21/2015 Document Reviewed: 03/19/2014 Elsevier Interactive Patient Education  2017 [employer].

## 2022-03-06 ENCOUNTER — Ambulatory Visit: Payer: Medicare Other | Admitting: Family Medicine

## 2022-03-07 ENCOUNTER — Other Ambulatory Visit: Payer: Self-pay | Admitting: Obstetrics & Gynecology

## 2022-03-07 ENCOUNTER — Encounter: Payer: Self-pay | Admitting: Surgery

## 2022-03-07 DIAGNOSIS — Z1231 Encounter for screening mammogram for malignant neoplasm of breast: Secondary | ICD-10-CM

## 2022-03-09 ENCOUNTER — Ambulatory Visit
Admission: RE | Admit: 2022-03-09 | Discharge: 2022-03-09 | Disposition: A | Discharge status: Home / Self Care | Payer: Medicare Other | Source: Ambulatory Visit | Attending: Obstetrics & Gynecology | Admitting: Obstetrics & Gynecology

## 2022-03-09 DIAGNOSIS — Z1231 Encounter for screening mammogram for malignant neoplasm of breast: Secondary | ICD-10-CM

## 2022-03-15 ENCOUNTER — Other Ambulatory Visit (HOSPITAL_COMMUNITY): Payer: Self-pay

## 2022-03-25 ENCOUNTER — Other Ambulatory Visit: Payer: Self-pay | Admitting: Family Medicine

## 2022-03-25 DIAGNOSIS — I10 Essential (primary) hypertension: Secondary | ICD-10-CM

## 2022-03-27 ENCOUNTER — Other Ambulatory Visit: Payer: Self-pay | Admitting: Family Medicine

## 2022-03-27 DIAGNOSIS — J4530 Mild persistent asthma, uncomplicated: Secondary | ICD-10-CM

## 2022-03-27 DIAGNOSIS — I10 Essential (primary) hypertension: Secondary | ICD-10-CM

## 2022-03-30 ENCOUNTER — Other Ambulatory Visit: Payer: Self-pay

## 2022-03-30 MED ORDER — PANTOPRAZOLE SODIUM 40 MG PO TBEC: 40.0000 mg | DELAYED_RELEASE_TABLET | Freq: Every day | ORAL | 1 refills | Status: DC

## 2022-03-30 MED ORDER — VENLAFAXINE HCL ER 75 MG PO CP24: ORAL_CAPSULE | ORAL | 1 refills | Status: DC

## 2022-04-02 ENCOUNTER — Encounter: Payer: Self-pay | Admitting: Family Medicine

## 2022-04-02 ENCOUNTER — Other Ambulatory Visit: Payer: Self-pay

## 2022-04-02 DIAGNOSIS — I10 Essential (primary) hypertension: Secondary | ICD-10-CM

## 2022-04-02 DIAGNOSIS — E7800 Pure hypercholesterolemia, unspecified: Secondary | ICD-10-CM

## 2022-04-02 DIAGNOSIS — E119 Type 2 diabetes mellitus without complications: Secondary | ICD-10-CM

## 2022-04-02 MED ORDER — ROSUVASTATIN CALCIUM 20 MG PO TABS: ORAL_TABLET | ORAL | 3 refills | Status: DC

## 2022-04-02 MED ORDER — METFORMIN HCL ER 750 MG PO TB24: ORAL_TABLET | ORAL | 1 refills | Status: DC

## 2022-04-02 MED ORDER — OLMESARTAN MEDOXOMIL 40 MG PO TABS: 40.0000 mg | ORAL_TABLET | Freq: Every day | ORAL | 1 refills | Status: DC

## 2022-04-11 ENCOUNTER — Telehealth: Payer: Self-pay | Admitting: Family Medicine

## 2022-04-11 NOTE — Telephone Encounter (Signed)
[facility] [phone] Fax # [phone]  Requesting verbal order to refill and with a 90 day supply Chlorthalidone Caredilol

## 2022-04-12 NOTE — Telephone Encounter (Signed)
Per patients request requested prescriptions sent to different pharmacy [facility] to inform was not able to speak with live person.

## 2022-04-13 DIAGNOSIS — E119 Type 2 diabetes mellitus without complications: Secondary | ICD-10-CM | POA: Diagnosis not present

## 2022-04-13 DIAGNOSIS — H40023 Open angle with borderline findings, high risk, bilateral: Secondary | ICD-10-CM | POA: Diagnosis not present

## 2022-04-13 DIAGNOSIS — H2513 Age-related nuclear cataract, bilateral: Secondary | ICD-10-CM | POA: Diagnosis not present

## 2022-04-16 ENCOUNTER — Encounter: Payer: Self-pay | Admitting: Family Medicine

## 2022-04-16 ENCOUNTER — Ambulatory Visit: Payer: Medicare Other | Admitting: Family Medicine

## 2022-04-22 MED ORDER — TIRZEPATIDE 7.5 MG/0.5ML ~~LOC~~ SOAJ: 7.5000 mg | SUBCUTANEOUS | 1 refills | Status: DC

## 2022-04-24 ENCOUNTER — Other Ambulatory Visit: Payer: Self-pay

## 2022-04-24 ENCOUNTER — Other Ambulatory Visit: Payer: Self-pay | Admitting: Family Medicine

## 2022-04-24 DIAGNOSIS — E119 Type 2 diabetes mellitus without complications: Secondary | ICD-10-CM

## 2022-04-24 DIAGNOSIS — I10 Essential (primary) hypertension: Secondary | ICD-10-CM

## 2022-04-24 MED ORDER — TIRZEPATIDE 7.5 MG/0.5ML ~~LOC~~ SOAJ: 7.5000 mg | SUBCUTANEOUS | 1 refills | Status: DC

## 2022-04-25 ENCOUNTER — Other Ambulatory Visit: Payer: Self-pay

## 2022-04-25 MED ORDER — VENLAFAXINE HCL ER 75 MG PO CP24: ORAL_CAPSULE | ORAL | 1 refills | Status: DC

## 2022-04-26 ENCOUNTER — Other Ambulatory Visit: Payer: Self-pay

## 2022-04-26 DIAGNOSIS — I10 Essential (primary) hypertension: Secondary | ICD-10-CM

## 2022-04-26 MED ORDER — OLMESARTAN MEDOXOMIL 40 MG PO TABS: 40.0000 mg | ORAL_TABLET | Freq: Every day | ORAL | 0 refills | Status: DC

## 2022-04-26 MED ORDER — CHLORTHALIDONE 25 MG PO TABS: 25.0000 mg | ORAL_TABLET | Freq: Every day | ORAL | 1 refills | Status: DC

## 2022-04-26 MED ORDER — CARVEDILOL 6.25 MG PO TABS: ORAL_TABLET | ORAL | 3 refills | Status: DC

## 2022-04-27 ENCOUNTER — Ambulatory Visit: Payer: Medicare Other | Admitting: Obstetrics & Gynecology

## 2022-05-08 ENCOUNTER — Other Ambulatory Visit: Payer: Self-pay | Admitting: Gastroenterology

## 2022-05-22 ENCOUNTER — Ambulatory Visit: Payer: Medicare Other | Admitting: Family Medicine

## 2022-05-23 ENCOUNTER — Other Ambulatory Visit: Payer: Self-pay | Admitting: Family Medicine

## 2022-05-23 DIAGNOSIS — E7800 Pure hypercholesterolemia, unspecified: Secondary | ICD-10-CM

## 2022-05-24 ENCOUNTER — Ambulatory Visit (INDEPENDENT_AMBULATORY_CARE_PROVIDER_SITE_OTHER): Payer: Medicare Other | Admitting: Family Medicine

## 2022-05-24 VITALS — BP 100/60 | HR 71 | Temp 97.6°F | Ht 64.0 in | Wt 159.0 lb

## 2022-05-24 DIAGNOSIS — E7800 Pure hypercholesterolemia, unspecified: Secondary | ICD-10-CM

## 2022-05-24 DIAGNOSIS — E119 Type 2 diabetes mellitus without complications: Secondary | ICD-10-CM | POA: Diagnosis not present

## 2022-05-24 DIAGNOSIS — R5383 Other fatigue: Secondary | ICD-10-CM

## 2022-05-24 DIAGNOSIS — T887XXA Unspecified adverse effect of drug or medicament, initial encounter: Secondary | ICD-10-CM | POA: Diagnosis not present

## 2022-05-24 DIAGNOSIS — I10 Essential (primary) hypertension: Secondary | ICD-10-CM

## 2022-05-24 LAB — CBC WITH DIFFERENTIAL/PLATELET
Basophils Absolute: 0.1 10*3/uL (ref 0.0–0.1)
Basophils Relative: 0.9 % (ref 0.0–3.0)
Eosinophils Absolute: 0.3 10*3/uL (ref 0.0–0.7)
Eosinophils Relative: 4 % (ref 0.0–5.0)
HCT: 38.7 % (ref 36.0–46.0)
Hemoglobin: 13 g/dL (ref 12.0–15.0)
Lymphocytes Relative: 30.8 % (ref 12.0–46.0)
Lymphs Abs: 2.4 10*3/uL (ref 0.7–4.0)
MCHC: 33.5 g/dL (ref 30.0–36.0)
MCV: 93.3 fl (ref 78.0–100.0)
Monocytes Absolute: 0.7 10*3/uL (ref 0.1–1.0)
Monocytes Relative: 8.9 % (ref 3.0–12.0)
Neutro Abs: 4.3 10*3/uL (ref 1.4–7.7)
Neutrophils Relative %: 55.4 % (ref 43.0–77.0)
Platelets: 261 10*3/uL (ref 150.0–400.0)
RBC: 4.15 Mil/uL (ref 3.87–5.11)
RDW: 13 % (ref 11.5–15.5)
WBC: 7.8 10*3/uL (ref 4.0–10.5)

## 2022-05-24 LAB — COMPREHENSIVE METABOLIC PANEL
ALT: 21 U/L (ref 0–35)
AST: 16 U/L (ref 0–37)
Albumin: 4.2 g/dL (ref 3.5–5.2)
Alkaline Phosphatase: 71 U/L (ref 39–117)
BUN: 19 mg/dL (ref 6–23)
CO2: 31 mEq/L (ref 19–32)
Calcium: 9.5 mg/dL (ref 8.4–10.5)
Chloride: 97 mEq/L (ref 96–112)
Creatinine, Ser: 0.68 mg/dL (ref 0.40–1.20)
GFR: 89.24 mL/min (ref >60.00)
Glucose, Bld: 118 mg/dL — ABNORMAL HIGH (ref 70–99)
Potassium: 4.1 mEq/L (ref 3.5–5.1)
Sodium: 136 mEq/L (ref 135–145)
Total Bilirubin: 0.3 mg/dL (ref 0.2–1.2)
Total Protein: 6.7 g/dL (ref 6.0–8.3)

## 2022-05-24 LAB — URINALYSIS, ROUTINE W REFLEX MICROSCOPIC
Bilirubin Urine: NEGATIVE
Ketones, ur: NEGATIVE
Nitrite: NEGATIVE
Specific Gravity, Urine: 1.01 (ref 1.000–1.030)
Total Protein, Urine: NEGATIVE
Urine Glucose: NEGATIVE
Urobilinogen, UA: 0.2 (ref 0.0–1.0)
pH: 7 (ref 5.0–8.0)

## 2022-05-24 LAB — LDL CHOLESTEROL, DIRECT: Direct LDL: 73 mg/dL

## 2022-05-24 LAB — LIPASE: Lipase: 18 U/L (ref 11.0–59.0)

## 2022-05-24 LAB — TSH: TSH: 1.59 u[IU]/mL (ref 0.35–5.50)

## 2022-05-24 LAB — MICROALBUMIN / CREATININE URINE RATIO
Creatinine,U: 38.1 mg/dL
Microalb Creat Ratio: 1.8 mg/g (ref 0.0–30.0)
Microalb, Ur: <0.7 mg/dL (ref 0.0–1.9)

## 2022-05-24 LAB — HEMOGLOBIN A1C: Hgb A1c MFr Bld: 6.3 % (ref 4.6–6.5)

## 2022-05-24 NOTE — Progress Notes (Signed)
Established Patient Office Visit   Subjective:  Patient ID: Alejandra Hess, female    DOB: 1955-04-04  Age: 67 y.o. MRN: [id number]  Chief Complaint  Patient presents with   Diabetes    Diabetes Pertinent negatives for diabetes include no blurred vision, no polydipsia and no weakness.   Encounter Diagnoses  Name Primary?   Type 2 diabetes mellitus without complication, without long-term current use of insulin (HCC) Yes   Essential hypertension    Elevated LDL cholesterol level    Other fatigue    Medication side effect    Doing well.  Recovering from a URI.  Has experienced some night sweats.  Feels well.  No weight loss.  Blood pressure has been running lower at home much like today.   Review of Systems  Constitutional: Negative.   HENT: Negative.    Eyes:  Negative for blurred vision, discharge and redness.  Respiratory: Negative.    Cardiovascular: Negative.   Gastrointestinal:  Negative for abdominal pain.  Genitourinary: Negative.   Musculoskeletal: Negative.  Negative for myalgias.  Skin:  Negative for rash.  Neurological:  Negative for tingling, loss of consciousness and weakness.  Endo/Heme/Allergies:  Negative for polydipsia.     Current Outpatient Medications:    albuterol (PROVENTIL) (2.5 MG/3ML) 0.083% nebulizer solution, Take 2.5 mg by nebulization every 6 (six) hours as needed for shortness of breath or wheezing. PRN, Disp: , Rfl: 7   albuterol (VENTOLIN HFA) 108 (90 Base) MCG/ACT inhaler, Inhale 1-2 puffs into the lungs every 6 (six) hours as needed for wheezing or shortness of breath., Disp: , Rfl:    carvedilol (COREG) 6.25 MG tablet, TAKE ONE TABLET BY MOUTH TWICE A DAY WITH A MEAL, Disp: 90 tablet, Rfl: 3   cetirizine (ZYRTEC) 10 MG tablet, Take 10 mg by mouth daily., Disp: , Rfl:    glucose blood (ONETOUCH VERIO) test strip, Check blood sugar before 3 meals and at bedtime., Disp: 100 each, Rfl: 12   Lancets (ONETOUCH DELICA PLUS 123XX123) MISC, 1  each by Does not apply route in the morning, at noon, in the evening, and at bedtime., Disp: 100 each, Rfl: 3   magnesium oxide (MAG-OX) 400 MG tablet, Take 400 mg by mouth daily., Disp: , Rfl:    metFORMIN (GLUCOPHAGE-XR) 750 MG 24 hr tablet, TAKE ONE TABLET BY MOUTH TWICE A DAY AFTER A MEAL, Disp: 180 tablet, Rfl: 1   Multiple Vitamin (MULTIVITAMIN) tablet, Take 1 tablet by mouth daily., Disp: , Rfl:    olmesartan (BENICAR) 40 MG tablet, Take 1 tablet (40 mg total) by mouth daily., Disp: 90 tablet, Rfl: 0   pantoprazole (PROTONIX) 40 MG tablet, TAKE ONE TABLET BY MOUTH ONE TIME DAILY, Disp: 90 tablet, Rfl: 1   rosuvastatin (CRESTOR) 20 MG tablet, TAKE ONE TABLET BY MOUTH ONE TIME DAILY, Disp: 90 tablet, Rfl: 1   tirzepatide (MOUNJARO) 7.5 MG/0.5ML Pen, Inject 7.5 mg into the skin once a week., Disp: 6 mL, Rfl: 1   valACYclovir (VALTREX) 1000 MG tablet, Take 1/2 tablet 2 times daily for 3 days as needed for outbreaks., Disp: 9 tablet, Rfl: 0   venlafaxine XR (EFFEXOR-XR) 75 MG 24 hr capsule, TAKE ONE CAPSULE BY MOUTH ONE TIME DAILY WITH BREAKFAST, Disp: 90 capsule, Rfl: 1   benzonatate (TESSALON) 200 MG capsule, TAKE ONE CAPSULE BY MOUTH TWICE A DAY AS NEEDED FOR COUGH (Patient not taking: Reported on 05/24/2022), Disp: 20 capsule, Rfl: 0   budesonide-formoterol (SYMBICORT) 160-4.5 MCG/ACT inhaler, Inhale  1 puff into the lungs at bedtime as needed. (Patient not taking: Reported on 05/24/2022), Disp: 1 Inhaler, Rfl: 3   triamcinolone (NASACORT) 55 MCG/ACT AERO nasal inhaler, Place 1 spray into the nose daily. (Patient not taking: Reported on 05/24/2022), Disp: , Rfl:    Objective:     BP 100/60   Pulse 71   Temp 97.6 F (36.4 C)   Ht 5\' 4"  (1.626 m)   Wt 159 lb (72.1 kg)   SpO2 98%   BMI 27.29 kg/m  BP Readings from Last 3 Encounters:  05/24/22 100/60  02/09/22 110/66  01/29/22 110/63   Wt Readings from Last 3 Encounters:  05/24/22 159 lb (72.1 kg)  02/13/22 154 lb (69.9 kg)  02/09/22  157 lb 9.6 oz (71.5 kg)      Physical Exam Constitutional:      General: She is not in acute distress.    Appearance: Normal appearance. She is not ill-appearing, toxic-appearing or diaphoretic.  HENT:     Head: Normocephalic and atraumatic.     Right Ear: External ear normal.     Left Ear: External ear normal.     Mouth/Throat:     Mouth: Mucous membranes are moist.     Pharynx: Oropharynx is clear. No oropharyngeal exudate or posterior oropharyngeal erythema.  Eyes:     General: No scleral icterus.       Right eye: No discharge.        Left eye: No discharge.     Extraocular Movements: Extraocular movements intact.     Conjunctiva/sclera: Conjunctivae normal.     Pupils: Pupils are equal, round, and reactive to light.  Cardiovascular:     Rate and Rhythm: Normal rate and regular rhythm.  Pulmonary:     Effort: Pulmonary effort is normal. No respiratory distress.     Breath sounds: Normal breath sounds.  Abdominal:     General: Bowel sounds are normal.  Musculoskeletal:     Cervical back: No rigidity or tenderness.  Skin:    General: Skin is warm and dry.  Neurological:     Mental Status: She is alert and oriented to person, place, and time.  Psychiatric:        Mood and Affect: Mood normal.        Behavior: Behavior normal.      No results found for any visits on 05/24/22.    The 10-year ASCVD risk score (Arnett DK, et al., 2019) is: 10.9%    Assessment & Plan:   Type 2 diabetes mellitus without complication, without long-term current use of insulin (HCC) -     Comprehensive metabolic panel -     Hemoglobin A1c -     Urinalysis, Routine w reflex microscopic -     Microalbumin / creatinine urine ratio  Essential hypertension -     Comprehensive metabolic panel -     Urinalysis, Routine w reflex microscopic -     Microalbumin / creatinine urine ratio  Elevated LDL cholesterol level -     Comprehensive metabolic panel -     LDL cholesterol,  direct  Other fatigue -     CBC with Differential/Platelet -     Comprehensive metabolic panel -     TSH  Medication side effect -     Lipase    Return in about 3 months (around 08/24/2022), or Stop chlorthalidone..  Stop chlorthalidone.  Could consider increasing tirzepatide pending results of the hemoglobin A1c.  Follow-up on night  sweats.  [name], MD

## 2022-05-28 DIAGNOSIS — J209 Acute bronchitis, unspecified: Secondary | ICD-10-CM | POA: Diagnosis not present

## 2022-05-28 DIAGNOSIS — J45901 Unspecified asthma with (acute) exacerbation: Secondary | ICD-10-CM | POA: Diagnosis not present

## 2022-05-29 ENCOUNTER — Ambulatory Visit (INDEPENDENT_AMBULATORY_CARE_PROVIDER_SITE_OTHER): Payer: Medicare Other | Admitting: Family Medicine

## 2022-05-29 ENCOUNTER — Encounter: Payer: Self-pay | Admitting: Family Medicine

## 2022-05-29 VITALS — BP 122/70 | HR 69 | Temp 97.5°F | Ht 64.0 in | Wt 158.4 lb

## 2022-05-29 DIAGNOSIS — J309 Allergic rhinitis, unspecified: Secondary | ICD-10-CM | POA: Diagnosis not present

## 2022-05-29 DIAGNOSIS — J0110 Acute frontal sinusitis, unspecified: Secondary | ICD-10-CM | POA: Diagnosis not present

## 2022-05-29 MED ORDER — DOXYCYCLINE HYCLATE 100 MG PO TABS: 100.0000 mg | ORAL_TABLET | Freq: Two times a day (BID) | ORAL | 0 refills | Status: AC

## 2022-05-29 NOTE — Progress Notes (Signed)
Established Patient Office Visit   Subjective:  Patient ID: Alejandra Hess, female    DOB: February 06, 1956  Age: 67 y.o. MRN: [id number]  Chief Complaint  Patient presents with   Cough    Cough, congestion, low temp, neck sore very tired symptoms x 10 days. Lots of mucus    Cough Associated symptoms include wheezing. Pertinent negatives include no eye redness, hemoptysis, myalgias, rash or shortness of breath.   Encounter Diagnoses  Name Primary?   Acute frontal sinusitis, recurrence not specified Yes   Allergic rhinitis, unspecified seasonality, unspecified trigger    Presents with a 10-day history of congestion postnasal drip cough with wheezing.  Cough is now productive of purulent phlegm.  She is now experiencing frontal sinus pressure.  Ongoing spring allergies.  She is taking Zyrtec for this.  Was seen at urgent care few days ago.  Reports that her chest x-ray was clear.  She was started on 20 mg of prednisone daily.  Low-grade temperatures.   Review of Systems  Constitutional: Negative.   HENT:  Positive for congestion and sinus pain.   Eyes:  Negative for blurred vision, discharge and redness.  Respiratory:  Positive for cough, sputum production and wheezing. Negative for hemoptysis and shortness of breath.   Cardiovascular: Negative.   Gastrointestinal:  Negative for abdominal pain.  Genitourinary: Negative.   Musculoskeletal: Negative.  Negative for myalgias.  Skin:  Negative for rash.  Neurological:  Negative for tingling, loss of consciousness and weakness.  Endo/Heme/Allergies:  Negative for polydipsia.     Current Outpatient Medications:    albuterol (PROVENTIL) (2.5 MG/3ML) 0.083% nebulizer solution, Take 2.5 mg by nebulization every 6 (six) hours as needed for shortness of breath or wheezing. PRN, Disp: , Rfl: 7   albuterol (VENTOLIN HFA) 108 (90 Base) MCG/ACT inhaler, Inhale 1-2 puffs into the lungs every 6 (six) hours as needed for wheezing or shortness of  breath., Disp: , Rfl:    carvedilol (COREG) 6.25 MG tablet, TAKE ONE TABLET BY MOUTH TWICE A DAY WITH A MEAL, Disp: 90 tablet, Rfl: 3   cetirizine (ZYRTEC) 10 MG tablet, Take 10 mg by mouth daily., Disp: , Rfl:    doxycycline (VIBRA-TABS) 100 MG tablet, Take 1 tablet (100 mg total) by mouth 2 (two) times daily for 10 days., Disp: 20 tablet, Rfl: 0   glucose blood (ONETOUCH VERIO) test strip, Check blood sugar before 3 meals and at bedtime., Disp: 100 each, Rfl: 12   Lancets (ONETOUCH DELICA PLUS 123XX123) MISC, 1 each by Does not apply route in the morning, at noon, in the evening, and at bedtime., Disp: 100 each, Rfl: 3   magnesium oxide (MAG-OX) 400 MG tablet, Take 400 mg by mouth daily., Disp: , Rfl:    metFORMIN (GLUCOPHAGE-XR) 750 MG 24 hr tablet, TAKE ONE TABLET BY MOUTH TWICE A DAY AFTER A MEAL, Disp: 180 tablet, Rfl: 1   Multiple Vitamin (MULTIVITAMIN) tablet, Take 1 tablet by mouth daily., Disp: , Rfl:    olmesartan (BENICAR) 40 MG tablet, Take 1 tablet (40 mg total) by mouth daily., Disp: 90 tablet, Rfl: 0   pantoprazole (PROTONIX) 40 MG tablet, TAKE ONE TABLET BY MOUTH ONE TIME DAILY, Disp: 90 tablet, Rfl: 1   predniSONE (DELTASONE) 20 MG tablet, Take 20 mg by mouth 2 (two) times daily., Disp: , Rfl:    promethazine-dextromethorphan (PROMETHAZINE-DM) 6.25-15 MG/5ML syrup, Take 10 mLs by mouth 3 (three) times daily., Disp: , Rfl:    rosuvastatin (CRESTOR) 20 MG  tablet, TAKE ONE TABLET BY MOUTH ONE TIME DAILY, Disp: 90 tablet, Rfl: 1   tirzepatide (MOUNJARO) 7.5 MG/0.5ML Pen, Inject 7.5 mg into the skin once a week., Disp: 6 mL, Rfl: 1   valACYclovir (VALTREX) 1000 MG tablet, Take 1/2 tablet 2 times daily for 3 days as needed for outbreaks., Disp: 9 tablet, Rfl: 0   venlafaxine XR (EFFEXOR-XR) 75 MG 24 hr capsule, TAKE ONE CAPSULE BY MOUTH ONE TIME DAILY WITH BREAKFAST, Disp: 90 capsule, Rfl: 1   budesonide-formoterol (SYMBICORT) 160-4.5 MCG/ACT inhaler, Inhale 1 puff into the lungs at  bedtime as needed. (Patient not taking: Reported on 05/24/2022), Disp: 1 Inhaler, Rfl: 3   triamcinolone (NASACORT) 55 MCG/ACT AERO nasal inhaler, Place 1 spray into the nose daily. (Patient not taking: Reported on 05/24/2022), Disp: , Rfl:    Objective:     BP 122/70 (BP Location: Left Arm, Patient Position: Sitting, Cuff Size: Normal)   Pulse 69   Temp (!) 97.5 F (36.4 C) (Temporal)   Ht 5\' 4"  (1.626 m)   Wt 158 lb 6.4 oz (71.8 kg)   SpO2 97%   BMI 27.19 kg/m    Physical Exam Constitutional:      General: She is not in acute distress.    Appearance: Normal appearance. She is not ill-appearing, toxic-appearing or diaphoretic.  HENT:     Head: Normocephalic and atraumatic.     Right Ear: External ear normal.     Left Ear: External ear normal.     Mouth/Throat:     Mouth: Mucous membranes are moist.     Pharynx: Oropharynx is clear. No oropharyngeal exudate or posterior oropharyngeal erythema.  Eyes:     General: No scleral icterus.       Right eye: No discharge.        Left eye: No discharge.     Extraocular Movements: Extraocular movements intact.     Conjunctiva/sclera: Conjunctivae normal.     Pupils: Pupils are equal, round, and reactive to light.  Cardiovascular:     Rate and Rhythm: Normal rate and regular rhythm.  Pulmonary:     Effort: Pulmonary effort is normal. No respiratory distress.     Breath sounds: No wheezing or rales.  Musculoskeletal:     Cervical back: Normal range of motion. No rigidity or tenderness.  Lymphadenopathy:     Cervical: No cervical adenopathy.  Skin:    General: Skin is warm and dry.  Neurological:     Mental Status: She is alert and oriented to person, place, and time.  Psychiatric:        Mood and Affect: Mood normal.        Behavior: Behavior normal.      No results found for any visits on 05/29/22.    The 10-year ASCVD risk score (Arnett DK, et al., 2019) is: 15.9%    Assessment & Plan:   Acute frontal sinusitis,  recurrence not specified -     Doxycycline Hyclate; Take 1 tablet (100 mg total) by mouth 2 (two) times daily for 10 days.  Dispense: 20 tablet; Refill: 0  Allergic rhinitis, unspecified seasonality, unspecified trigger    Return if symptoms worsen or fail to improve.  Hold Zyrtec.  Start nasal steroid.  Complete course of prednisone.  Start doxycycline 100 mg twice daily for 10 days.  [name], MD

## 2022-06-01 ENCOUNTER — Ambulatory Visit: Payer: Medicare Other | Admitting: Obstetrics & Gynecology

## 2022-06-03 DIAGNOSIS — B370 Candidal stomatitis: Secondary | ICD-10-CM | POA: Diagnosis not present

## 2022-06-05 ENCOUNTER — Ambulatory Visit: Payer: BLUE CROSS/BLUE SHIELD | Admitting: Family Medicine

## 2022-06-13 ENCOUNTER — Encounter: Payer: Self-pay | Admitting: Family Medicine

## 2022-06-13 DIAGNOSIS — E119 Type 2 diabetes mellitus without complications: Secondary | ICD-10-CM

## 2022-06-18 MED ORDER — TIRZEPATIDE 10 MG/0.5ML ~~LOC~~ SOAJ: 10.0000 mg | SUBCUTANEOUS | 1 refills | Status: DC

## 2022-08-15 ENCOUNTER — Telehealth: Payer: Medicare Other | Admitting: Nurse Practitioner

## 2022-08-15 DIAGNOSIS — U071 COVID-19: Secondary | ICD-10-CM | POA: Diagnosis not present

## 2022-08-15 DIAGNOSIS — B370 Candidal stomatitis: Secondary | ICD-10-CM

## 2022-08-15 DIAGNOSIS — H9202 Otalgia, left ear: Secondary | ICD-10-CM

## 2022-08-15 MED ORDER — CIPROFLOXACIN-DEXAMETHASONE 0.3-0.1 % OT SUSP: 4.0000 [drp] | Freq: Two times a day (BID) | OTIC | 0 refills | Status: AC

## 2022-08-15 MED ORDER — NYSTATIN 100000 UNIT/ML MT SUSP: 5.0000 mL | Freq: Four times a day (QID) | OROMUCOSAL | 1 refills | Status: DC

## 2022-08-15 NOTE — Progress Notes (Signed)
Virtual Visit Consent   Alejandra Hess, you are scheduled for a virtual visit with a [facility] provider today. Just as with appointments in the office, your consent must be obtained to participate. Your consent will be active for this visit and any virtual visit you may have with one of our providers in the next 365 days. If you have a MyChart account, a copy of this consent can be sent to you electronically.  As this is a virtual visit, video technology does not allow for your provider to perform a traditional examination. This may limit your provider's ability to fully assess your condition. If your provider identifies any concerns that need to be evaluated in person or the need to arrange testing (such as labs, EKG, etc.), we will make arrangements to do so. Although advances in technology are sophisticated, we cannot ensure that it will always work on either your end or our end. If the connection with a video visit is poor, the visit may have to be switched to a telephone visit. With either a video or telephone visit, we are not always able to ensure that we have a secure connection.  By engaging in this virtual visit, you consent to the provision of healthcare and authorize for your insurance to be billed (if applicable) for the services provided during this visit. Depending on your insurance coverage, you may receive a charge related to this service.  I need to obtain your verbal consent now. Are you willing to proceed with your visit today? Alejandra Hess has provided verbal consent on 08/15/2022 for a virtual visit (video or telephone). Viviano Simas, FNP  Date: 08/15/2022 7:46 AM  Virtual Visit via Video Note   I, Viviano Simas, connected with  Alejandra Hess  ([id number], [birth year]) on 08/15/22 at  7:45 AM EDT by a video-enabled telemedicine application and verified that I am speaking with the correct person using two identifiers.  Location: Patient: Virtual Visit Location Patient:  Home Provider: Virtual Visit Location Provider: Home Office   I discussed the limitations of evaluation and management by telemedicine and the availability of in person appointments. The patient expressed understanding and agreed to proceed.    History of Present Illness: Alejandra Hess is a 67 y.o. who identifies as a female who was assigned female at birth, and is being seen today for COVID.   Symptom onset was 24 hours ago Sore throat  Nasal congestion  No fever  Has also been experiencing oral thrush symptoms she has had in the past as well   She has had COVID in the past  She has used an anti-viral in the past (last time she had a rebound infection) Has also had a steroid course in the past  Denies any complications or hospitalizations   She has been vaccinated for COVID including most recent booster   She took a home test for COVID that was positive last night  Just returned for [place] yesterday   She has been managing with Advil cold and sinus   She has had asthmatic bronchitis in the past needing an Albuterol inhaler  Has a nebulizer machine and has Albuterol has not needed it   She is having left earache and suffers from recurrent eustachian tube dysfunction that responds well to ear drops  Does not hurt to touch   Problems:  Patient Active Problem List   Diagnosis Date Noted   Overweight (BMI 25.0-29.9) 02/09/2022   Abnormal EKG 01/17/2022   Need for shingles vaccine  01/17/2022   Cough 01/02/2022   Medication side effect 09/26/2021   Uncontrolled type 2 diabetes mellitus with hyperglycemia (HCC) 05/25/2021   Dysfunction of left eustachian tube 05/25/2021   Ceruminosis, bilateral 10/27/2020   Essential hypertension 10/27/2020   Chronic eczematous otitis externa of both ears 10/27/2020   Acute non-recurrent maxillary sinusitis 09/22/2020   Adrenal nodule (HCC) 09/20/2020   Chronic cough    Heartburn    Asthma 10/08/2019   Hiatal hernia 08/16/2019    Schatzki's ring 08/16/2019   Gastritis 08/16/2019   History of colonic polyps 08/16/2019   Vasculitis (HCC) 05/04/2019   History of COVID-19 04/03/2019   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/03/2019   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   Other fatigue 01/20/2019   Hot flashes 01/20/2019   COVID-19 virus infection 12/12/2018   Viral syndrome 12/08/2018   Reactive airway disease 12/08/2018   Glaucoma 11/24/2018   Gastroesophageal reflux disease 11/21/2018   H/O esophagitis 11/21/2018   Elevated LDL cholesterol level 07/04/2018   Type 2 diabetes mellitus without complication, without long-term current use of insulin (HCC) 07/04/2018   Healthcare maintenance 01/27/2018   Asthmatic bronchitis 08/19/2017    Allergies:  Allergies  Allergen Reactions   Azithromycin Rash   Chlorhexidine Gluconate Rash   Jardiance [Empagliflozin] Other (See Comments)    Yeast vaginitis   Penicillins Hives and Rash   Medications:  Current Outpatient Medications:    albuterol (PROVENTIL) (2.5 MG/3ML) 0.083% nebulizer solution, Take 2.5 mg by nebulization every 6 (six) hours as needed for shortness of breath or wheezing. PRN, Disp: , Rfl: 7   albuterol (VENTOLIN HFA) 108 (90 Base) MCG/ACT inhaler, Inhale 1-2 puffs into the lungs every 6 (six) hours as needed for wheezing or shortness of breath., Disp: , Rfl:    budesonide-formoterol (SYMBICORT) 160-4.5 MCG/ACT inhaler, Inhale 1 puff into the lungs at bedtime as needed. (Patient not taking: Reported on 05/24/2022), Disp: 1 Inhaler, Rfl: 3   carvedilol (COREG) 6.25 MG tablet, TAKE ONE TABLET BY MOUTH TWICE A DAY WITH A MEAL, Disp: 90 tablet, Rfl: 3   cetirizine (ZYRTEC) 10 MG tablet, Take 10 mg by mouth daily., Disp: , Rfl:    glucose blood (ONETOUCH VERIO) test strip, Check blood sugar before 3 meals and at bedtime., Disp: 100 each, Rfl: 12   Lancets (ONETOUCH DELICA PLUS LANCET33G) MISC, 1 each by Does not apply route in the morning, at noon, in the evening, and at bedtime., Disp: 100  each, Rfl: 3   magnesium oxide (MAG-OX) 400 MG tablet, Take 400 mg by mouth daily., Disp: , Rfl:    metFORMIN (GLUCOPHAGE-XR) 750 MG 24 hr tablet, TAKE ONE TABLET BY MOUTH TWICE A DAY AFTER A MEAL, Disp: 180 tablet, Rfl: 1   Multiple Vitamin (MULTIVITAMIN) tablet, Take 1 tablet by mouth daily., Disp: , Rfl:    olmesartan (BENICAR) 40 MG tablet, Take 1 tablet (40 mg total) by mouth daily., Disp: 90 tablet, Rfl: 0   pantoprazole (PROTONIX) 40 MG tablet, TAKE ONE TABLET BY MOUTH ONE TIME DAILY, Disp: 90 tablet, Rfl: 1   predniSONE (DELTASONE) 20 MG tablet, Take 20 mg by mouth 2 (two) times daily., Disp: , Rfl:    promethazine-dextromethorphan (PROMETHAZINE-DM) 6.25-15 MG/5ML syrup, Take 10 mLs by mouth 3 (three) times daily., Disp: , Rfl:    rosuvastatin (CRESTOR) 20 MG tablet, TAKE ONE TABLET BY MOUTH ONE TIME DAILY, Disp: 90 tablet, Rfl: 1   tirzepatide (MOUNJARO) 10 MG/0.5ML Pen, Inject 10 mg into the skin once a week., Disp:  6 mL, Rfl: 1   triamcinolone (NASACORT) 55 MCG/ACT AERO nasal inhaler, Place 1 spray into the nose daily. (Patient not taking: Reported on 05/24/2022), Disp: , Rfl:    valACYclovir (VALTREX) 1000 MG tablet, Take 1/2 tablet 2 times daily for 3 days as needed for outbreaks., Disp: 9 tablet, Rfl: 0   venlafaxine XR (EFFEXOR-XR) 75 MG 24 hr capsule, TAKE ONE CAPSULE BY MOUTH ONE TIME DAILY WITH BREAKFAST, Disp: 90 capsule, Rfl: 1  Observations/Objective: Patient is well-developed, well-nourished in no acute distress.  Resting comfortably  at home.  Head is normocephalic, atraumatic.  No labored breathing.  Speech is clear and coherent with logical content.  Patient is alert and oriented at baseline.    Assessment and Plan: 1. COVID-19  Prefers to continue management with OTCs  Had poor outcome with Paxlovid in the past  2. Thrush, oral  - nystatin (MYCOSTATIN) 100000 UNIT/ML suspension; Use as directed 5 mLs (500,000 Units total) in the mouth or throat 4 (four) times  daily. Swish, gargle and spit  Dispense: 120 mL; Refill: 1  3. Ear pain, left  - ciprofloxacin-dexamethasone (CIPRODEX) OTIC suspension; Place 4 drops into the left ear 2 (two) times daily for 7 days.  Dispense: 7.5 mL; Refill: 0     Follow Up Instructions: I discussed the assessment and treatment plan with the patient. The patient was provided an opportunity to ask questions and all were answered. The patient agreed with the plan and demonstrated an understanding of the instructions.  A copy of instructions were sent to the patient via MyChart unless otherwise noted below.    The patient was advised to call back or seek an in-person evaluation if the symptoms worsen or if the condition fails to improve as anticipated.  Time:  I spent 15 minutes with the patient via telehealth technology discussing the above problems/concerns.    Viviano Simas, FNP

## 2022-08-24 ENCOUNTER — Encounter: Payer: Self-pay | Admitting: Family Medicine

## 2022-08-24 ENCOUNTER — Ambulatory Visit (INDEPENDENT_AMBULATORY_CARE_PROVIDER_SITE_OTHER): Payer: Medicare Other | Admitting: Family Medicine

## 2022-08-24 ENCOUNTER — Telehealth: Payer: Self-pay

## 2022-08-24 VITALS — BP 132/74 | HR 76 | Temp 97.5°F | Ht 64.0 in | Wt 155.2 lb

## 2022-08-24 DIAGNOSIS — B002 Herpesviral gingivostomatitis and pharyngotonsillitis: Secondary | ICD-10-CM | POA: Diagnosis not present

## 2022-08-24 DIAGNOSIS — Z7984 Long term (current) use of oral hypoglycemic drugs: Secondary | ICD-10-CM | POA: Diagnosis not present

## 2022-08-24 DIAGNOSIS — E119 Type 2 diabetes mellitus without complications: Secondary | ICD-10-CM

## 2022-08-24 DIAGNOSIS — I10 Essential (primary) hypertension: Secondary | ICD-10-CM

## 2022-08-24 DIAGNOSIS — R058 Other specified cough: Secondary | ICD-10-CM

## 2022-08-24 DIAGNOSIS — Z7985 Long-term (current) use of injectable non-insulin antidiabetic drugs: Secondary | ICD-10-CM | POA: Diagnosis not present

## 2022-08-24 LAB — HEMOGLOBIN A1C: Hgb A1c MFr Bld: 6.2 % (ref 4.6–6.5)

## 2022-08-24 LAB — BASIC METABOLIC PANEL
BUN: 19 mg/dL (ref 6–23)
CO2: 31 mEq/L (ref 19–32)
Calcium: 10.3 mg/dL (ref 8.4–10.5)
Chloride: 98 mEq/L (ref 96–112)
Creatinine, Ser: 0.76 mg/dL (ref 0.40–1.20)
GFR: 80.15 mL/min (ref >60.00)
Glucose, Bld: 112 mg/dL — ABNORMAL HIGH (ref 70–99)
Potassium: 4.7 mEq/L (ref 3.5–5.1)
Sodium: 137 mEq/L (ref 135–145)

## 2022-08-24 MED ORDER — PREDNISONE 20 MG PO TABS: 20.0000 mg | ORAL_TABLET | Freq: Two times a day (BID) | ORAL | 0 refills | Status: AC

## 2022-08-24 MED ORDER — VALACYCLOVIR HCL 1 G PO TABS: ORAL_TABLET | ORAL | 0 refills | Status: AC

## 2022-08-24 NOTE — Patient Instructions (Signed)
Visit Information  Thank you for taking time to visit with me today. Please don't hesitate to contact me if I can be of assistance to you.   Following are the goals we discussed today:   Goals Addressed             This Visit's Progress    COMPLETED: Care Coordination Activities-No follow up required       Care Coordination Interventions: Discussed [facility] services and support. Assessed SDOH. Advised to discuss with primary care physician if services needed in the future.         If you are experiencing a Mental Health or Behavioral Health Crisis or need someone to talk to, please call the Suicide and Crisis Lifeline: 988   Patient verbalizes understanding of instructions and care plan provided today and agrees to view in MyChart. Active MyChart status and patient understanding of how to access instructions and care plan via MyChart confirmed with patient.     The patient has been provided with contact information for the care management team and has been advised to call with any health related questions or concerns.   Donnelle Olmeda J Jisselle Poth, RN, MSN [facility] Care Management Care Management Coordinator Direct Line [phone]     

## 2022-08-24 NOTE — Patient Outreach (Signed)
  Care Coordination   In Person Provider Office Visit Note   08/24/2022 Name: Alejandra Hess MRN: [id number] DOB: Apr 07, 1955  Alejandra Hess is a 67 y.o. year old female who sees Mliss Sax, MD for primary care. I engaged with Tyson Babinski in the providers office today.  What matters to the patients health and wellness today?  none    Goals Addressed             This Visit's Progress    COMPLETED: Care Coordination Activities-No follow up required       Care Coordination Interventions: Discussed [facility] services and support. Assessed SDOH. Advised to discuss with primary care physician if services needed in the future.         SDOH assessments and interventions completed:  Yes     Care Coordination Interventions:  Yes, provided   Follow up plan: No further intervention required.   Encounter Outcome:  Pt. Visit Completed   Bary Leriche, RN, MSN [facility] Care Management Care Management Coordinator Direct Line [phone]

## 2022-08-24 NOTE — Progress Notes (Signed)
Established Patient Office Visit   Subjective:  Patient ID: Alejandra Hess, female    DOB: 07/23/1955  Age: 67 y.o. MRN: [id number]  Chief Complaint  Patient presents with   Medical Management of Chronic Issues    3 month follow up, no concerns    HPI Encounter Diagnoses  Name Primary?   Type 2 diabetes mellitus without complication, without long-term current use of insulin (HCC) Yes   Oral herpes    Post-viral cough syndrome    Essential hypertension    Doing quite well.  Continues Mounjaro and metformin for diabetes.  Tolerating both drugs well.  Has been able to lose a little weight.  Picked up COVID infection infection on recent cruise.  There is a dry lingering cough.  There is no phlegm or fever.  Hypertension well-controlled with carvedilol and olmesartan.   Review of Systems  Constitutional: Negative.  Negative for chills and fever.  HENT: Negative.    Eyes:  Negative for blurred vision, discharge and redness.  Respiratory:  Positive for cough. Negative for hemoptysis, sputum production, shortness of breath and wheezing.   Cardiovascular: Negative.   Gastrointestinal:  Negative for abdominal pain.  Genitourinary: Negative.   Musculoskeletal: Negative.  Negative for myalgias.  Skin:  Negative for rash.  Neurological:  Negative for tingling, loss of consciousness and weakness.  Endo/Heme/Allergies:  Negative for polydipsia.      08/24/2022    9:51 AM 05/29/2022   11:20 AM 05/24/2022   10:14 AM  Depression screen PHQ 2/9  Decreased Interest 0 0 0  Down, Depressed, Hopeless 0 0 0  PHQ - 2 Score 0 0 0       Current Outpatient Medications:    albuterol (PROVENTIL) (2.5 MG/3ML) 0.083% nebulizer solution, Take 2.5 mg by nebulization every 6 (six) hours as needed for shortness of breath or wheezing. PRN, Disp: , Rfl: 7   albuterol (VENTOLIN HFA) 108 (90 Base) MCG/ACT inhaler, Inhale 1-2 puffs into the lungs every 6 (six) hours as needed for wheezing or shortness of  breath., Disp: , Rfl:    budesonide-formoterol (SYMBICORT) 160-4.5 MCG/ACT inhaler, Inhale 1 puff into the lungs at bedtime as needed., Disp: 1 Inhaler, Rfl: 3   carvedilol (COREG) 6.25 MG tablet, TAKE ONE TABLET BY MOUTH TWICE A DAY WITH A MEAL, Disp: 90 tablet, Rfl: 3   cetirizine (ZYRTEC) 10 MG tablet, Take 10 mg by mouth daily., Disp: , Rfl:    glucose blood (ONETOUCH VERIO) test strip, Check blood sugar before 3 meals and at bedtime., Disp: 100 each, Rfl: 12   Lancets (ONETOUCH DELICA PLUS LANCET33G) MISC, 1 each by Does not apply route in the morning, at noon, in the evening, and at bedtime., Disp: 100 each, Rfl: 3   magnesium oxide (MAG-OX) 400 MG tablet, Take 400 mg by mouth daily., Disp: , Rfl:    metFORMIN (GLUCOPHAGE-XR) 750 MG 24 hr tablet, TAKE ONE TABLET BY MOUTH TWICE A DAY AFTER A MEAL, Disp: 180 tablet, Rfl: 1   Multiple Vitamin (MULTIVITAMIN) tablet, Take 1 tablet by mouth daily., Disp: , Rfl:    olmesartan (BENICAR) 40 MG tablet, Take 1 tablet (40 mg total) by mouth daily., Disp: 90 tablet, Rfl: 0   pantoprazole (PROTONIX) 40 MG tablet, TAKE ONE TABLET BY MOUTH ONE TIME DAILY, Disp: 90 tablet, Rfl: 1   rosuvastatin (CRESTOR) 20 MG tablet, TAKE ONE TABLET BY MOUTH ONE TIME DAILY, Disp: 90 tablet, Rfl: 1   tirzepatide (MOUNJARO) 10 MG/0.5ML Pen, Inject  10 mg into the skin once a week., Disp: 6 mL, Rfl: 1   triamcinolone (NASACORT) 55 MCG/ACT AERO nasal inhaler, Place 1 spray into the nose daily., Disp: , Rfl:    venlafaxine XR (EFFEXOR-XR) 75 MG 24 hr capsule, TAKE ONE CAPSULE BY MOUTH ONE TIME DAILY WITH BREAKFAST, Disp: 90 capsule, Rfl: 1   predniSONE (DELTASONE) 20 MG tablet, Take 1 tablet (20 mg total) by mouth 2 (two) times daily for 5 days., Disp: 10 tablet, Rfl: 0   valACYclovir (VALTREX) 1000 MG tablet, Take 1/2 tablet 2 times daily for 3 days as needed for outbreaks., Disp: 9 tablet, Rfl: 0   Objective:     BP 132/74 (BP Location: Left Arm, Patient Position: Sitting,  Cuff Size: Normal)   Pulse 76   Temp (!) 97.5 F (36.4 C) (Temporal)   Ht 5\' 4"  (1.626 m)   Wt 155 lb 3.2 oz (70.4 kg)   SpO2 98%   BMI 26.64 kg/m  Wt Readings from Last 3 Encounters:  08/24/22 155 lb 3.2 oz (70.4 kg)  05/29/22 158 lb 6.4 oz (71.8 kg)  05/24/22 159 lb (72.1 kg)      Physical Exam Constitutional:      General: She is not in acute distress.    Appearance: Normal appearance. She is not ill-appearing, toxic-appearing or diaphoretic.  HENT:     Head: Normocephalic and atraumatic.     Right Ear: External ear normal.     Left Ear: External ear normal.     Mouth/Throat:     Mouth: Mucous membranes are moist.     Pharynx: Oropharynx is clear. No oropharyngeal exudate or posterior oropharyngeal erythema.  Eyes:     General: No scleral icterus.       Right eye: No discharge.        Left eye: No discharge.     Extraocular Movements: Extraocular movements intact.     Conjunctiva/sclera: Conjunctivae normal.     Pupils: Pupils are equal, round, and reactive to light.  Cardiovascular:     Rate and Rhythm: Normal rate and regular rhythm.     Pulses:          Dorsalis pedis pulses are 2+ on the right side and 2+ on the left side.       Posterior tibial pulses are 2+ on the right side and 2+ on the left side.  Pulmonary:     Effort: Pulmonary effort is normal. No respiratory distress.     Breath sounds: Normal breath sounds. No wheezing, rhonchi or rales.  Abdominal:     General: Bowel sounds are normal.  Musculoskeletal:     Cervical back: No rigidity or tenderness.  Skin:    General: Skin is warm and dry.  Neurological:     Mental Status: She is alert and oriented to person, place, and time.  Psychiatric:        Mood and Affect: Mood normal.        Behavior: Behavior normal.    Diabetic Foot Exam - Simple   Simple Foot Form Visual Inspection See comments: Yes Sensation Testing Intact to touch and monofilament testing bilaterally: Yes Pulse  Check Posterior Tibialis and Dorsalis pulse intact bilaterally: Yes Comments Feet are pes cavus bilaterally without lesions or ulcerations.      No results found for any visits on 08/24/22.    The 10-year ASCVD risk score (Arnett DK, et al., 2019) is: 20.5%    Assessment & Plan:  Type 2 diabetes mellitus without complication, without long-term current use of insulin (HCC) -     Basic metabolic panel -     Hemoglobin A1c  Oral herpes -     valACYclovir HCl; Take 1/2 tablet 2 times daily for 3 days as needed for outbreaks.  Dispense: 9 tablet; Refill: 0  Post-viral cough syndrome -     predniSONE; Take 1 tablet (20 mg total) by mouth 2 (two) times daily for 5 days.  Dispense: 10 tablet; Refill: 0  Essential hypertension    Return in about 6 months (around 02/23/2023), or if symptoms worsen or fail to improve.   Continue all medications as above. Mliss Sax, MD

## 2022-08-30 IMAGING — MG DIGITAL SCREENING BREAST BILAT IMPLANT W/ TOMO W/ CAD
8 of 12 series · 8 of 28 positions shown · non-contrast
Comparison: Previous exam(s).

CLINICAL DATA: Screening.

EXAM:
DIGITAL SCREENING BILATERAL MAMMOGRAM WITH IMPLANTS, CAD AND
TOMOSYNTHESIS
TECHNIQUE: Bilateral screening digital craniocaudal and mediolateral oblique
mammograms were obtained. Bilateral screening digital breast
tomosynthesis was performed. The images were evaluated with
computer-aided detection. Standard and/or implant displaced views
were performed.

[L MLO]
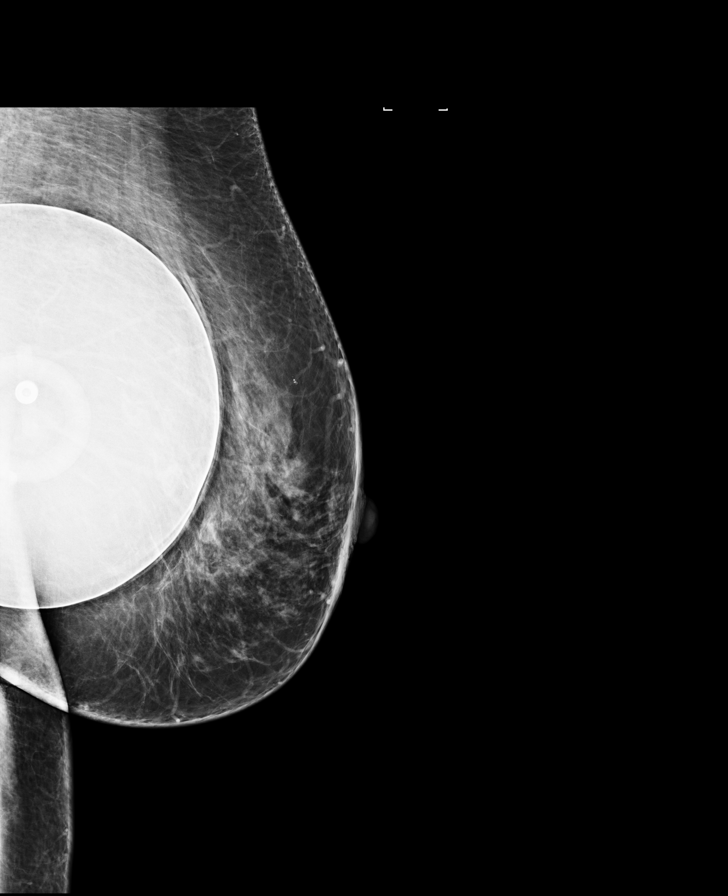

[L CC]
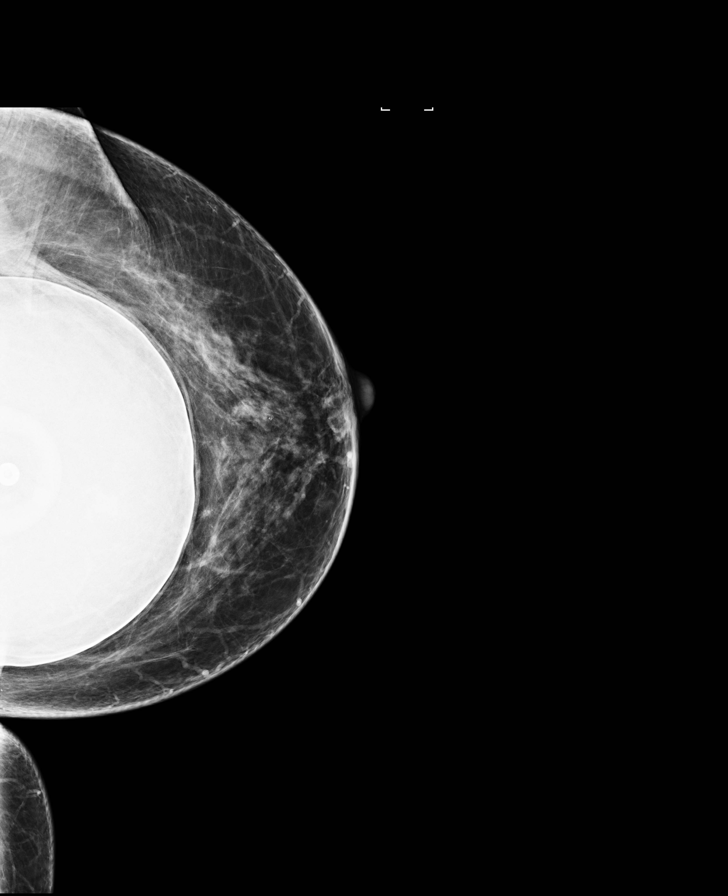

[R CC]
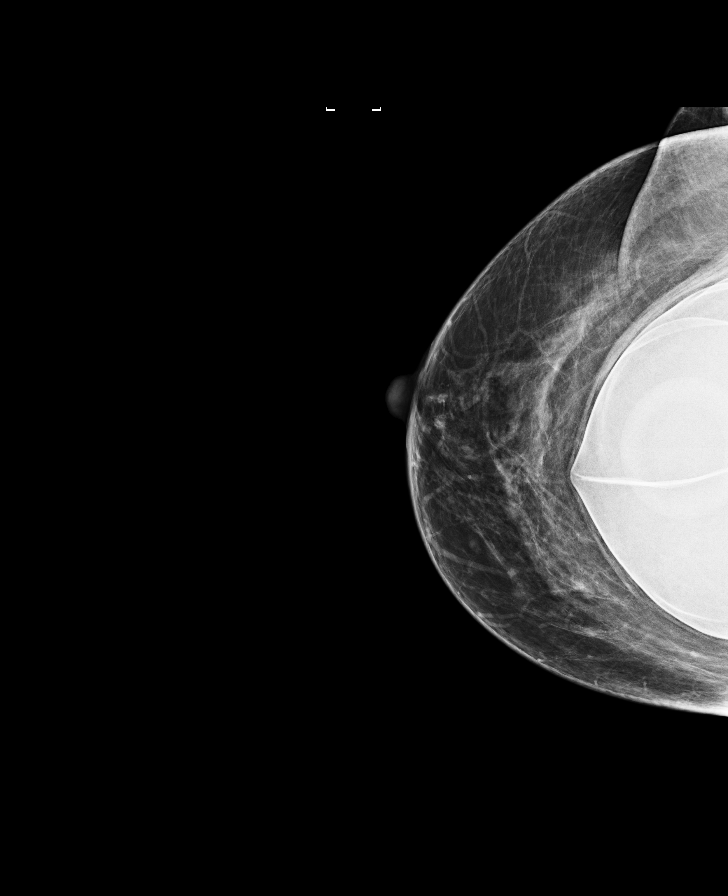

[R MLO]
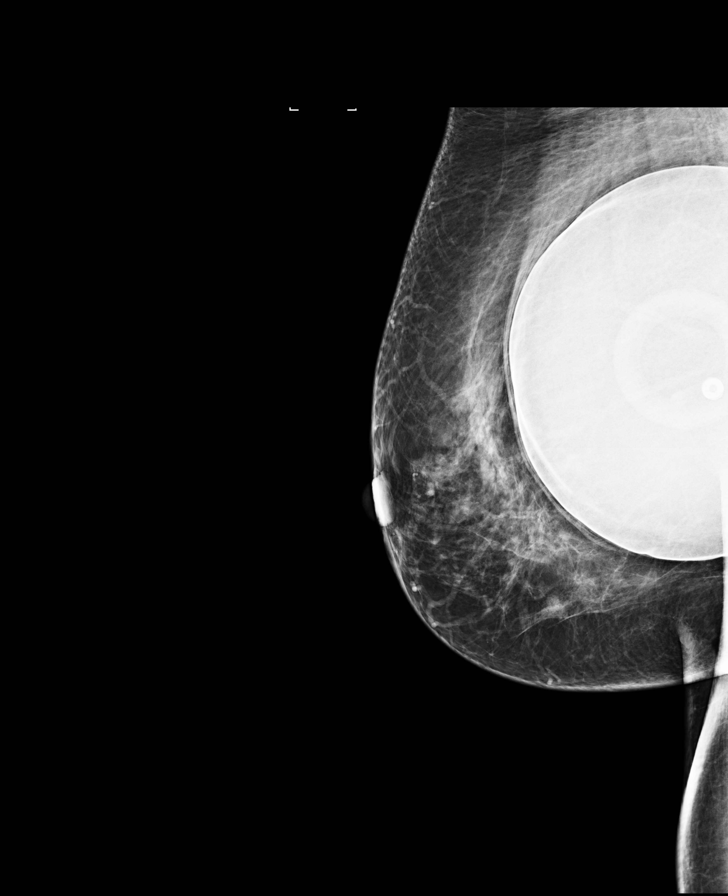

[R MLO synth-2D]
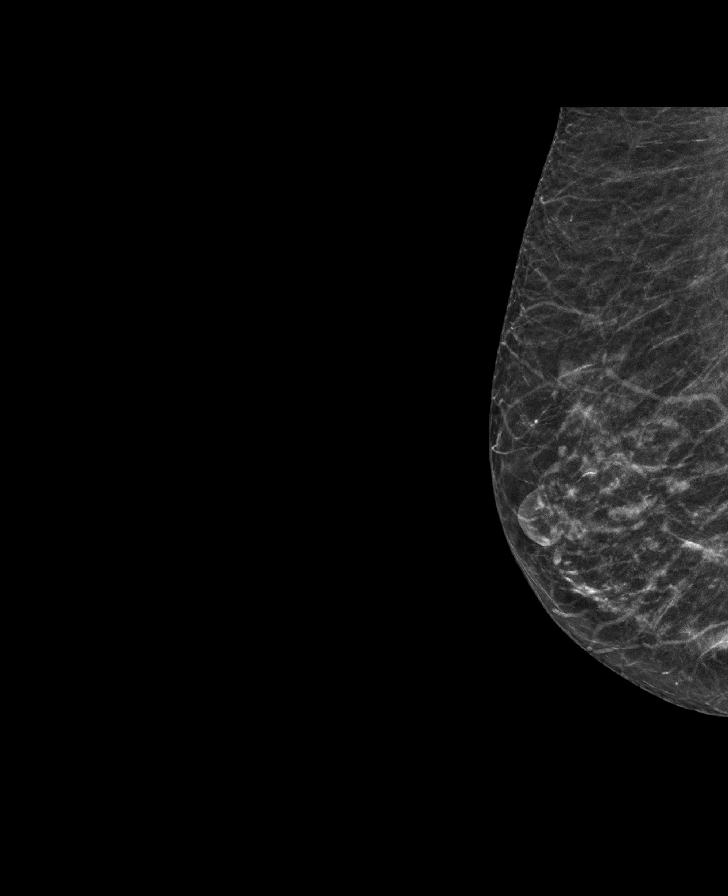

[L CC synth-2D]
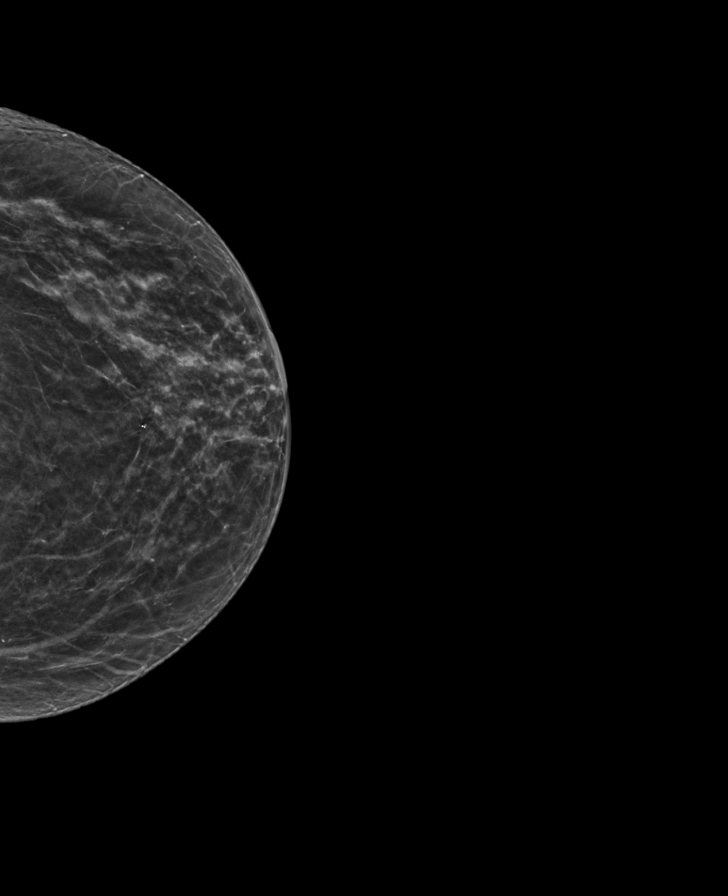

[L MLO synth-2D]
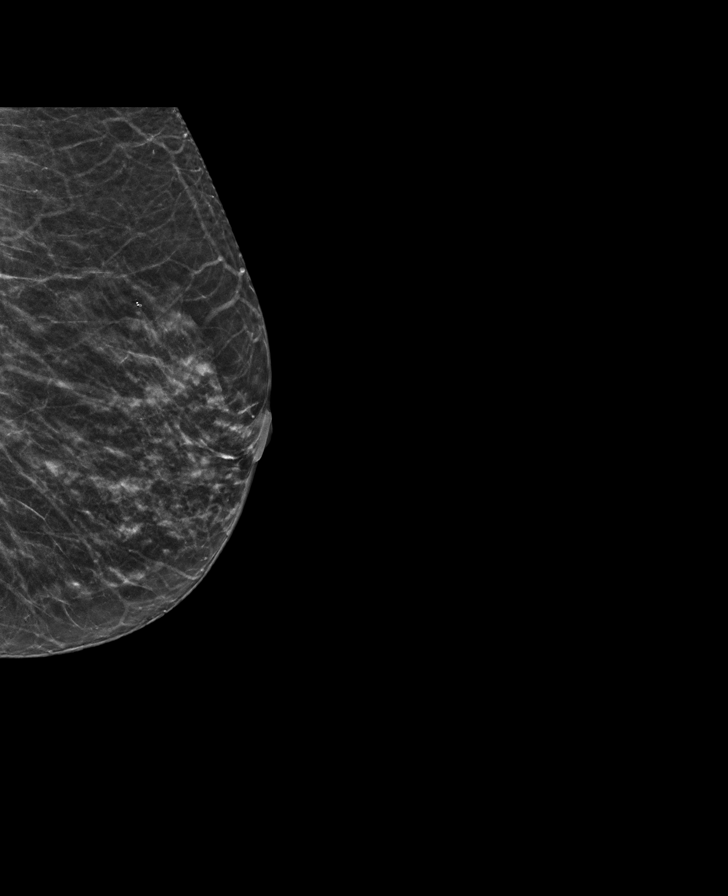

[R CC synth-2D]
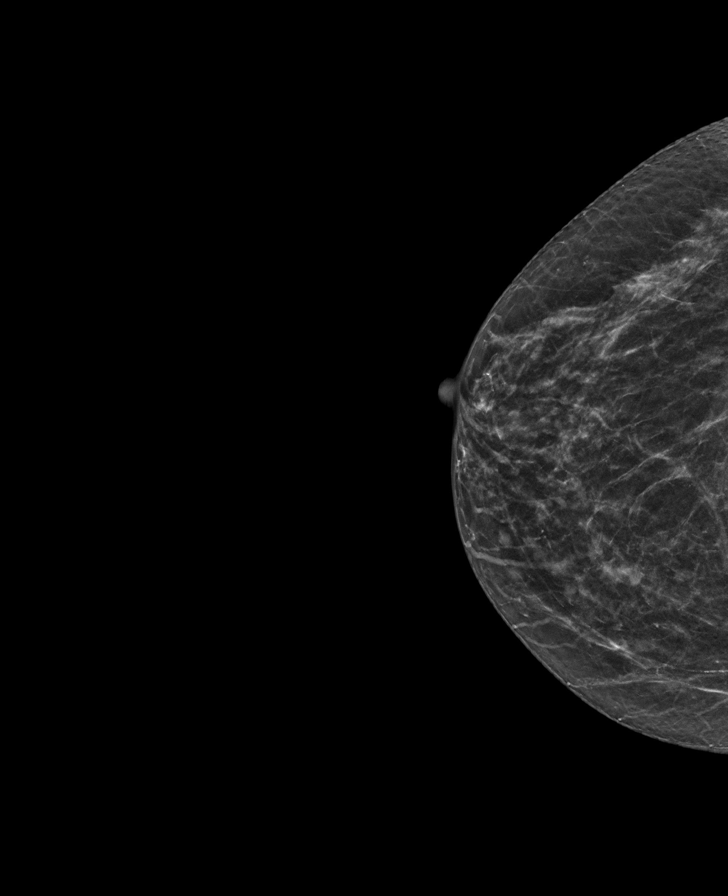

[8 of 28 positions shown; findings below may reference images not displayed]

ACR Breast Density Category b: There are scattered areas of
fibroglandular density.
FINDINGS: The patient has retropectoral implants. There are no findings
suspicious for malignancy.
IMPRESSION: No mammographic evidence of malignancy. A result letter of this
screening mammogram will be mailed directly to the patient.

RECOMMENDATION:
Screening mammogram in one year. (Code:SE-S-JMG)

BI-RADS CATEGORY  1:  Negative.

## 2022-09-06 ENCOUNTER — Other Ambulatory Visit: Payer: Self-pay | Admitting: Family Medicine

## 2022-09-06 ENCOUNTER — Other Ambulatory Visit: Payer: Self-pay | Admitting: Gastroenterology

## 2022-09-06 DIAGNOSIS — E119 Type 2 diabetes mellitus without complications: Secondary | ICD-10-CM

## 2022-09-06 DIAGNOSIS — I10 Essential (primary) hypertension: Secondary | ICD-10-CM

## 2022-12-02 ENCOUNTER — Other Ambulatory Visit: Payer: Self-pay | Admitting: Family Medicine

## 2022-12-02 DIAGNOSIS — E119 Type 2 diabetes mellitus without complications: Secondary | ICD-10-CM

## 2022-12-02 DIAGNOSIS — I10 Essential (primary) hypertension: Secondary | ICD-10-CM

## 2022-12-14 DIAGNOSIS — Z23 Encounter for immunization: Secondary | ICD-10-CM | POA: Diagnosis not present

## 2022-12-18 ENCOUNTER — Other Ambulatory Visit: Payer: Self-pay | Admitting: Family Medicine

## 2022-12-18 DIAGNOSIS — I10 Essential (primary) hypertension: Secondary | ICD-10-CM

## 2022-12-24 ENCOUNTER — Encounter (HOSPITAL_BASED_OUTPATIENT_CLINIC_OR_DEPARTMENT_OTHER): Payer: Self-pay | Admitting: Student

## 2022-12-24 ENCOUNTER — Ambulatory Visit (HOSPITAL_BASED_OUTPATIENT_CLINIC_OR_DEPARTMENT_OTHER): Payer: Medicare Other

## 2022-12-24 ENCOUNTER — Encounter: Payer: Self-pay | Admitting: Family Medicine

## 2022-12-24 ENCOUNTER — Ambulatory Visit (HOSPITAL_BASED_OUTPATIENT_CLINIC_OR_DEPARTMENT_OTHER): Payer: Medicare Other | Admitting: Student

## 2022-12-24 DIAGNOSIS — S82831A Other fracture of upper and lower end of right fibula, initial encounter for closed fracture: Secondary | ICD-10-CM | POA: Diagnosis not present

## 2022-12-24 DIAGNOSIS — M25571 Pain in right ankle and joints of right foot: Secondary | ICD-10-CM

## 2022-12-24 DIAGNOSIS — S82421A Displaced transverse fracture of shaft of right fibula, initial encounter for closed fracture: Secondary | ICD-10-CM | POA: Diagnosis not present

## 2022-12-24 DIAGNOSIS — S9031XA Contusion of right foot, initial encounter: Secondary | ICD-10-CM | POA: Diagnosis not present

## 2022-12-24 DIAGNOSIS — I10 Essential (primary) hypertension: Secondary | ICD-10-CM

## 2022-12-24 MED ORDER — CARVEDILOL 6.25 MG PO TABS
ORAL_TABLET | ORAL | 0 refills | Status: DC
Start: 2022-12-24 — End: 2023-03-11

## 2022-12-24 NOTE — Progress Notes (Unsigned)
Chief Complaint: Right ankle pain     History of Present Illness:    Alejandra Hess is a 67 y.o. female presenting to clinic today for evaluation of right ankle pain.  Patient and her husband were just vacationing in [facility] and were playing golf there yesterday when she stepped in a hole on the golf course with her right foot.  Her ankle twisted and she immediately had difficulty weightbearing.  She does note swelling and bruising of the ankle and foot.  They just returned from the beach and have not had any prior evaluation.  She does state that there is some numbness in all 5 toes.  Has tried Advil as well as Benadryl for sleep.  She and her husband have an upcoming trip in 10 days to [place].   Surgical History:   None  PMH/PSH/Family History/Social History/Meds/Allergies:    Past Medical History:  Diagnosis Date   Allergy    Asthma    COVID-19    DM (diabetes mellitus) (HCC)    GERD (gastroesophageal reflux disease)    Heart murmur    as a child   HLD (hyperlipidemia)    HSV-1 infection    Hypertension    Pneumonia    Past Surgical History:  Procedure Laterality Date   84 HOUR PH STUDY N/A 11/18/2019   Procedure: 24 HOUR PH STUDY;  Surgeon: Lemar Lofty., MD;  Location: [facility];  Service: Gastroenterology;  Laterality: N/A;   ABDOMINAL HYSTERECTOMY     AUGMENTATION MAMMAPLASTY Bilateral    @ 43 lift with implants   BREAST EXCISIONAL BIOPSY Right    @ 53   BREAST EXCISIONAL BIOPSY Left    @43 ?   CHOLECYSTECTOMY     COLONOSCOPY     ESOPHAGEAL MANOMETRY N/A 11/18/2019   Procedure: ESOPHAGEAL MANOMETRY (EM);  Surgeon: Lemar Lofty., MD;  Location: [facility];  Service: Gastroenterology;  Laterality: N/A;   LAPAROSCOPIC APPENDECTOMY N/A 01/29/2022   Procedure: LAPAROSCOPIC APPENDECTOMY;  Surgeon: Andria Meuse, MD;  Location: [facility];  Service: General;  Laterality: N/A;   UPPER GASTROINTESTINAL  ENDOSCOPY     Social History   Socioeconomic History   Marital status: Married    Spouse name: Not on file   Number of children: 2   Years of education: Not on file   Highest education level: Master's degree (e.g., MA, MS, MEng, MEd, MSW, MBA)  Occupational History   Occupation: RN  Tobacco Use   Smoking status: Former    Current packs/day: 0.00    Types: Cigarettes    Quit date: 2013    Years since quitting: 11.8   Smokeless tobacco: Never  Vaping Use   Vaping status: Never Used  Substance and Sexual Activity   Alcohol use: Yes    Comment: 2 glasses of red wine daily   Drug use: Never   Sexual activity: Yes    Partners: Male    Birth control/protection: Surgical    Comment: 1st intercourse- 15, partners- 5-, MARRIED- 40, hysterectomy  Other Topics Concern   Not on file  Social History Narrative   Not on file   Social Determinants of Health   Financial Resource Strain: Low Risk  (05/24/2022)   Overall Financial Resource Strain (CARDIA)    Difficulty of Paying Living Expenses: Not hard  at all  Food Insecurity: No Food Insecurity (05/24/2022)   Hunger Vital Sign    Worried About Running Out of Food in the Last Year: Never true    Ran Out of Food in the Last Year: Never true  Transportation Needs: No Transportation Needs (08/24/2022)   PRAPARE - Administrator, Civil Service (Medical): No    Lack of Transportation (Non-Medical): No  Physical Activity: Sufficiently Active (05/24/2022)   Exercise Vital Sign    Days of Exercise per Week: 3 days    Minutes of Exercise per Session: 50 min  Stress: No Stress Concern Present (05/24/2022)   [employer] of Occupational Health - Occupational Stress Questionnaire    Feeling of Stress : Not at all  Social Connections: Moderately Integrated (05/24/2022)   Social Connection and Isolation Panel [NHANES]    Frequency of Communication with Friends and Family: More than three times a week    Frequency of Social  Gatherings with Friends and Family: More than three times a week    Attends Religious Services: 1 to 4 times per year    Active Member of [employer] or Organizations: No    Attends Engineer, structural: Not on file    Marital Status: Married   Family History  Problem Relation Age of Onset   Hearing loss Mother    Hyperlipidemia Mother    Hypertension Mother    Diabetes Mother    Heart attack Father    Heart disease Father    Hyperlipidemia Father    Hypertension Father    Melanoma Father    Asthma Sister    Hyperlipidemia Sister    Hypertension Sister    Hyperlipidemia Sister    Breast cancer Sister 26   Asthma Sister    Depression Sister    Heart attack Sister    Heart disease Sister    Hypertension Sister    Diabetes Sister    Breast cancer Maternal Aunt        over 57   Breast cancer Paternal Aunt        over 68   Breast cancer Paternal Grandmother        over 44    Breast cancer Maternal Aunt        over 50    Colon cancer Maternal Grandfather    Esophageal cancer Neg Hx    Inflammatory bowel disease Neg Hx    Liver disease Neg Hx    Pancreatic cancer Neg Hx    Rectal cancer Neg Hx    Stomach cancer Neg Hx    Allergies  Allergen Reactions   Azithromycin Rash   Chlorhexidine Gluconate Rash   Jardiance [Empagliflozin] Other (See Comments)    Yeast vaginitis   Penicillins Hives and Rash   Current Outpatient Medications  Medication Sig Dispense Refill   albuterol (PROVENTIL) (2.5 MG/3ML) 0.083% nebulizer solution Take 2.5 mg by nebulization every 6 (six) hours as needed for shortness of breath or wheezing. PRN  7   albuterol (VENTOLIN HFA) 108 (90 Base) MCG/ACT inhaler Inhale 1-2 puffs into the lungs every 6 (six) hours as needed for wheezing or shortness of breath.     budesonide-formoterol (SYMBICORT) 160-4.5 MCG/ACT inhaler Inhale 1 puff into the lungs at bedtime as needed. 1 Inhaler 3   carvedilol (COREG) 6.25 MG tablet TAKE ONE TABLET BY MOUTH TWICE A  DAY WITH A MEAL 180 tablet 0   cetirizine (ZYRTEC) 10 MG tablet Take 10 mg by mouth  daily.     glucose blood (ONETOUCH VERIO) test strip Check blood sugar before 3 meals and at bedtime. 100 each 12   Lancets (ONETOUCH DELICA PLUS LANCET33G) MISC 1 each by Does not apply route in the morning, at noon, in the evening, and at bedtime. 100 each 3   magnesium oxide (MAG-OX) 400 MG tablet Take 400 mg by mouth daily.     metFORMIN (GLUCOPHAGE-XR) 750 MG 24 hr tablet TAKE ONE TABLET BY MOUTH TWICE A DAY AFTER A MEAL 180 tablet 1   Multiple Vitamin (MULTIVITAMIN) tablet Take 1 tablet by mouth daily.     olmesartan (BENICAR) 40 MG tablet TAKE 1 TABLET DAILY 90 tablet 2   pantoprazole (PROTONIX) 40 MG tablet Take 1 tablet (40 mg total) by mouth daily. Please call [phone] for further refills. 90 tablet 3   rosuvastatin (CRESTOR) 20 MG tablet TAKE ONE TABLET BY MOUTH ONE TIME DAILY 90 tablet 1   tirzepatide (MOUNJARO) 10 MG/0.5ML Pen Inject 10 mg into the skin once a week. 6 mL 1   triamcinolone (NASACORT) 55 MCG/ACT AERO nasal inhaler Place 1 spray into the nose daily.     valACYclovir (VALTREX) 1000 MG tablet Take 1/2 tablet 2 times daily for 3 days as needed for outbreaks. 9 tablet 0   venlafaxine XR (EFFEXOR-XR) 75 MG 24 hr capsule TAKE ONE CAPSULE BY MOUTH ONE TIME DAILY WITH BREAKFAST 90 capsule 1   No current facility-administered medications for this visit.   No results found.  Review of Systems:   A ROS was performed including pertinent positives and negatives as documented in the HPI.  Physical Exam :   Constitutional: NAD and appears stated age Neurological: Alert and oriented Psych: Appropriate affect and cooperative There were no vitals taken for this visit.   Comprehensive Musculoskeletal Exam:    Lateral right ankle appears moderately swollen with ecchymosis extending down into the lateral foot.  Tenderness palpation over the lateral malleolus.  Active range of motion of the  ankle to 10 degrees dorsiflexion and plantarflexion.  Negative anterior drawer.  Dorsalis pedis pulse 2+ with brisk capillary refill to all 5 toes.  Decreased sensation noted over the lateral foot into all 5 toes.  Imaging:   Xray (right ankle 3 views): Small avulsion fracture of the tip of the distal fibula.  Lateral soft tissue edema.   I personally reviewed and interpreted the radiographs.   Assessment:   67 y.o. female with right ankle pain and swelling after an inversion injury yesterday.  There does appear to be a small avulsion fracture of the tip of the distal fibula.  Due to the mechanism this appears to be associated with a moderate ankle sprain.  Given the presence of the fracture I would like to place her in a short walking boot today for immobilization.  She can progress weightbearing as tolerated.  I have also recommended continuing with Advil and Tylenol for pain control.  I will plan to see her back in 4 weeks for repeat x-ray and reassessment.  Plan :    -Placed in short walking boot today -Return to clinic in 4 weeks for repeat x-ray and follow-up     I personally saw and evaluated the patient, and participated in the management and treatment plan.  Hazle Nordmann, PA-C Orthopedics

## 2022-12-28 ENCOUNTER — Other Ambulatory Visit: Payer: Self-pay | Admitting: Gastroenterology

## 2023-01-01 ENCOUNTER — Encounter (HOSPITAL_BASED_OUTPATIENT_CLINIC_OR_DEPARTMENT_OTHER): Payer: Self-pay | Admitting: Student

## 2023-01-01 ENCOUNTER — Ambulatory Visit (HOSPITAL_BASED_OUTPATIENT_CLINIC_OR_DEPARTMENT_OTHER): Payer: Medicare Other

## 2023-01-01 ENCOUNTER — Ambulatory Visit (HOSPITAL_BASED_OUTPATIENT_CLINIC_OR_DEPARTMENT_OTHER): Payer: Medicare Other | Admitting: Student

## 2023-01-01 DIAGNOSIS — S82831A Other fracture of upper and lower end of right fibula, initial encounter for closed fracture: Secondary | ICD-10-CM

## 2023-01-01 DIAGNOSIS — M25571 Pain in right ankle and joints of right foot: Secondary | ICD-10-CM | POA: Diagnosis not present

## 2023-01-01 DIAGNOSIS — S82891D Other fracture of right lower leg, subsequent encounter for closed fracture with routine healing: Secondary | ICD-10-CM | POA: Diagnosis not present

## 2023-01-01 NOTE — Progress Notes (Signed)
Chief Complaint: Right ankle pain     History of Present Illness:   01/01/23: Patient presents today for follow up of her right ankle.  Overall she reports doing much better.  Her pain has decreased over the past few days and is mild today without use of pain medication.  She also states that her swelling has improved.  Has been able to weight-bear in the walking boot and is tolerating this well.  She is icing and elevating when possible.  Her and her husband leave for an international trip tomorrow.   12/24/2022: Alejandra Hess is a 67 y.o. female presenting to clinic today for evaluation of right ankle pain.  Patient and her husband were just vacationing in [facility] and were playing golf there yesterday when she stepped in a hole on the golf course with her right foot.  Her ankle twisted and she immediately had difficulty weightbearing.  She does note swelling and bruising of the ankle and foot.  They just returned from the beach and have not had any prior evaluation.  She does state that there is some numbness in all 5 toes.  Has tried Advil as well as Benadryl for sleep.  She and her husband have an upcoming trip in 10 days to [place].   Surgical History:   None  PMH/PSH/Family History/Social History/Meds/Allergies:    Past Medical History:  Diagnosis Date   Allergy    Asthma    COVID-19    DM (diabetes mellitus) (HCC)    GERD (gastroesophageal reflux disease)    Heart murmur    as a child   HLD (hyperlipidemia)    HSV-1 infection    Hypertension    Pneumonia    Past Surgical History:  Procedure Laterality Date   36 HOUR PH STUDY N/A 11/18/2019   Procedure: 24 HOUR PH STUDY;  Surgeon: Lemar Lofty., MD;  Location: [facility];  Service: Gastroenterology;  Laterality: N/A;   ABDOMINAL HYSTERECTOMY     AUGMENTATION MAMMAPLASTY Bilateral    @ 43 lift with implants   BREAST EXCISIONAL BIOPSY Right    @ 53   BREAST EXCISIONAL  BIOPSY Left    @43 ?   CHOLECYSTECTOMY     COLONOSCOPY     ESOPHAGEAL MANOMETRY N/A 11/18/2019   Procedure: ESOPHAGEAL MANOMETRY (EM);  Surgeon: Lemar Lofty., MD;  Location: [facility];  Service: Gastroenterology;  Laterality: N/A;   LAPAROSCOPIC APPENDECTOMY N/A 01/29/2022   Procedure: LAPAROSCOPIC APPENDECTOMY;  Surgeon: Andria Meuse, MD;  Location: [facility];  Service: General;  Laterality: N/A;   UPPER GASTROINTESTINAL ENDOSCOPY     Social History   Socioeconomic History   Marital status: Married    Spouse name: Not on file   Number of children: 2   Years of education: Not on file   Highest education level: Master's degree (e.g., MA, MS, MEng, MEd, MSW, MBA)  Occupational History   Occupation: RN  Tobacco Use   Smoking status: Former    Current packs/day: 0.00    Types: Cigarettes    Quit date: 2013    Years since quitting: 11.8   Smokeless tobacco: Never  Vaping Use   Vaping status: Never Used  Substance and Sexual Activity   Alcohol use: Yes    Comment: 2 glasses of red wine daily  Drug use: Never   Sexual activity: Yes    Partners: Male    Birth control/protection: Surgical    Comment: 1st intercourse- 15, partners- 5-, MARRIED- 40, hysterectomy  Other Topics Concern   Not on file  Social History Narrative   Not on file   Social Determinants of Health   Financial Resource Strain: Low Risk  (05/24/2022)   Overall Financial Resource Strain (CARDIA)    Difficulty of Paying Living Expenses: Not hard at all  Food Insecurity: No Food Insecurity (05/24/2022)   Hunger Vital Sign    Worried About Running Out of Food in the Last Year: Never true    Ran Out of Food in the Last Year: Never true  Transportation Needs: No Transportation Needs (08/24/2022)   PRAPARE - Administrator, Civil Service (Medical): No    Lack of Transportation (Non-Medical): No  Physical Activity: Sufficiently Active (05/24/2022)   Exercise Vital Sign    Days of  Exercise per Week: 3 days    Minutes of Exercise per Session: 50 min  Stress: No Stress Concern Present (05/24/2022)   [employer] of Occupational Health - Occupational Stress Questionnaire    Feeling of Stress : Not at all  Social Connections: Moderately Integrated (05/24/2022)   Social Connection and Isolation Panel [NHANES]    Frequency of Communication with Friends and Family: More than three times a week    Frequency of Social Gatherings with Friends and Family: More than three times a week    Attends Religious Services: 1 to 4 times per year    Active Member of [employer] or Organizations: No    Attends Engineer, structural: Not on file    Marital Status: Married   Family History  Problem Relation Age of Onset   Hearing loss Mother    Hyperlipidemia Mother    Hypertension Mother    Diabetes Mother    Heart attack Father    Heart disease Father    Hyperlipidemia Father    Hypertension Father    Melanoma Father    Asthma Sister    Hyperlipidemia Sister    Hypertension Sister    Hyperlipidemia Sister    Breast cancer Sister 51   Asthma Sister    Depression Sister    Heart attack Sister    Heart disease Sister    Hypertension Sister    Diabetes Sister    Breast cancer Maternal Aunt        over 44   Breast cancer Paternal Aunt        over 71   Breast cancer Paternal Grandmother        over 57    Breast cancer Maternal Aunt        over 62    Colon cancer Maternal Grandfather    Esophageal cancer Neg Hx    Inflammatory bowel disease Neg Hx    Liver disease Neg Hx    Pancreatic cancer Neg Hx    Rectal cancer Neg Hx    Stomach cancer Neg Hx    Allergies  Allergen Reactions   Azithromycin Rash   Chlorhexidine Gluconate Rash   Jardiance [Empagliflozin] Other (See Comments)    Yeast vaginitis   Penicillins Hives and Rash   Current Outpatient Medications  Medication Sig Dispense Refill   albuterol (PROVENTIL) (2.5 MG/3ML) 0.083% nebulizer solution Take  2.5 mg by nebulization every 6 (six) hours as needed for shortness of breath or wheezing. PRN  7  albuterol (VENTOLIN HFA) 108 (90 Base) MCG/ACT inhaler Inhale 1-2 puffs into the lungs every 6 (six) hours as needed for wheezing or shortness of breath.     budesonide-formoterol (SYMBICORT) 160-4.5 MCG/ACT inhaler Inhale 1 puff into the lungs at bedtime as needed. 1 Inhaler 3   carvedilol (COREG) 6.25 MG tablet TAKE ONE TABLET BY MOUTH TWICE A DAY WITH A MEAL 180 tablet 0   cetirizine (ZYRTEC) 10 MG tablet Take 10 mg by mouth daily.     glucose blood (ONETOUCH VERIO) test strip Check blood sugar before 3 meals and at bedtime. 100 each 12   Lancets (ONETOUCH DELICA PLUS LANCET33G) MISC 1 each by Does not apply route in the morning, at noon, in the evening, and at bedtime. 100 each 3   magnesium oxide (MAG-OX) 400 MG tablet Take 400 mg by mouth daily.     metFORMIN (GLUCOPHAGE-XR) 750 MG 24 hr tablet TAKE ONE TABLET BY MOUTH TWICE A DAY AFTER A MEAL 180 tablet 1   Multiple Vitamin (MULTIVITAMIN) tablet Take 1 tablet by mouth daily.     olmesartan (BENICAR) 40 MG tablet TAKE 1 TABLET DAILY 90 tablet 2   pantoprazole (PROTONIX) 40 MG tablet Take 1 tablet (40 mg total) by mouth daily. Please call [phone] for further refills. 90 tablet 3   rosuvastatin (CRESTOR) 20 MG tablet TAKE ONE TABLET BY MOUTH ONE TIME DAILY 90 tablet 1   tirzepatide (MOUNJARO) 10 MG/0.5ML Pen Inject 10 mg into the skin once a week. 6 mL 1   triamcinolone (NASACORT) 55 MCG/ACT AERO nasal inhaler Place 1 spray into the nose daily.     valACYclovir (VALTREX) 1000 MG tablet Take 1/2 tablet 2 times daily for 3 days as needed for outbreaks. 9 tablet 0   venlafaxine XR (EFFEXOR-XR) 75 MG 24 hr capsule TAKE 1 CAPSULE DAILY WITH BREAKFAST 90 capsule 3   No current facility-administered medications for this visit.   No results found.  Review of Systems:   A ROS was performed including pertinent positives and negatives as documented  in the HPI.  Physical Exam :   Constitutional: NAD and appears stated age Neurological: Alert and oriented Psych: Appropriate affect and cooperative There were no vitals taken for this visit.   Comprehensive Musculoskeletal Exam:    Minimal swelling noted of the right ankle with no evidence of erythema or ecchymosis.  Point tenderness directly over the distal tip of the fibula without discomfort over the lateral ankle ligaments.  Active ankle range of motion to 10 degrees dorsiflexion and 20 degrees plantarflexion.  No pain with passive ankle inversion and eversion.  Imaging:   Xray (right ankle 3 views): Minimal change in distal fibula avulsion fracture.  Decrease in soft tissue edema from prior xray.   I personally reviewed and interpreted the radiographs.   Assessment:   67 y.o. female now over 1 week status post avulsion fracture of the right distal fibular tip.  She is here today for reevaluation due to leaving on a 10-day international trip tomorrow.  Overall her symptoms are improving steadily.  She did request to have x-rays done today which do not yet show any callus.  Given her improvement I do believe it is reasonable to slowly progress into an ankle brace as tolerated.  I have encouraged her to take her boot for traveling in case of increased pain or soreness.  I did give her a lace up ankle brace which she tolerated well in clinic.  Will plan  to see her back in 4 weeks for reassessment.  Plan :    -Return to clinic in 4 weeks for reevaluation     I personally saw and evaluated the patient, and participated in the management and treatment plan.  Hazle Nordmann, PA-C Orthopedics

## 2023-01-30 ENCOUNTER — Ambulatory Visit (HOSPITAL_BASED_OUTPATIENT_CLINIC_OR_DEPARTMENT_OTHER): Payer: Medicare Other | Admitting: Student

## 2023-01-30 ENCOUNTER — Ambulatory Visit (HOSPITAL_BASED_OUTPATIENT_CLINIC_OR_DEPARTMENT_OTHER): Payer: Medicare Other

## 2023-01-30 ENCOUNTER — Encounter (HOSPITAL_BASED_OUTPATIENT_CLINIC_OR_DEPARTMENT_OTHER): Payer: Self-pay | Admitting: Student

## 2023-01-30 DIAGNOSIS — M25571 Pain in right ankle and joints of right foot: Secondary | ICD-10-CM

## 2023-01-30 DIAGNOSIS — S82831A Other fracture of upper and lower end of right fibula, initial encounter for closed fracture: Secondary | ICD-10-CM

## 2023-01-30 DIAGNOSIS — S82401D Unspecified fracture of shaft of right fibula, subsequent encounter for closed fracture with routine healing: Secondary | ICD-10-CM | POA: Diagnosis not present

## 2023-01-30 NOTE — Progress Notes (Signed)
Chief Complaint: Right ankle pain     History of Present Illness:   01/30/23: Alejandra Hess is here today for follow-up of a right distal fibula fracture that occurred on 10/27.  Today she reports some continued mild pain and soreness but that it has continued to improve.  Recently got back from a trip to [place] and was able to get around well without complications.  Has been taking Advil and icing.  Wearing an ankle brace consistently which has been helping.   01/01/23: Patient presents today for follow up of her right ankle.  Overall she reports doing much better.  Her pain has decreased over the past few days and is mild today without use of pain medication.  She also states that her swelling has improved.  Has been able to weight-bear in the walking boot and is tolerating this well.  She is icing and elevating when possible.  Her and her husband leave for an international trip tomorrow.   Surgical History:   None  PMH/PSH/Family History/Social History/Meds/Allergies:    Past Medical History:  Diagnosis Date   Allergy    Asthma    COVID-19    DM (diabetes mellitus) (HCC)    GERD (gastroesophageal reflux disease)    Heart murmur    as a child   HLD (hyperlipidemia)    HSV-1 infection    Hypertension    Pneumonia    Past Surgical History:  Procedure Laterality Date   52 HOUR PH STUDY N/A 11/18/2019   Procedure: 24 HOUR PH STUDY;  Surgeon: Lemar Lofty., MD;  Location: [facility];  Service: Gastroenterology;  Laterality: N/A;   ABDOMINAL HYSTERECTOMY     AUGMENTATION MAMMAPLASTY Bilateral    @ 43 lift with implants   BREAST EXCISIONAL BIOPSY Right    @ 53   BREAST EXCISIONAL BIOPSY Left    @43 ?   CHOLECYSTECTOMY     COLONOSCOPY     ESOPHAGEAL MANOMETRY N/A 11/18/2019   Procedure: ESOPHAGEAL MANOMETRY (EM);  Surgeon: Lemar Lofty., MD;  Location: [facility];  Service: Gastroenterology;  Laterality: N/A;   LAPAROSCOPIC  APPENDECTOMY N/A 01/29/2022   Procedure: LAPAROSCOPIC APPENDECTOMY;  Surgeon: Andria Meuse, MD;  Location: [facility];  Service: General;  Laterality: N/A;   UPPER GASTROINTESTINAL ENDOSCOPY     Social History   Socioeconomic History   Marital status: Married    Spouse name: Not on file   Number of children: 2   Years of education: Not on file   Highest education level: Master's degree (e.g., MA, MS, MEng, MEd, MSW, MBA)  Occupational History   Occupation: RN  Tobacco Use   Smoking status: Former    Current packs/day: 0.00    Types: Cigarettes    Quit date: 2013    Years since quitting: 11.9   Smokeless tobacco: Never  Vaping Use   Vaping status: Never Used  Substance and Sexual Activity   Alcohol use: Yes    Comment: 2 glasses of red wine daily   Drug use: Never   Sexual activity: Yes    Partners: Male    Birth control/protection: Surgical    Comment: 1st intercourse- 15, partners- 5-, MARRIED- 40, hysterectomy  Other Topics Concern   Not on file  Social History Narrative   Not on file   Social  Determinants of Health   Financial Resource Strain: Low Risk  (05/24/2022)   Overall Financial Resource Strain (CARDIA)    Difficulty of Paying Living Expenses: Not hard at all  Food Insecurity: No Food Insecurity (05/24/2022)   Hunger Vital Sign    Worried About Running Out of Food in the Last Year: Never true    Ran Out of Food in the Last Year: Never true  Transportation Needs: No Transportation Needs (08/24/2022)   PRAPARE - Administrator, Civil Service (Medical): No    Lack of Transportation (Non-Medical): No  Physical Activity: Sufficiently Active (05/24/2022)   Exercise Vital Sign    Days of Exercise per Week: 3 days    Minutes of Exercise per Session: 50 min  Stress: No Stress Concern Present (05/24/2022)   [employer] of Occupational Health - Occupational Stress Questionnaire    Feeling of Stress : Not at all  Social Connections: Moderately  Integrated (05/24/2022)   Social Connection and Isolation Panel [NHANES]    Frequency of Communication with Friends and Family: More than three times a week    Frequency of Social Gatherings with Friends and Family: More than three times a week    Attends Religious Services: 1 to 4 times per year    Active Member of [employer] or Organizations: No    Attends Engineer, structural: Not on file    Marital Status: Married   Family History  Problem Relation Age of Onset   Hearing loss Mother    Hyperlipidemia Mother    Hypertension Mother    Diabetes Mother    Heart attack Father    Heart disease Father    Hyperlipidemia Father    Hypertension Father    Melanoma Father    Asthma Sister    Hyperlipidemia Sister    Hypertension Sister    Hyperlipidemia Sister    Breast cancer Sister 63   Asthma Sister    Depression Sister    Heart attack Sister    Heart disease Sister    Hypertension Sister    Diabetes Sister    Breast cancer Maternal Aunt        over 31   Breast cancer Paternal Aunt        over 20   Breast cancer Paternal Grandmother        over 18    Breast cancer Maternal Aunt        over 12    Colon cancer Maternal Grandfather    Esophageal cancer Neg Hx    Inflammatory bowel disease Neg Hx    Liver disease Neg Hx    Pancreatic cancer Neg Hx    Rectal cancer Neg Hx    Stomach cancer Neg Hx    Allergies  Allergen Reactions   Azithromycin Rash   Chlorhexidine Gluconate Rash   Jardiance [Empagliflozin] Other (See Comments)    Yeast vaginitis   Penicillins Hives and Rash   Current Outpatient Medications  Medication Sig Dispense Refill   albuterol (PROVENTIL) (2.5 MG/3ML) 0.083% nebulizer solution Take 2.5 mg by nebulization every 6 (six) hours as needed for shortness of breath or wheezing. PRN  7   albuterol (VENTOLIN HFA) 108 (90 Base) MCG/ACT inhaler Inhale 1-2 puffs into the lungs every 6 (six) hours as needed for wheezing or shortness of breath.      budesonide-formoterol (SYMBICORT) 160-4.5 MCG/ACT inhaler Inhale 1 puff into the lungs at bedtime as needed. 1 Inhaler 3   carvedilol (COREG)  6.25 MG tablet TAKE ONE TABLET BY MOUTH TWICE A DAY WITH A MEAL 180 tablet 0   cetirizine (ZYRTEC) 10 MG tablet Take 10 mg by mouth daily.     glucose blood (ONETOUCH VERIO) test strip Check blood sugar before 3 meals and at bedtime. 100 each 12   Lancets (ONETOUCH DELICA PLUS LANCET33G) MISC 1 each by Does not apply route in the morning, at noon, in the evening, and at bedtime. 100 each 3   magnesium oxide (MAG-OX) 400 MG tablet Take 400 mg by mouth daily.     metFORMIN (GLUCOPHAGE-XR) 750 MG 24 hr tablet TAKE ONE TABLET BY MOUTH TWICE A DAY AFTER A MEAL 180 tablet 1   Multiple Vitamin (MULTIVITAMIN) tablet Take 1 tablet by mouth daily.     olmesartan (BENICAR) 40 MG tablet TAKE 1 TABLET DAILY 90 tablet 2   pantoprazole (PROTONIX) 40 MG tablet Take 1 tablet (40 mg total) by mouth daily. Please call [phone] for further refills. 90 tablet 3   rosuvastatin (CRESTOR) 20 MG tablet TAKE ONE TABLET BY MOUTH ONE TIME DAILY 90 tablet 1   tirzepatide (MOUNJARO) 10 MG/0.5ML Pen Inject 10 mg into the skin once a week. 6 mL 1   triamcinolone (NASACORT) 55 MCG/ACT AERO nasal inhaler Place 1 spray into the nose daily.     valACYclovir (VALTREX) 1000 MG tablet Take 1/2 tablet 2 times daily for 3 days as needed for outbreaks. 9 tablet 0   venlafaxine XR (EFFEXOR-XR) 75 MG 24 hr capsule TAKE 1 CAPSULE DAILY WITH BREAKFAST 90 capsule 3   No current facility-administered medications for this visit.   No results found.  Review of Systems:   A ROS was performed including pertinent positives and negatives as documented in the HPI.  Physical Exam :   Constitutional: NAD and appears stated age Neurological: Alert and oriented Psych: Appropriate affect and cooperative There were no vitals taken for this visit.   Comprehensive Musculoskeletal Exam:    Mild tenderness  to palpation and swelling over the distal fibula of the right ankle.  Active ankle ROM to 20 degrees dorsiflexion 30 degrees plantarflexion.  Dorsalis pedis 2+.  Neurosensory exam intact.  Imaging:   Xray (right ankle 3 views): Fracture of the distal tip of the fibula not evidence of any further displacement or significant callus.   I personally reviewed and interpreted the radiographs.   Assessment:   67 y.o. female now over 5 weeks status post fracture to the right distal fibular tip.  She is symptomatically improving and has now been able to ambulate comfortably with use of an ankle brace.  X-rays still do not show any significant evidence of bone healing, so I would like to continue to monitor this.  Monitor continue in the brace and have her return in another 6 weeks to recheck x-rays.  She can let us know sooner if she has any issues before then.  Plan :    -Return to clinic in 6 weeks for repeat xray and reassessment     I personally saw and evaluated the patient, and participated in the management and treatment plan.  Hazle Nordmann, PA-C Orthopedics

## 2023-02-05 ENCOUNTER — Other Ambulatory Visit: Payer: Self-pay | Admitting: Family Medicine

## 2023-02-05 DIAGNOSIS — Z1231 Encounter for screening mammogram for malignant neoplasm of breast: Secondary | ICD-10-CM

## 2023-02-18 ENCOUNTER — Other Ambulatory Visit: Payer: Self-pay | Admitting: Family Medicine

## 2023-02-18 DIAGNOSIS — I10 Essential (primary) hypertension: Secondary | ICD-10-CM

## 2023-02-28 ENCOUNTER — Ambulatory Visit: Payer: BLUE CROSS/BLUE SHIELD | Admitting: Family Medicine

## 2023-03-01 ENCOUNTER — Other Ambulatory Visit: Payer: Self-pay | Admitting: Family Medicine

## 2023-03-01 DIAGNOSIS — Z1231 Encounter for screening mammogram for malignant neoplasm of breast: Secondary | ICD-10-CM

## 2023-03-02 ENCOUNTER — Encounter: Payer: Self-pay | Admitting: Family Medicine

## 2023-03-04 ENCOUNTER — Other Ambulatory Visit (HOSPITAL_COMMUNITY): Payer: Self-pay

## 2023-03-04 ENCOUNTER — Telehealth: Payer: Self-pay

## 2023-03-04 ENCOUNTER — Encounter: Payer: Self-pay | Admitting: Family Medicine

## 2023-03-04 NOTE — Telephone Encounter (Signed)
 PA started on cover my meds.  Awaiting response.  Dm/cma  Key: [id number]

## 2023-03-04 NOTE — Telephone Encounter (Signed)
 Pharmacy Patient Advocate Encounter   Received notification from CoverMyMeds that prior authorization for Mounjaro  10MG /0.5ML auto-injectors is required/requested.   Insurance verification completed.   The patient is insured through [facility] .   Per test claim: PA required; PA submitted to above mentioned insurance via CoverMyMeds Key/confirmation #/EOC [id number] Status is pending

## 2023-03-05 ENCOUNTER — Other Ambulatory Visit: Payer: Self-pay | Admitting: Family Medicine

## 2023-03-05 DIAGNOSIS — E7800 Pure hypercholesterolemia, unspecified: Secondary | ICD-10-CM

## 2023-03-06 NOTE — Telephone Encounter (Signed)
 Per plan: We have a prior authorization request pending review for the requested drug for this member. For questions regarding this member request, please contact the phone number on the back of the prescription ID card.

## 2023-03-11 ENCOUNTER — Encounter: Payer: Self-pay | Admitting: Family Medicine

## 2023-03-11 ENCOUNTER — Ambulatory Visit (HOSPITAL_BASED_OUTPATIENT_CLINIC_OR_DEPARTMENT_OTHER): Payer: Medicare Other | Admitting: Student

## 2023-03-11 ENCOUNTER — Ambulatory Visit (HOSPITAL_BASED_OUTPATIENT_CLINIC_OR_DEPARTMENT_OTHER): Payer: Medicare Other

## 2023-03-11 ENCOUNTER — Ambulatory Visit (INDEPENDENT_AMBULATORY_CARE_PROVIDER_SITE_OTHER): Payer: Medicare Other | Admitting: Family Medicine

## 2023-03-11 DIAGNOSIS — S82831A Other fracture of upper and lower end of right fibula, initial encounter for closed fracture: Secondary | ICD-10-CM

## 2023-03-11 DIAGNOSIS — E7800 Pure hypercholesterolemia, unspecified: Secondary | ICD-10-CM | POA: Diagnosis not present

## 2023-03-11 DIAGNOSIS — I10 Essential (primary) hypertension: Secondary | ICD-10-CM | POA: Diagnosis not present

## 2023-03-11 DIAGNOSIS — Z7985 Long-term (current) use of injectable non-insulin antidiabetic drugs: Secondary | ICD-10-CM

## 2023-03-11 DIAGNOSIS — E119 Type 2 diabetes mellitus without complications: Secondary | ICD-10-CM

## 2023-03-11 DIAGNOSIS — Z7984 Long term (current) use of oral hypoglycemic drugs: Secondary | ICD-10-CM | POA: Diagnosis not present

## 2023-03-11 DIAGNOSIS — S82401D Unspecified fracture of shaft of right fibula, subsequent encounter for closed fracture with routine healing: Secondary | ICD-10-CM | POA: Diagnosis not present

## 2023-03-11 LAB — URINALYSIS, ROUTINE W REFLEX MICROSCOPIC
Bilirubin Urine: NEGATIVE
Hgb urine dipstick: NEGATIVE
Ketones, ur: NEGATIVE
Leukocytes,Ua: NEGATIVE
Nitrite: NEGATIVE
Specific Gravity, Urine: 1.025 (ref 1.000–1.030)
Total Protein, Urine: NEGATIVE
Urine Glucose: NEGATIVE
Urobilinogen, UA: 0.2 (ref 0.0–1.0)
pH: 6.5 (ref 5.0–8.0)

## 2023-03-11 LAB — COMPREHENSIVE METABOLIC PANEL
ALT: 25 U/L (ref 0–35)
AST: 20 U/L (ref 0–37)
Albumin: 4.5 g/dL (ref 3.5–5.2)
Alkaline Phosphatase: 56 U/L (ref 39–117)
BUN: 24 mg/dL — ABNORMAL HIGH (ref 6–23)
CO2: 29 meq/L (ref 19–32)
Calcium: 9.4 mg/dL (ref 8.4–10.5)
Chloride: 103 meq/L (ref 96–112)
Creatinine, Ser: 0.69 mg/dL (ref 0.40–1.20)
GFR: 88.43 mL/min (ref >60.00)
Glucose, Bld: 91 mg/dL (ref 70–99)
Potassium: 3.7 meq/L (ref 3.5–5.1)
Sodium: 138 meq/L (ref 135–145)
Total Bilirubin: 0.4 mg/dL (ref 0.2–1.2)
Total Protein: 7 g/dL (ref 6.0–8.3)

## 2023-03-11 LAB — LIPID PANEL
Cholesterol: 117 mg/dL (ref 0–200)
HDL: 46.4 mg/dL (ref >39.00)
LDL Cholesterol: 52 mg/dL (ref 0–99)
NonHDL: 71.05
Total CHOL/HDL Ratio: 3
Triglycerides: 96 mg/dL (ref 0.0–149.0)
VLDL: 19.2 mg/dL (ref 0.0–40.0)

## 2023-03-11 LAB — MICROALBUMIN / CREATININE URINE RATIO
Creatinine,U: 150.2 mg/dL
Microalb Creat Ratio: 0.7 mg/g (ref 0.0–30.0)
Microalb, Ur: 1.1 mg/dL (ref 0.0–1.9)

## 2023-03-11 LAB — HEMOGLOBIN A1C: Hgb A1c MFr Bld: 6.5 % (ref 4.6–6.5)

## 2023-03-11 MED ORDER — METFORMIN HCL ER 750 MG PO TB24: ORAL_TABLET | ORAL | 1 refills | Status: DC

## 2023-03-11 MED ORDER — ROSUVASTATIN CALCIUM 20 MG PO TABS: 20.0000 mg | ORAL_TABLET | Freq: Every day | ORAL | 3 refills | Status: DC

## 2023-03-11 MED ORDER — OLMESARTAN MEDOXOMIL 40 MG PO TABS: 40.0000 mg | ORAL_TABLET | Freq: Every day | ORAL | 3 refills | Status: DC

## 2023-03-11 MED ORDER — TIRZEPATIDE 10 MG/0.5ML ~~LOC~~ SOAJ: 10.0000 mg | SUBCUTANEOUS | 1 refills | Status: DC

## 2023-03-11 MED ORDER — CARVEDILOL 6.25 MG PO TABS: ORAL_TABLET | ORAL | 0 refills | Status: DC

## 2023-03-11 NOTE — Progress Notes (Signed)
 Established Patient Office Visit   Subjective:  Patient ID: Alejandra Hess, female    DOB: 22-Sep-1955  Age: 68 y.o. MRN: [id number]  Chief Complaint  Patient presents with   Medical Management of Chronic Issues    6 month folow up. Diabetes. Pt has no concerns. Pt states he noticed another on the left testicle.     HPI Encounter Diagnoses  Name Primary?   Essential hypertension    Controlled type 2 diabetes mellitus without complication, without long-term current use of insulin (HCC)    Type 2 diabetes mellitus without complication, without long-term current use of insulin (HCC)    Elevated LDL cholesterol level    For follow-up of above.  Continues on medications as directed.  Claims compliance with rosuvastatin  but she is taking daily.  Finally recovering from a fracture of her right lateral malleolus.   Review of Systems  Constitutional: Negative.   HENT: Negative.    Eyes:  Negative for blurred vision, discharge and redness.  Respiratory: Negative.    Cardiovascular: Negative.   Gastrointestinal:  Negative for abdominal pain.  Genitourinary: Negative.   Musculoskeletal: Negative.  Negative for myalgias.  Skin:  Negative for rash.  Neurological:  Negative for tingling, loss of consciousness and weakness.  Endo/Heme/Allergies:  Negative for polydipsia.     Current Outpatient Medications:    albuterol  (PROVENTIL ) (2.5 MG/3ML) 0.083% nebulizer solution, Take 2.5 mg by nebulization every 6 (six) hours as needed for shortness of breath or wheezing. PRN, Disp: , Rfl: 7   albuterol  (VENTOLIN  HFA) 108 (90 Base) MCG/ACT inhaler, Inhale 1-2 puffs into the lungs every 6 (six) hours as needed for wheezing or shortness of breath., Disp: , Rfl:    cetirizine (ZYRTEC) 10 MG tablet, Take 10 mg by mouth daily., Disp: , Rfl:    glucose blood (ONETOUCH VERIO) test strip, Check blood sugar before 3 meals and at bedtime., Disp: 100 each, Rfl: 12   Lancets (ONETOUCH DELICA PLUS LANCET33G)  MISC, 1 each by Does not apply route in the morning, at noon, in the evening, and at bedtime., Disp: 100 each, Rfl: 3   magnesium oxide (MAG-OX) 400 MG tablet, Take 400 mg by mouth daily., Disp: , Rfl:    Multiple Vitamin (MULTIVITAMIN) tablet, Take 1 tablet by mouth daily., Disp: , Rfl:    pantoprazole  (PROTONIX ) 40 MG tablet, Take 1 tablet (40 mg total) by mouth daily. Please call [phone] for further refills., Disp: 90 tablet, Rfl: 3   triamcinolone  (NASACORT ) 55 MCG/ACT AERO nasal inhaler, Place 1 spray into the nose daily., Disp: , Rfl:    valACYclovir  (VALTREX ) 1000 MG tablet, Take 1/2 tablet 2 times daily for 3 days as needed for outbreaks., Disp: 9 tablet, Rfl: 0   venlafaxine  XR (EFFEXOR -XR) 75 MG 24 hr capsule, TAKE 1 CAPSULE DAILY WITH BREAKFAST, Disp: 90 capsule, Rfl: 3   budesonide -formoterol  (SYMBICORT ) 160-4.5 MCG/ACT inhaler, Inhale 1 puff into the lungs at bedtime as needed. (Patient not taking: Reported on 03/11/2023), Disp: 1 Inhaler, Rfl: 3   carvedilol  (COREG ) 6.25 MG tablet, TAKE ONE TABLET BY MOUTH TWICE A DAY WITH A MEAL, Disp: 180 tablet, Rfl: 0   metFORMIN  (GLUCOPHAGE -XR) 750 MG 24 hr tablet, TAKE ONE TABLET BY MOUTH TWICE A DAY AFTER A MEAL, Disp: 180 tablet, Rfl: 1   olmesartan  (BENICAR ) 40 MG tablet, Take 1 tablet (40 mg total) by mouth daily., Disp: 90 tablet, Rfl: 3   rosuvastatin  (CRESTOR ) 20 MG tablet, Take 1 tablet (20 mg  total) by mouth daily., Disp: 90 tablet, Rfl: 3   tirzepatide  (MOUNJARO ) 10 MG/0.5ML Pen, Inject 10 mg into the skin once a week., Disp: 6 mL, Rfl: 1   Objective:     BP 126/72   Pulse 65   Temp (!) 97.4 F (36.3 C)   Ht 5' 4 (1.626 m)   Wt 154 lb 12.8 oz (70.2 kg)   SpO2 96%   BMI 26.57 kg/m    Physical Exam Constitutional:      General: She is not in acute distress.    Appearance: Normal appearance. She is not ill-appearing, toxic-appearing or diaphoretic.  HENT:     Head: Normocephalic and atraumatic.     Right Ear: Tympanic  membrane, ear canal and external ear normal.     Left Ear: Tympanic membrane, ear canal and external ear normal.     Mouth/Throat:     Mouth: Mucous membranes are moist.     Pharynx: Oropharynx is clear. No oropharyngeal exudate or posterior oropharyngeal erythema.  Eyes:     General: No scleral icterus.       Right eye: No discharge.        Left eye: No discharge.     Extraocular Movements: Extraocular movements intact.     Conjunctiva/sclera: Conjunctivae normal.     Pupils: Pupils are equal, round, and reactive to light.  Cardiovascular:     Rate and Rhythm: Normal rate and regular rhythm.     Pulses:          Dorsalis pedis pulses are 2+ on the right side and 2+ on the left side.       Posterior tibial pulses are 1+ on the right side and 1+ on the left side.  Pulmonary:     Effort: Pulmonary effort is normal. No respiratory distress.     Breath sounds: Normal breath sounds.  Abdominal:     General: Bowel sounds are normal.     Tenderness: There is no abdominal tenderness. There is no guarding.  Musculoskeletal:     Cervical back: No rigidity or tenderness.  Skin:    General: Skin is warm and dry.  Neurological:     Mental Status: She is alert and oriented to person, place, and time.  Psychiatric:        Mood and Affect: Mood normal.        Behavior: Behavior normal.    Diabetic Foot Exam - Simple   Simple Foot Form Diabetic Foot exam was performed with the following findings: Yes 03/11/2023  2:07 PM  Visual Inspection See comments: Yes Sensation Testing Intact to touch and monofilament testing bilaterally: Yes Pulse Check Posterior Tibialis and Dorsalis pulse intact bilaterally: Yes Comments Feet are cavus bilaterally.  Sensory intact to light touch.       No results found for any visits on 03/11/23.    The ASCVD Risk score (Arnett DK, et al., 2019) failed to calculate for the following reasons:   Cannot find a previous HDL lab   Cannot find a previous total  cholesterol lab    Assessment & Plan:   Essential hypertension -     Microalbumin / creatinine urine ratio -     Urinalysis, Routine w reflex microscopic -     Carvedilol ; TAKE ONE TABLET BY MOUTH TWICE A DAY WITH A MEAL  Dispense: 180 tablet; Refill: 0 -     Olmesartan  Medoxomil; Take 1 tablet (40 mg total) by mouth daily.  Dispense: 90 tablet;  Refill: 3  Controlled type 2 diabetes mellitus without complication, without long-term current use of insulin (HCC) -     Hemoglobin A1c -     Comprehensive metabolic panel -     Microalbumin / creatinine urine ratio -     Urinalysis, Routine w reflex microscopic -     metFORMIN  HCl ER; TAKE ONE TABLET BY MOUTH TWICE A DAY AFTER A MEAL  Dispense: 180 tablet; Refill: 1 -     Tirzepatide ; Inject 10 mg into the skin once a week.  Dispense: 6 mL; Refill: 1  Type 2 diabetes mellitus without complication, without long-term current use of insulin (HCC) -     Hemoglobin A1c -     Comprehensive metabolic panel -     Microalbumin / creatinine urine ratio -     Urinalysis, Routine w reflex microscopic -     Tirzepatide ; Inject 10 mg into the skin once a week.  Dispense: 6 mL; Refill: 1  Elevated LDL cholesterol level -     Comprehensive metabolic panel -     Rosuvastatin  Calcium ; Take 1 tablet (20 mg total) by mouth daily.  Dispense: 90 tablet; Refill: 3 -     Lipid panel    Return May need to increase rosuvastatin  pending results of LDL today..  Medication adjustments will be made pending results of today's labs.  Elsie Sim Lent, MD

## 2023-03-11 NOTE — Progress Notes (Signed)
 Chief Complaint: Right ankle pain     History of Present Illness:   03/11/23: Patient presents today for follow-up of a right distal fibula avulsion that occurred approximately 10 weeks ago.  Overall she has had continued improvement and is now full weightbearing in normal shoes.  She does still have occasional mild pain and stiffness.  Continues to use brace or compression sock for most of the time.   01/30/23: Alejandra Hess is here today for follow-up of a right distal fibula fracture that occurred on 10/27.  Today she reports some continued mild pain and soreness but that it has continued to improve.  Recently got back from a trip to [place] and was able to get around well without complications.  Has been taking Advil and icing.  Wearing an ankle brace consistently which has been helping.   Surgical History:   None  PMH/PSH/Family History/Social History/Meds/Allergies:    Past Medical History:  Diagnosis Date   Allergy     Asthma    COVID-19    DM (diabetes mellitus) (HCC)    GERD (gastroesophageal reflux disease)    Heart murmur    as a child   HLD (hyperlipidemia)    HSV-1 infection    Hypertension    Pneumonia    Past Surgical History:  Procedure Laterality Date   28 HOUR PH STUDY N/A 11/18/2019   Procedure: 24 HOUR PH STUDY;  Surgeon: Wilhelmenia Aloha Raddle., MD;  Location: [facility];  Service: Gastroenterology;  Laterality: N/A;   ABDOMINAL HYSTERECTOMY     AUGMENTATION MAMMAPLASTY Bilateral    @ 43 lift with implants   BREAST EXCISIONAL BIOPSY Right    @ 53   BREAST EXCISIONAL BIOPSY Left    @43 ?   CHOLECYSTECTOMY     COLONOSCOPY     ESOPHAGEAL MANOMETRY N/A 11/18/2019   Procedure: ESOPHAGEAL MANOMETRY (EM);  Surgeon: Wilhelmenia Aloha Raddle., MD;  Location: [facility];  Service: Gastroenterology;  Laterality: N/A;   LAPAROSCOPIC APPENDECTOMY N/A 01/29/2022   Procedure: LAPAROSCOPIC APPENDECTOMY;  Surgeon: Teresa Lonni HERO, MD;   Location: [facility];  Service: General;  Laterality: N/A;   UPPER GASTROINTESTINAL ENDOSCOPY     Social History   Socioeconomic History   Marital status: Married    Spouse name: Not on file   Number of children: 2   Years of education: Not on file   Highest education level: Master's degree (e.g., MA, MS, MEng, MEd, MSW, MBA)  Occupational History   Occupation: RN  Tobacco Use   Smoking status: Former    Current packs/day: 0.00    Types: Cigarettes    Quit date: 2013    Years since quitting: 12.0   Smokeless tobacco: Never  Vaping Use   Vaping status: Never Used  Substance and Sexual Activity   Alcohol use: Yes    Comment: 2 glasses of red wine daily   Drug use: Never   Sexual activity: Yes    Partners: Male    Birth control/protection: Surgical    Comment: 1st intercourse- 15, partners- 5-, MARRIED- 40, hysterectomy  Other Topics Concern   Not on file  Social History Narrative   Not on file   Social Drivers of Health   Financial Resource Strain: Low Risk  (03/10/2023)   Overall Financial Resource Strain (CARDIA)    Difficulty of  Paying Living Expenses: Not hard at all  Food Insecurity: No Food Insecurity (03/10/2023)   Hunger Vital Sign    Worried About Running Out of Food in the Last Year: Never true    Ran Out of Food in the Last Year: Never true  Transportation Needs: No Transportation Needs (03/10/2023)   PRAPARE - Administrator, Civil Service (Medical): No    Lack of Transportation (Non-Medical): No  Physical Activity: Insufficiently Active (03/10/2023)   Exercise Vital Sign    Days of Exercise per Week: 3 days    Minutes of Exercise per Session: 30 min  Stress: No Stress Concern Present (03/10/2023)   [employer] of Occupational Health - Occupational Stress Questionnaire    Feeling of Stress : Not at all  Social Connections: Socially Integrated (03/10/2023)   Social Connection and Isolation Panel [NHANES]    Frequency of Communication with  Friends and Family: More than three times a week    Frequency of Social Gatherings with Friends and Family: Twice a week    Attends Religious Services: 1 to 4 times per year    Active Member of [employer] or Organizations: Yes    Attends Engineer, Structural: More than 4 times per year    Marital Status: Married   Family History  Problem Relation Age of Onset   Hearing loss Mother    Hyperlipidemia Mother    Hypertension Mother    Diabetes Mother    Heart attack Father    Heart disease Father    Hyperlipidemia Father    Hypertension Father    Melanoma Father    Asthma Sister    Hyperlipidemia Sister    Hypertension Sister    Hyperlipidemia Sister    Breast cancer Sister 16   Asthma Sister    Depression Sister    Heart attack Sister    Heart disease Sister    Hypertension Sister    Diabetes Sister    Breast cancer Maternal Aunt        over 67   Breast cancer Paternal Aunt        over 65   Breast cancer Paternal Grandmother        over 18    Breast cancer Maternal Aunt        over 50    Colon cancer Maternal Grandfather    Esophageal cancer Neg Hx    Inflammatory bowel disease Neg Hx    Liver disease Neg Hx    Pancreatic cancer Neg Hx    Rectal cancer Neg Hx    Stomach cancer Neg Hx    Allergies  Allergen Reactions   Azithromycin Rash   Chlorhexidine  Gluconate Rash   Jardiance  [Empagliflozin ] Other (See Comments)    Yeast vaginitis   Penicillins Hives and Rash   Current Outpatient Medications  Medication Sig Dispense Refill   albuterol  (PROVENTIL ) (2.5 MG/3ML) 0.083% nebulizer solution Take 2.5 mg by nebulization every 6 (six) hours as needed for shortness of breath or wheezing. PRN  7   albuterol  (VENTOLIN  HFA) 108 (90 Base) MCG/ACT inhaler Inhale 1-2 puffs into the lungs every 6 (six) hours as needed for wheezing or shortness of breath.     budesonide -formoterol  (SYMBICORT ) 160-4.5 MCG/ACT inhaler Inhale 1 puff into the lungs at bedtime as needed. 1  Inhaler 3   carvedilol  (COREG ) 6.25 MG tablet TAKE ONE TABLET BY MOUTH TWICE A DAY WITH A MEAL 180 tablet 0   cetirizine (ZYRTEC) 10 MG tablet  Take 10 mg by mouth daily.     glucose blood (ONETOUCH VERIO) test strip Check blood sugar before 3 meals and at bedtime. 100 each 12   Lancets (ONETOUCH DELICA PLUS LANCET33G) MISC 1 each by Does not apply route in the morning, at noon, in the evening, and at bedtime. 100 each 3   magnesium oxide (MAG-OX) 400 MG tablet Take 400 mg by mouth daily.     metFORMIN  (GLUCOPHAGE -XR) 750 MG 24 hr tablet TAKE ONE TABLET BY MOUTH TWICE A DAY AFTER A MEAL 180 tablet 1   Multiple Vitamin (MULTIVITAMIN) tablet Take 1 tablet by mouth daily.     olmesartan  (BENICAR ) 40 MG tablet TAKE 1 TABLET DAILY 90 tablet 3   pantoprazole  (PROTONIX ) 40 MG tablet Take 1 tablet (40 mg total) by mouth daily. Please call [phone] for further refills. 90 tablet 3   rosuvastatin  (CRESTOR ) 20 MG tablet TAKE 1 TABLET DAILY 90 tablet 3   tirzepatide  (MOUNJARO ) 10 MG/0.5ML Pen Inject 10 mg into the skin once a week. 6 mL 1   triamcinolone  (NASACORT ) 55 MCG/ACT AERO nasal inhaler Place 1 spray into the nose daily.     valACYclovir  (VALTREX ) 1000 MG tablet Take 1/2 tablet 2 times daily for 3 days as needed for outbreaks. 9 tablet 0   venlafaxine  XR (EFFEXOR -XR) 75 MG 24 hr capsule TAKE 1 CAPSULE DAILY WITH BREAKFAST 90 capsule 3   No current facility-administered medications for this visit.   No results found.  Review of Systems:   A ROS was performed including pertinent positives and negatives as documented in the HPI.  Physical Exam :   Constitutional: NAD and appears stated age Neurological: Alert and oriented Psych: Appropriate affect and cooperative There were no vitals taken for this visit.   Comprehensive Musculoskeletal Exam:    No pinpoint tenderness over the distal tip of the fibula or lateral ankle ligaments.  Mild tenderness over the distal fibula just proximal to the  malleolus.  Full ankle range of motion to 20 degrees dorsiflexion 40 degrees plantarflexion.  DP pulse 2+.  Imaging:   Xray (right ankle 3 views): Improved healing of distal fibula avulsion fracture without change in displacement.   I personally reviewed and interpreted the radiographs.   Assessment:   68 y.o. female approximately 10 weeks status post avulsion fracture to the right distal fibular tip.  X-rays taken today do show some improvement in fracture healing although fracture line is still clearly visible.  Given that she is symptomatically much better I have recommended continued use of ankle bracing as needed but can remain full weightbearing.  At this point I would like to continue to monitor symptomatically and have her return as needed if ankle worsens or mild discomfort persists.  Plan :    -Return to clinic as needed     I personally saw and evaluated the patient, and participated in the management and treatment plan.  Leonce Reveal, PA-C Orthopedics

## 2023-03-13 ENCOUNTER — Other Ambulatory Visit: Payer: Self-pay

## 2023-03-13 DIAGNOSIS — E119 Type 2 diabetes mellitus without complications: Secondary | ICD-10-CM

## 2023-03-13 MED ORDER — TIRZEPATIDE 10 MG/0.5ML ~~LOC~~ SOAJ: 10.0000 mg | SUBCUTANEOUS | 1 refills | Status: DC

## 2023-03-14 ENCOUNTER — Ambulatory Visit: Payer: BLUE CROSS/BLUE SHIELD

## 2023-04-01 ENCOUNTER — Telehealth: Payer: Self-pay

## 2023-04-01 NOTE — Telephone Encounter (Signed)
Left message for patient to return call to schedule follow up appointment. Will continue efforts.

## 2023-04-03 NOTE — Telephone Encounter (Signed)
Left message for patient to return call to schedule follow up appointment. Will continue efforts.

## 2023-04-11 NOTE — Telephone Encounter (Signed)
Phone call to the patient making her aware that she would need an office visit before we could refill her medications. Patient was offered a follow up appointment with Dr Barron Alvine at his next available appointment in May 2025, but she stated she is not sure if she will be in [place] during that time as she lives between two homes.  She stated she would get refills from her PCP.  Patient advised that she has not been seen in our office since 2023 and she will not be given further refills until she is evaluated in our office.  Patient agreed to plan and verbalized understanding.  No further questions.

## 2023-04-15 ENCOUNTER — Ambulatory Visit (INDEPENDENT_AMBULATORY_CARE_PROVIDER_SITE_OTHER): Payer: Medicare Other | Admitting: Family Medicine

## 2023-04-15 ENCOUNTER — Encounter: Payer: Self-pay | Admitting: Family Medicine

## 2023-04-15 VITALS — BP 120/70 | HR 69 | Temp 97.1°F | Ht 64.0 in | Wt 152.6 lb

## 2023-04-15 DIAGNOSIS — R1013 Epigastric pain: Secondary | ICD-10-CM | POA: Diagnosis not present

## 2023-04-15 DIAGNOSIS — R61 Generalized hyperhidrosis: Secondary | ICD-10-CM

## 2023-04-15 DIAGNOSIS — Z1231 Encounter for screening mammogram for malignant neoplasm of breast: Secondary | ICD-10-CM

## 2023-04-15 DIAGNOSIS — Z87891 Personal history of nicotine dependence: Secondary | ICD-10-CM | POA: Diagnosis not present

## 2023-04-15 LAB — CBC WITH DIFFERENTIAL/PLATELET
Basophils Absolute: 0 10*3/uL (ref 0.0–0.1)
Basophils Relative: 0.4 % (ref 0.0–3.0)
Eosinophils Absolute: 0.1 10*3/uL (ref 0.0–0.7)
Eosinophils Relative: 1.5 % (ref 0.0–5.0)
HCT: 41.4 % (ref 36.0–46.0)
Hemoglobin: 13.7 g/dL (ref 12.0–15.0)
Lymphocytes Relative: 45.9 % (ref 12.0–46.0)
Lymphs Abs: 2.6 10*3/uL (ref 0.7–4.0)
MCHC: 33 g/dL (ref 30.0–36.0)
MCV: 93.5 fL (ref 78.0–100.0)
Monocytes Absolute: 0.3 10*3/uL (ref 0.1–1.0)
Monocytes Relative: 6.1 % (ref 3.0–12.0)
Neutro Abs: 2.6 10*3/uL (ref 1.4–7.7)
Neutrophils Relative %: 46.1 % (ref 43.0–77.0)
Platelets: 260 10*3/uL (ref 150.0–400.0)
RBC: 4.43 Mil/uL (ref 3.87–5.11)
RDW: 13.1 % (ref 11.5–15.5)
WBC: 5.7 10*3/uL (ref 4.0–10.5)

## 2023-04-15 LAB — TSH: TSH: 1.74 u[IU]/mL (ref 0.35–5.50)

## 2023-04-15 LAB — COMPREHENSIVE METABOLIC PANEL
ALT: 23 U/L (ref 0–35)
AST: 18 U/L (ref 0–37)
Albumin: 4.3 g/dL (ref 3.5–5.2)
Alkaline Phosphatase: 52 U/L (ref 39–117)
BUN: 23 mg/dL (ref 6–23)
CO2: 29 meq/L (ref 19–32)
Calcium: 9.4 mg/dL (ref 8.4–10.5)
Chloride: 100 meq/L (ref 96–112)
Creatinine, Ser: 0.65 mg/dL (ref 0.40–1.20)
GFR: 89.65 mL/min (ref >60.00)
Glucose, Bld: 86 mg/dL (ref 70–99)
Potassium: 3.8 meq/L (ref 3.5–5.1)
Sodium: 138 meq/L (ref 135–145)
Total Bilirubin: 0.5 mg/dL (ref 0.2–1.2)
Total Protein: 6.6 g/dL (ref 6.0–8.3)

## 2023-04-15 LAB — C-REACTIVE PROTEIN: CRP: <1 mg/dL (ref 0.5–20.0)

## 2023-04-15 LAB — SEDIMENTATION RATE: Sed Rate: 7 mm/h (ref 0–30)

## 2023-04-15 LAB — AMYLASE: Amylase: 23 U/L — ABNORMAL LOW (ref 27–131)

## 2023-04-15 LAB — LIPASE: Lipase: 19 U/L (ref 11.0–59.0)

## 2023-04-15 NOTE — Progress Notes (Signed)
 Established Patient Office Visit   Subjective:  Patient ID: Alejandra Hess, female    DOB: 1955/07/28  Age: 68 y.o. MRN: [id number]  Chief Complaint  Patient presents with   Medication Problem    Pt complains of night sweats, back pain and drys eyes x 2 weeks. Pt believes it is the [facility] that is causing and and wants to see if Dr. Doreene Burke can lower the dose and send in a Rx for her eyes.     HPI Encounter Diagnoses  Name Primary?   History of tobacco use Yes   Night sweats    Epigastric pain    For follow-up of Mounjaro.  Weight has been stable with it.  A1c has been in the less than 7 range.  She has been having night sweats over the last few months.  Distant history of tobacco use.  She smoked a pack a week.  There has been no cough.  She has been experiencing some upper abdominal pain.  Ongoing mild nausea and diarrhea with Mounjaro   Review of Systems  Constitutional: Negative.   HENT: Negative.    Eyes:  Negative for blurred vision, discharge and redness.  Respiratory: Negative.    Cardiovascular: Negative.   Gastrointestinal:  Positive for abdominal pain, diarrhea and nausea. Negative for vomiting.  Genitourinary: Negative.   Musculoskeletal: Negative.  Negative for myalgias.  Skin:  Negative for rash.  Neurological:  Negative for tingling, loss of consciousness and weakness.  Endo/Heme/Allergies:  Negative for polydipsia.     Current Outpatient Medications:    albuterol (PROVENTIL) (2.5 MG/3ML) 0.083% nebulizer solution, Take 2.5 mg by nebulization every 6 (six) hours as needed for shortness of breath or wheezing. PRN, Disp: , Rfl: 7   albuterol (VENTOLIN HFA) 108 (90 Base) MCG/ACT inhaler, Inhale 1-2 puffs into the lungs every 6 (six) hours as needed for wheezing or shortness of breath., Disp: , Rfl:    budesonide-formoterol (SYMBICORT) 160-4.5 MCG/ACT inhaler, Inhale 1 puff into the lungs at bedtime as needed., Disp: 1 Inhaler, Rfl: 3   carvedilol (COREG) 6.25 MG  tablet, TAKE ONE TABLET BY MOUTH TWICE A DAY WITH A MEAL, Disp: 180 tablet, Rfl: 0   cetirizine (ZYRTEC) 10 MG tablet, Take 10 mg by mouth daily., Disp: , Rfl:    glucose blood (ONETOUCH VERIO) test strip, Check blood sugar before 3 meals and at bedtime., Disp: 100 each, Rfl: 12   Lancets (ONETOUCH DELICA PLUS LANCET33G) MISC, 1 each by Does not apply route in the morning, at noon, in the evening, and at bedtime., Disp: 100 each, Rfl: 3   magnesium oxide (MAG-OX) 400 MG tablet, Take 400 mg by mouth daily., Disp: , Rfl:    metFORMIN (GLUCOPHAGE-XR) 750 MG 24 hr tablet, TAKE ONE TABLET BY MOUTH TWICE A DAY AFTER A MEAL, Disp: 180 tablet, Rfl: 1   Multiple Vitamin (MULTIVITAMIN) tablet, Take 1 tablet by mouth daily., Disp: , Rfl:    olmesartan (BENICAR) 40 MG tablet, Take 1 tablet (40 mg total) by mouth daily., Disp: 90 tablet, Rfl: 3   pantoprazole (PROTONIX) 40 MG tablet, Take 1 tablet (40 mg total) by mouth daily. Please call [phone] for further refills., Disp: 90 tablet, Rfl: 3   rosuvastatin (CRESTOR) 20 MG tablet, Take 1 tablet (20 mg total) by mouth daily., Disp: 90 tablet, Rfl: 3   tirzepatide (MOUNJARO) 10 MG/0.5ML Pen, Inject 10 mg into the skin once a week., Disp: 6 mL, Rfl: 1   triamcinolone (NASACORT) 55  MCG/ACT AERO nasal inhaler, Place 1 spray into the nose daily., Disp: , Rfl:    valACYclovir (VALTREX) 1000 MG tablet, Take 1/2 tablet 2 times daily for 3 days as needed for outbreaks., Disp: 9 tablet, Rfl: 0   venlafaxine XR (EFFEXOR-XR) 75 MG 24 hr capsule, TAKE 1 CAPSULE DAILY WITH BREAKFAST, Disp: 90 capsule, Rfl: 3   Objective:     BP 120/70   Pulse 69   Temp (!) 97.1 F (36.2 C)   Ht 5\' 4"  (1.626 m)   Wt 152 lb 9.6 oz (69.2 kg)   SpO2 98%   BMI 26.19 kg/m  Wt Readings from Last 3 Encounters:  04/15/23 152 lb 9.6 oz (69.2 kg)  03/11/23 154 lb 12.8 oz (70.2 kg)  08/24/22 155 lb 3.2 oz (70.4 kg)      Physical Exam Constitutional:      General: She is not in acute  distress.    Appearance: Normal appearance. She is not ill-appearing, toxic-appearing or diaphoretic.  HENT:     Head: Normocephalic and atraumatic.     Right Ear: External ear normal.     Left Ear: External ear normal.     Mouth/Throat:     Mouth: Mucous membranes are moist.     Pharynx: Oropharynx is clear. No oropharyngeal exudate or posterior oropharyngeal erythema.  Eyes:     General: No scleral icterus.       Right eye: No discharge.        Left eye: No discharge.     Extraocular Movements: Extraocular movements intact.     Conjunctiva/sclera: Conjunctivae normal.     Pupils: Pupils are equal, round, and reactive to light.  Cardiovascular:     Rate and Rhythm: Normal rate and regular rhythm.  Pulmonary:     Effort: Pulmonary effort is normal. No respiratory distress.     Breath sounds: Normal breath sounds.  Abdominal:     General: Bowel sounds are normal.     Tenderness: There is abdominal tenderness (mild epigastric). There is no guarding or rebound.  Musculoskeletal:     Cervical back: No rigidity or tenderness.  Skin:    General: Skin is warm and dry.  Neurological:     Mental Status: She is alert and oriented to person, place, and time.  Psychiatric:        Mood and Affect: Mood normal.        Behavior: Behavior normal.      No results found for any visits on 04/15/23.    The ASCVD Risk score (Arnett DK, et al., 2019) failed to calculate for the following reasons:   The valid total cholesterol range is 130 to 320 mg/dL    Assessment & Plan:   History of tobacco use  Night sweats -     Sedimentation rate -     TSH -     C-reactive protein  Epigastric pain -     CBC with Differential/Platelet -     Comprehensive metabolic panel -     Amylase -     Lipase -     CT ABDOMEN PELVIS W CONTRAST; Future    Return in about 3 months (around 07/13/2023), or Continue Mounjaro at current dose.  Continue Mounjaro at current dose.  CT of abdomen and pelvis.   Could consider CT screening of chest.  Stated tobacco exposure 1 pack weekly for 10 to 20 years.  Mliss Sax, MD

## 2023-04-16 ENCOUNTER — Ambulatory Visit: Payer: BLUE CROSS/BLUE SHIELD

## 2023-04-24 ENCOUNTER — Other Ambulatory Visit: Payer: Medicare Other

## 2023-04-25 ENCOUNTER — Ambulatory Visit
Admission: RE | Admit: 2023-04-25 | Discharge: 2023-04-25 | Disposition: A | Discharge status: Home / Self Care | Payer: Medicare Other | Source: Ambulatory Visit | Attending: Family Medicine | Admitting: Family Medicine

## 2023-04-25 DIAGNOSIS — Z1231 Encounter for screening mammogram for malignant neoplasm of breast: Secondary | ICD-10-CM

## 2023-04-25 DIAGNOSIS — R1013 Epigastric pain: Secondary | ICD-10-CM

## 2023-04-25 MED ORDER — IOPAMIDOL (ISOVUE-370) INJECTION 76%
80.0000 mL | Freq: Once | INTRAVENOUS | Status: AC | PRN
  Administered 2023-04-25: 80 mL via INTRAVENOUS

## 2023-04-26 ENCOUNTER — Encounter: Payer: Self-pay | Admitting: Family Medicine

## 2023-04-26 ENCOUNTER — Other Ambulatory Visit: Payer: Self-pay | Admitting: Family Medicine

## 2023-04-26 DIAGNOSIS — I10 Essential (primary) hypertension: Secondary | ICD-10-CM

## 2023-05-07 ENCOUNTER — Encounter: Payer: Self-pay | Admitting: Family Medicine

## 2023-05-07 ENCOUNTER — Encounter: Payer: Self-pay | Admitting: Gastroenterology

## 2023-05-07 ENCOUNTER — Ambulatory Visit (INDEPENDENT_AMBULATORY_CARE_PROVIDER_SITE_OTHER): Admitting: Family Medicine

## 2023-05-07 VITALS — BP 144/82 | HR 68 | Temp 97.2°F | Ht 64.0 in | Wt 150.0 lb

## 2023-05-07 DIAGNOSIS — R61 Generalized hyperhidrosis: Secondary | ICD-10-CM | POA: Diagnosis not present

## 2023-05-07 DIAGNOSIS — E119 Type 2 diabetes mellitus without complications: Secondary | ICD-10-CM | POA: Diagnosis not present

## 2023-05-07 DIAGNOSIS — Z7984 Long term (current) use of oral hypoglycemic drugs: Secondary | ICD-10-CM

## 2023-05-07 DIAGNOSIS — R519 Headache, unspecified: Secondary | ICD-10-CM | POA: Insufficient documentation

## 2023-05-07 NOTE — Progress Notes (Signed)
 Established Patient Office Visit   Subjective:  Patient ID: Alejandra Hess, female    DOB: [birth year]  Age: 68 y.o. MRN: [id number]  Chief Complaint  Patient presents with   Night Sweats    Pt complains of night sweats, headaches and blurred vision.     HPI Encounter Diagnoses  Name Primary?   Night sweats Yes   New onset of headaches    Type 2 diabetes mellitus without complication, without long-term current use of insulin (HCC)    Ongoing workup for night sweats.  Night sweats have been present over the last 2 months.  She soaks her nightgown.  No appreciable weight loss.  She is awoken by headache that is behind her eyes.  She feels as though she is lost peripheral vision.  Appointment with ophthalmology is pending.  Ongoing allergy history.  She is on steroid nasal sprays.  Inflammatory markers were negative.  She requests referral to endocrinology.  Diabetes is well-controlled on current therapy.   Review of Systems  Constitutional:  Positive for diaphoresis.  HENT: Negative.    Eyes:  Positive for blurred vision. Negative for discharge and redness.  Respiratory: Negative.    Cardiovascular: Negative.   Gastrointestinal:  Negative for abdominal pain.  Genitourinary: Negative.   Musculoskeletal: Negative.  Negative for myalgias.  Skin:  Negative for rash.  Neurological:  Negative for tingling, loss of consciousness, weakness and headaches.  Endo/Heme/Allergies:  Negative for polydipsia.     Current Outpatient Medications:    albuterol (PROVENTIL) (2.5 MG/3ML) 0.083% nebulizer solution, Take 2.5 mg by nebulization every 6 (six) hours as needed for shortness of breath or wheezing. PRN, Disp: , Rfl: 7   albuterol (VENTOLIN HFA) 108 (90 Base) MCG/ACT inhaler, Inhale 1-2 puffs into the lungs every 6 (six) hours as needed for wheezing or shortness of breath., Disp: , Rfl:    budesonide-formoterol (SYMBICORT) 160-4.5 MCG/ACT inhaler, Inhale 1 puff into the lungs at bedtime as  needed., Disp: 1 Inhaler, Rfl: 3   carvedilol (COREG) 6.25 MG tablet, TAKE ONE TABLET BY MOUTH TWICE A DAY WITH A MEAL, Disp: 180 tablet, Rfl: 0   cetirizine (ZYRTEC) 10 MG tablet, Take 10 mg by mouth daily., Disp: , Rfl:    glucose blood (ONETOUCH VERIO) test strip, Check blood sugar before 3 meals and at bedtime., Disp: 100 each, Rfl: 12   Lancets (ONETOUCH DELICA PLUS LANCET33G) MISC, 1 each by Does not apply route in the morning, at noon, in the evening, and at bedtime., Disp: 100 each, Rfl: 3   magnesium oxide (MAG-OX) 400 MG tablet, Take 400 mg by mouth daily., Disp: , Rfl:    metFORMIN (GLUCOPHAGE-XR) 750 MG 24 hr tablet, TAKE ONE TABLET BY MOUTH TWICE A DAY AFTER A MEAL, Disp: 180 tablet, Rfl: 1   Multiple Vitamin (MULTIVITAMIN) tablet, Take 1 tablet by mouth daily., Disp: , Rfl:    olmesartan (BENICAR) 40 MG tablet, Take 1 tablet (40 mg total) by mouth daily., Disp: 90 tablet, Rfl: 3   pantoprazole (PROTONIX) 40 MG tablet, Take 1 tablet (40 mg total) by mouth daily. Please call [phone] for further refills., Disp: 90 tablet, Rfl: 3   rosuvastatin (CRESTOR) 20 MG tablet, Take 1 tablet (20 mg total) by mouth daily., Disp: 90 tablet, Rfl: 3   tirzepatide (MOUNJARO) 10 MG/0.5ML Pen, Inject 10 mg into the skin once a week., Disp: 6 mL, Rfl: 1   triamcinolone (NASACORT) 55 MCG/ACT AERO nasal inhaler, Place 1 spray into the nose  daily., Disp: , Rfl:    valACYclovir (VALTREX) 1000 MG tablet, Take 1/2 tablet 2 times daily for 3 days as needed for outbreaks., Disp: 9 tablet, Rfl: 0   venlafaxine XR (EFFEXOR-XR) 75 MG 24 hr capsule, TAKE 1 CAPSULE DAILY WITH BREAKFAST, Disp: 90 capsule, Rfl: 3   Objective:     BP (!) 144/82   Pulse 68   Temp (!) 97.2 F (36.2 C)   Ht 5\' 4"  (1.626 m)   Wt 150 lb (68 kg)   SpO2 97%   BMI 25.75 kg/m  BP Readings from Last 3 Encounters:  05/07/23 (!) 144/82  04/15/23 120/70  03/11/23 126/72   Wt Readings from Last 3 Encounters:  05/07/23 150 lb (68  kg)  04/15/23 152 lb 9.6 oz (69.2 kg)  03/11/23 154 lb 12.8 oz (70.2 kg)      Physical Exam Constitutional:      General: She is not in acute distress.    Appearance: Normal appearance. She is not ill-appearing, toxic-appearing or diaphoretic.  HENT:     Head: Normocephalic and atraumatic.     Right Ear: External ear normal.     Left Ear: External ear normal.  Eyes:     General: No scleral icterus.       Right eye: No discharge.        Left eye: No discharge.     Extraocular Movements: Extraocular movements intact.     Conjunctiva/sclera: Conjunctivae normal.  Pulmonary:     Effort: Pulmonary effort is normal. No respiratory distress.  Skin:    General: Skin is warm and dry.  Neurological:     Mental Status: She is alert and oriented to person, place, and time.  Psychiatric:        Mood and Affect: Mood normal.        Behavior: Behavior normal.      No results found for any visits on 05/07/23.    The ASCVD Risk score (Arnett DK, et al., 2019) failed to calculate for the following reasons:   The valid total cholesterol range is 130 to 320 mg/dL    Assessment & Plan:   Night sweats -     CT CHEST WO CONTRAST; Future -     MR BRAIN W WO CONTRAST; Future  New onset of headaches -     MR BRAIN W WO CONTRAST; Future  Type 2 diabetes mellitus without complication, without long-term current use of insulin (HCC) -     Ambulatory referral to Endocrinology    Return in about 8 weeks (around 07/02/2023).  ABN obtained for CT of chest.  Needed exam for workup of night sweats.  She smoked for a brief period of time and then quit.  Does not qualify for screening CT of chest.  MRI for headache and loss of peripheral vision.  Requested referral to endocrinology.  Mliss Sax, MD

## 2023-05-08 MED ORDER — VENLAFAXINE HCL ER 75 MG PO CP24: ORAL_CAPSULE | ORAL | 0 refills | Status: DC

## 2023-05-14 ENCOUNTER — Telehealth (HOSPITAL_BASED_OUTPATIENT_CLINIC_OR_DEPARTMENT_OTHER): Payer: Self-pay

## 2023-05-22 ENCOUNTER — Encounter: Payer: Self-pay | Admitting: Family Medicine

## 2023-05-23 ENCOUNTER — Other Ambulatory Visit: Payer: Self-pay

## 2023-05-23 DIAGNOSIS — E119 Type 2 diabetes mellitus without complications: Secondary | ICD-10-CM

## 2023-05-23 MED ORDER — TIRZEPATIDE 10 MG/0.5ML ~~LOC~~ SOAJ: 10.0000 mg | SUBCUTANEOUS | 0 refills | Status: DC

## 2023-05-25 ENCOUNTER — Other Ambulatory Visit: Payer: Self-pay | Admitting: Family Medicine

## 2023-05-25 DIAGNOSIS — E119 Type 2 diabetes mellitus without complications: Secondary | ICD-10-CM

## 2023-05-27 ENCOUNTER — Other Ambulatory Visit: Payer: Self-pay | Admitting: Family Medicine

## 2023-05-27 DIAGNOSIS — E119 Type 2 diabetes mellitus without complications: Secondary | ICD-10-CM

## 2023-05-28 ENCOUNTER — Other Ambulatory Visit: Payer: Self-pay

## 2023-05-28 ENCOUNTER — Other Ambulatory Visit: Payer: Self-pay | Admitting: Family Medicine

## 2023-05-28 DIAGNOSIS — E119 Type 2 diabetes mellitus without complications: Secondary | ICD-10-CM

## 2023-05-28 MED ORDER — TIRZEPATIDE 10 MG/0.5ML ~~LOC~~ SOAJ: 10.0000 mg | SUBCUTANEOUS | 0 refills | Status: DC

## 2023-06-03 DIAGNOSIS — I10 Essential (primary) hypertension: Secondary | ICD-10-CM | POA: Diagnosis not present

## 2023-06-03 DIAGNOSIS — E1169 Type 2 diabetes mellitus with other specified complication: Secondary | ICD-10-CM | POA: Diagnosis not present

## 2023-06-03 DIAGNOSIS — E119 Type 2 diabetes mellitus without complications: Secondary | ICD-10-CM | POA: Diagnosis not present

## 2023-06-03 DIAGNOSIS — R61 Generalized hyperhidrosis: Secondary | ICD-10-CM | POA: Diagnosis not present

## 2023-06-03 DIAGNOSIS — K219 Gastro-esophageal reflux disease without esophagitis: Secondary | ICD-10-CM | POA: Diagnosis not present

## 2023-06-17 ENCOUNTER — Telehealth: Admitting: Physician Assistant

## 2023-06-17 DIAGNOSIS — J208 Acute bronchitis due to other specified organisms: Secondary | ICD-10-CM

## 2023-06-17 DIAGNOSIS — B9689 Other specified bacterial agents as the cause of diseases classified elsewhere: Secondary | ICD-10-CM | POA: Diagnosis not present

## 2023-06-17 MED ORDER — BENZONATATE 100 MG PO CAPS: 100.0000 mg | ORAL_CAPSULE | Freq: Three times a day (TID) | ORAL | 0 refills | Status: DC | PRN

## 2023-06-17 MED ORDER — PREDNISONE 20 MG PO TABS: 40.0000 mg | ORAL_TABLET | Freq: Every day | ORAL | 0 refills | Status: DC

## 2023-06-17 MED ORDER — ALBUTEROL SULFATE (2.5 MG/3ML) 0.083% IN NEBU: 2.5000 mg | INHALATION_SOLUTION | Freq: Four times a day (QID) | RESPIRATORY_TRACT | 0 refills | Status: DC | PRN

## 2023-06-17 MED ORDER — LEVOFLOXACIN 500 MG PO TABS: 500.0000 mg | ORAL_TABLET | Freq: Every day | ORAL | 0 refills | Status: AC

## 2023-06-17 MED ORDER — PROMETHAZINE-DM 6.25-15 MG/5ML PO SYRP: 5.0000 mL | ORAL_SOLUTION | Freq: Four times a day (QID) | ORAL | 0 refills | Status: DC | PRN

## 2023-06-17 NOTE — Progress Notes (Signed)
 Virtual Visit Consent   Alejandra Hess, you are scheduled for a virtual visit with a [facility] provider today. Just as with appointments in the office, your consent must be obtained to participate. Your consent will be active for this visit and any virtual visit you may have with one of our providers in the next 365 days. If you have a MyChart account, a copy of this consent can be sent to you electronically.  As this is a virtual visit, video technology does not allow for your provider to perform a traditional examination. This may limit your provider's ability to fully assess your condition. If your provider identifies any concerns that need to be evaluated in person or the need to arrange testing (such as labs, EKG, etc.), we will make arrangements to do so. Although advances in technology are sophisticated, we cannot ensure that it will always work on either your end or our end. If the connection with a video visit is poor, the visit may have to be switched to a telephone visit. With either a video or telephone visit, we are not always able to ensure that we have a secure connection.  By engaging in this virtual visit, you consent to the provision of healthcare and authorize for your insurance to be billed (if applicable) for the services provided during this visit. Depending on your insurance coverage, you may receive a charge related to this service.  I need to obtain your verbal consent now. Are you willing to proceed with your visit today? Alejandra Hess has provided verbal consent on 06/17/2023 for a virtual visit (video or telephone). Alejandra Kelp, PA-C  Date: 06/17/2023 8:13 AM   Virtual Visit via Video Note   I, Alejandra Hess, connected with  Alejandra Hess  ([id number], [birth year]) on 06/17/23 at  8:00 AM EDT by a video-enabled telemedicine application and verified that I am speaking with the correct person using two identifiers.  Location: Patient: Virtual Visit  Location Patient: Home Provider: Virtual Visit Location Provider: Home Office   I discussed the limitations of evaluation and management by telemedicine and the availability of in person appointments. The patient expressed understanding and agreed to proceed.    History of Present Illness: Alejandra Hess is a 68 y.o. who identifies as a female who was assigned female at birth, and is being seen today for cough and congestion.  HPI: URI  This is a new problem. The current episode started yesterday. The problem has been gradually worsening. Maximum temperature: reports low grade. The fever has been present for Less than 1 day. Associated symptoms include chest pain (heaviness and tight), congestion, coughing, headaches, a plugged ear sensation, rhinorrhea (post nasal drainage), sinus pain (frontal), a sore throat (possibly from coughing) and wheezing. Pertinent negatives include no ear pain. Associated symptoms comments: Hoarse voice. Treatments tried: nebulizer treatments, dayquil. The treatment provided no relief.    Patient states it is not Covid, but has not tested. States every time she has had Covid it starts with a sore throat and this time it started with a cough, which is more consistent with her bacterial bronchitis and asthma exacerbation issues. Does not have body aches or high fevers like flu.  Had been on vacation around 5,000 people so unsure of sick contacts.  Is vaccinated against Flu and UTD on Covid vaccines.  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   Overweight (BMI 25.0-29.9) 02/09/2022   Abnormal EKG 01/17/2022   Need for shingles vaccine 01/17/2022   Cough 01/02/2022   Medication side effect 09/26/2021   Uncontrolled type 2 diabetes mellitus with hyperglycemia (HCC) 05/25/2021   Dysfunction of left  eustachian tube 05/25/2021   Ceruminosis, bilateral 10/27/2020   Essential hypertension 10/27/2020   Chronic eczematous otitis externa of both ears 10/27/2020   Acute non-recurrent maxillary sinusitis 09/22/2020   Adrenal nodule (HCC) 09/20/2020   Chronic cough    Heartburn    Asthma 10/08/2019   Hiatal hernia 08/16/2019   Schatzki's ring 08/16/2019   Gastritis 08/16/2019   History of colonic polyps 08/16/2019   Vasculitis (HCC) 05/04/2019   History of COVID-19 04/03/2019   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/03/2019   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   Other fatigue 01/20/2019   Hot flashes 01/20/2019   COVID-19 virus infection 12/12/2018   Viral syndrome 12/08/2018   Reactive airway disease 12/08/2018   Glaucoma 11/24/2018   Gastroesophageal reflux disease 11/21/2018   H/O esophagitis 11/21/2018   Elevated LDL cholesterol level 07/04/2018   Type 2 diabetes mellitus without complication, without long-term current use of insulin (HCC) 07/04/2018   Healthcare maintenance 01/27/2018   Asthmatic bronchitis 08/19/2017    Allergies:  Allergies  Allergen Reactions   Azithromycin Rash   Chlorhexidine  Gluconate Rash   Jardiance  [Empagliflozin ] Other (See Comments)    Yeast vaginitis   Penicillins Hives and Rash   Medications:  Current Outpatient Medications:    benzonatate  (TESSALON ) 100 MG capsule, Take 1-2 capsules (100-200 mg total) by mouth 3 (three) times daily as needed., Disp: 30 capsule, Rfl: 0   levofloxacin  (LEVAQUIN ) 500 MG tablet, Take 1 tablet (500 mg total) by mouth daily for 7 days., Disp: 7 tablet, Rfl: 0   predniSONE  (DELTASONE ) 20 MG tablet, Take 2 tablets (40 mg total) by mouth daily with breakfast., Disp: 10 tablet, Rfl: 0   promethazine -dextromethorphan (PROMETHAZINE -DM) 6.25-15 MG/5ML syrup, Take 5 mLs by mouth 4 (four) times daily as needed., Disp: 118 mL, Rfl: 0   albuterol  (PROVENTIL ) (2.5 MG/3ML) 0.083% nebulizer solution, Take 3 mLs (2.5 mg total) by nebulization every 6 (six)  hours as needed for shortness of breath or wheezing. PRN, Disp: 75 mL, Rfl: 0   albuterol  (VENTOLIN  HFA) 108 (90 Base) MCG/ACT inhaler, Inhale 1-2 puffs into the lungs every 6 (six) hours as needed for wheezing or shortness of breath., Disp: , Rfl:    budesonide -formoterol  (SYMBICORT ) 160-4.5 MCG/ACT inhaler, Inhale 1 puff into the lungs at bedtime as needed., Disp: 1 Inhaler, Rfl: 3   carvedilol  (COREG ) 6.25 MG tablet, TAKE ONE TABLET BY MOUTH TWICE A DAY WITH A MEAL, Disp: 180 tablet, Rfl: 0   cetirizine (ZYRTEC) 10 MG tablet, Take 10 mg by mouth daily., Disp: , Rfl:    glucose blood (ONETOUCH VERIO) test strip, Check blood sugar before 3 meals and at bedtime., Disp: 100 each, Rfl: 12   Lancets (ONETOUCH DELICA PLUS LANCET33G) MISC, 1 each by Does not apply route in the morning, at noon, in the evening, and at bedtime., Disp: 100 each, Rfl: 3   magnesium oxide (MAG-OX) 400 MG tablet, Take 400 mg by mouth daily., Disp: , Rfl:    metFORMIN  (GLUCOPHAGE -XR) 750 MG 24 hr tablet, TAKE ONE TABLET BY MOUTH TWICE A DAY AFTER A MEAL, Disp: 180 tablet, Rfl: 1   Multiple Vitamin (MULTIVITAMIN) tablet, Take 1 tablet by mouth daily., Disp: ,  Rfl:    olmesartan  (BENICAR ) 40 MG tablet, Take 1 tablet (40 mg total) by mouth daily., Disp: 90 tablet, Rfl: 3   pantoprazole  (PROTONIX ) 40 MG tablet, Take 1 tablet (40 mg total) by mouth daily. Please call [phone] for further refills., Disp: 90 tablet, Rfl: 3   rosuvastatin  (CRESTOR ) 20 MG tablet, Take 1 tablet (20 mg total) by mouth daily., Disp: 90 tablet, Rfl: 3   tirzepatide  (MOUNJARO ) 10 MG/0.5ML Pen, Inject 10 mg into the skin once a week., Disp: 6 mL, Rfl: 0   triamcinolone  (NASACORT ) 55 MCG/ACT AERO nasal inhaler, Place 1 spray into the nose daily., Disp: , Rfl:    valACYclovir  (VALTREX ) 1000 MG tablet, Take 1/2 tablet 2 times daily for 3 days as needed for outbreaks., Disp: 9 tablet, Rfl: 0   venlafaxine  XR (EFFEXOR -XR) 75 MG 24 hr capsule, TAKE 1 CAPSULE  DAILY WITH BREAKFAST, Disp: 90 capsule, Rfl: 0  Observations/Objective: Patient is well-developed, well-nourished in no acute distress.  Resting comfortably at home.  Head is normocephalic, atraumatic.  No labored breathing.  Speech is clear and coherent with logical content.  Patient is alert and oriented at baseline.    Assessment and Plan: 1. Acute bacterial bronchitis (Primary) - levofloxacin  (LEVAQUIN ) 500 MG tablet; Take 1 tablet (500 mg total) by mouth daily for 7 days.  Dispense: 7 tablet; Refill: 0 - promethazine -dextromethorphan (PROMETHAZINE -DM) 6.25-15 MG/5ML syrup; Take 5 mLs by mouth 4 (four) times daily as needed.  Dispense: 118 mL; Refill: 0 - benzonatate  (TESSALON ) 100 MG capsule; Take 1-2 capsules (100-200 mg total) by mouth 3 (three) times daily as needed.  Dispense: 30 capsule; Refill: 0 - albuterol  (PROVENTIL ) (2.5 MG/3ML) 0.083% nebulizer solution; Take 3 mLs (2.5 mg total) by nebulization every 6 (six) hours as needed for shortness of breath or wheezing. PRN  Dispense: 75 mL; Refill: 0 - predniSONE  (DELTASONE ) 20 MG tablet; Take 2 tablets (40 mg total) by mouth daily with breakfast.  Dispense: 10 tablet; Refill: 0  - Worsening over a week despite OTC medications - Will treat with Levaquin , Prednisone , Promethazine  DM, and tessalon  perles - Albuterol  nebulizer solution refilled - Can continue Mucinex  (PLAIN) if needed - Tylenol  and Ibuprofen if needed - Push fluids.  - Rest.  - Steam and humidifier can help - Seek in person evaluation if worsening or symptoms fail to improve    Follow Up Instructions: I discussed the assessment and treatment plan with the patient. The patient was provided an opportunity to ask questions and all were answered. The patient agreed with the plan and demonstrated an understanding of the instructions.  A copy of instructions were sent to the patient via MyChart unless otherwise noted below.    The patient was advised to call back or  seek an in-person evaluation if the symptoms worsen or if the condition fails to improve as anticipated.    Alejandra Kelp, PA-C

## 2023-06-17 NOTE — Patient Instructions (Signed)
 Alejandra Hess, thank you for joining [name], PA-C for today's virtual visit.  While this provider is not your primary care provider (PCP), if your PCP is located in our provider database this encounter information will be shared with them immediately following your visit.   A [facility] MyChart account gives you access to today's visit and all your visits, tests, and labs performed at [facility] " click here if you don't have a [facility] MyChart account or go to [email or web address]  Consent: (Patient) Alejandra Hess provided verbal consent for this virtual visit at the beginning of the encounter.  Current Medications:  Current Outpatient Medications:    benzonatate  (TESSALON ) 100 MG capsule, Take 1-2 capsules (100-200 mg total) by mouth 3 (three) times daily as needed., Disp: 30 capsule, Rfl: 0   levofloxacin  (LEVAQUIN ) 500 MG tablet, Take 1 tablet (500 mg total) by mouth daily for 7 days., Disp: 7 tablet, Rfl: 0   predniSONE  (DELTASONE ) 20 MG tablet, Take 2 tablets (40 mg total) by mouth daily with breakfast., Disp: 10 tablet, Rfl: 0   promethazine -dextromethorphan (PROMETHAZINE -DM) 6.25-15 MG/5ML syrup, Take 5 mLs by mouth 4 (four) times daily as needed., Disp: 118 mL, Rfl: 0   albuterol  (PROVENTIL ) (2.5 MG/3ML) 0.083% nebulizer solution, Take 3 mLs (2.5 mg total) by nebulization every 6 (six) hours as needed for shortness of breath or wheezing. PRN, Disp: 75 mL, Rfl: 0   albuterol  (VENTOLIN  HFA) 108 (90 Base) MCG/ACT inhaler, Inhale 1-2 puffs into the lungs every 6 (six) hours as needed for wheezing or shortness of breath., Disp: , Rfl:    budesonide -formoterol  (SYMBICORT ) 160-4.5 MCG/ACT inhaler, Inhale 1 puff into the lungs at bedtime as needed., Disp: 1 Inhaler, Rfl: 3   carvedilol  (COREG ) 6.25 MG tablet, TAKE ONE TABLET BY MOUTH TWICE A DAY WITH A MEAL, Disp: 180 tablet, Rfl: 0   cetirizine (ZYRTEC) 10 MG tablet, Take 10 mg by mouth daily., Disp: ,  Rfl:    glucose blood (ONETOUCH VERIO) test strip, Check blood sugar before 3 meals and at bedtime., Disp: 100 each, Rfl: 12   Lancets (ONETOUCH DELICA PLUS LANCET33G) MISC, 1 each by Does not apply route in the morning, at noon, in the evening, and at bedtime., Disp: 100 each, Rfl: 3   magnesium oxide (MAG-OX) 400 MG tablet, Take 400 mg by mouth daily., Disp: , Rfl:    metFORMIN  (GLUCOPHAGE -XR) 750 MG 24 hr tablet, TAKE ONE TABLET BY MOUTH TWICE A DAY AFTER A MEAL, Disp: 180 tablet, Rfl: 1   Multiple Vitamin (MULTIVITAMIN) tablet, Take 1 tablet by mouth daily., Disp: , Rfl:    olmesartan  (BENICAR ) 40 MG tablet, Take 1 tablet (40 mg total) by mouth daily., Disp: 90 tablet, Rfl: 3   pantoprazole  (PROTONIX ) 40 MG tablet, Take 1 tablet (40 mg total) by mouth daily. Please call [phone] for further refills., Disp: 90 tablet, Rfl: 3   rosuvastatin  (CRESTOR ) 20 MG tablet, Take 1 tablet (20 mg total) by mouth daily., Disp: 90 tablet, Rfl: 3   tirzepatide  (MOUNJARO ) 10 MG/0.5ML Pen, Inject 10 mg into the skin once a week., Disp: 6 mL, Rfl: 0   triamcinolone  (NASACORT ) 55 MCG/ACT AERO nasal inhaler, Place 1 spray into the nose daily., Disp: , Rfl:    valACYclovir  (VALTREX ) 1000 MG tablet, Take 1/2 tablet 2 times daily for 3 days as needed for outbreaks., Disp: 9 tablet, Rfl: 0   venlafaxine  XR (EFFEXOR -XR) 75 MG 24 hr capsule, TAKE 1 CAPSULE DAILY WITH  BREAKFAST, Disp: 90 capsule, Rfl: 0   Medications ordered in this encounter:  Meds ordered this encounter  Medications   levofloxacin  (LEVAQUIN ) 500 MG tablet    Sig: Take 1 tablet (500 mg total) by mouth daily for 7 days.    Dispense:  7 tablet    Refill:  0    Supervising Provider:   LAMPTEY, PHILIP O [[id number]]   promethazine -dextromethorphan (PROMETHAZINE -DM) 6.25-15 MG/5ML syrup    Sig: Take 5 mLs by mouth 4 (four) times daily as needed.    Dispense:  118 mL    Refill:  0    Supervising Provider:   Corine Dice [id number]   benzonatate   (TESSALON ) 100 MG capsule    Sig: Take 1-2 capsules (100-200 mg total) by mouth 3 (three) times daily as needed.    Dispense:  30 capsule    Refill:  0    Supervising Provider:   Corine Dice [[id number]]   albuterol  (PROVENTIL ) (2.5 MG/3ML) 0.083% nebulizer solution    Sig: Take 3 mLs (2.5 mg total) by nebulization every 6 (six) hours as needed for shortness of breath or wheezing. PRN    Dispense:  75 mL    Refill:  0    Supervising Provider:   LAMPTEY, PHILIP O [[id number]]   predniSONE  (DELTASONE ) 20 MG tablet    Sig: Take 2 tablets (40 mg total) by mouth daily with breakfast.    Dispense:  10 tablet    Refill:  0    Supervising Provider:   Corine Dice [[id number]]     *If you need refills on other medications prior to your next appointment, please contact your pharmacy*  Follow-Up: Call back or seek an in-person evaluation if the symptoms worsen or if the condition fails to improve as anticipated.  [facility] [phone]  Other Instructions Acute Bronchitis, Adult  Acute bronchitis is sudden inflammation of the main airways (bronchi) that come off the windpipe (trachea) in the lungs. The swelling causes the airways to get smaller and make more mucus than normal. This can make it hard to breathe and can cause coughing or noisy breathing (wheezing). Acute bronchitis may last several weeks. The cough may last longer. Allergies, asthma, and exposure to smoke may make the condition worse. What are the causes? This condition can be caused by germs and by substances that irritate the lungs, including: Cold and flu viruses. The most common cause of this condition is the virus that causes the common cold. Bacteria. This is less common. Breathing in substances that irritate the lungs, including: Smoke from cigarettes and other forms of tobacco. Dust and pollen. Fumes from household cleaning products, gases, or burned fuel. Indoor or outdoor air pollution. What  increases the risk? The following factors may make you more likely to develop this condition: A weak body's defense system, also called the immune system. A condition that affects your lungs and breathing, such as asthma. What are the signs or symptoms? Common symptoms of this condition include: Coughing. This may bring up clear, yellow, or green mucus from your lungs (sputum). Wheezing. Runny or stuffy nose. Having too much mucus in your lungs (chest congestion). Shortness of breath. Aches and pains, including sore throat or chest. How is this diagnosed? This condition is usually diagnosed based on: Your symptoms and medical history. A physical exam. You may also have other tests, including tests to rule out other conditions, such as pneumonia. These tests include: A test of lung  function. Test of a mucus sample to look for the presence of bacteria. Tests to check the oxygen level in your blood. Blood tests. Chest X-ray. How is this treated? Most cases of acute bronchitis clear up over time without treatment. Your health care provider may recommend: Drinking more fluids to help thin your mucus so it is easier to cough up. Taking inhaled medicine (inhaler) to improve air flow in and out of your lungs. Using a vaporizer or a humidifier. These are machines that add water to the air to help you breathe better. Taking a medicine that thins mucus and clears congestion (expectorant). Taking a medicine that prevents or stops coughing (cough suppressant). It is not common to take an antibiotic medicine for this condition. Follow these instructions at home:  Take over-the-counter and prescription medicines only as told by your health care provider. Use an inhaler, vaporizer, or humidifier as told by your health care provider. Take two teaspoons (10 mL) of honey at bedtime to lessen coughing at night. Drink enough fluid to keep your urine pale yellow. Do not use any products that contain  nicotine or tobacco. These products include cigarettes, chewing tobacco, and vaping devices, such as e-cigarettes. If you need help quitting, ask your health care provider. Get plenty of rest. Return to your normal activities as told by your health care provider. Ask your health care provider what activities are safe for you. Keep all follow-up visits. This is important. How is this prevented? To lower your risk of getting this condition again: Wash your hands often with soap and water for at least 20 seconds. If soap and water are not available, use hand sanitizer. Avoid contact with people who have cold symptoms. Try not to touch your mouth, nose, or eyes with your hands. Avoid breathing in smoke or chemical fumes. Breathing smoke or chemical fumes will make your condition worse. Get the flu shot every year. Contact a health care provider if: Your symptoms do not improve after 2 weeks. You have trouble coughing up the mucus. Your cough keeps you awake at night. You have a fever. Get help right away if you: Cough up blood. Feel pain in your chest. Have severe shortness of breath. Faint or keep feeling like you are going to faint. Have a severe headache. Have a fever or chills that get worse. These symptoms may represent a serious problem that is an emergency. Do not wait to see if the symptoms will go away. Get medical help right away. Call your local emergency services (911 in the [place] Do not drive yourself to the hospital. Summary Acute bronchitis is inflammation of the main airways (bronchi) that come off the windpipe (trachea) in the lungs. The swelling causes the airways to get smaller and make more mucus than normal. Drinking more fluids can help thin your mucus so it is easier to cough up. Take over-the-counter and prescription medicines only as told by your health care provider. Do not use any products that contain nicotine or tobacco. These products include cigarettes, chewing  tobacco, and vaping devices, such as e-cigarettes. If you need help quitting, ask your health care provider. Contact a health care provider if your symptoms do not improve after 2 weeks. This information is not intended to replace advice given to you by your health care provider. Make sure you discuss any questions you have with your health care provider. Document Revised: 05/25/2021 Document Reviewed: 06/15/2020 Elsevier Patient Education  2024 [employer].   If you have  been instructed to have an in-person evaluation today at a local Urgent Care facility, please use the link below. It will take you to a list of all of our available [facility], including address, phone number and hours of operation. Please do not delay care.  [facility]  If you or a family member do not have a primary care provider, use the link below to schedule a visit and establish care. When you choose a [facility] primary care physician or advanced practice provider, you gain a long-term partner in health. Find a Primary Care Provider  Learn more about [facility] in-office and virtual care options:  - Get Care Now

## 2023-06-25 ENCOUNTER — Ambulatory Visit (INDEPENDENT_AMBULATORY_CARE_PROVIDER_SITE_OTHER): Admitting: Radiology

## 2023-06-25 ENCOUNTER — Ambulatory Visit

## 2023-06-25 ENCOUNTER — Ambulatory Visit
Admission: RE | Admit: 2023-06-25 | Discharge: 2023-06-25 | Disposition: A | Discharge status: Home / Self Care | Source: Ambulatory Visit | Attending: Emergency Medicine | Admitting: Emergency Medicine

## 2023-06-25 ENCOUNTER — Other Ambulatory Visit: Payer: Self-pay

## 2023-06-25 VITALS — BP 157/84 | HR 77 | Temp 97.7°F | Resp 20

## 2023-06-25 DIAGNOSIS — R051 Acute cough: Secondary | ICD-10-CM | POA: Diagnosis not present

## 2023-06-25 DIAGNOSIS — B9689 Other specified bacterial agents as the cause of diseases classified elsewhere: Secondary | ICD-10-CM

## 2023-06-25 MED ORDER — METHYLPREDNISOLONE SODIUM SUCC 125 MG IJ SOLR
60.0000 mg | Freq: Once | INTRAMUSCULAR | Status: AC
  Administered 2023-06-25: 60 mg via INTRAMUSCULAR

## 2023-06-25 MED ORDER — CEFDINIR 300 MG PO CAPS: 300.0000 mg | ORAL_CAPSULE | Freq: Two times a day (BID) | ORAL | 0 refills | Status: AC

## 2023-06-25 MED ORDER — ALBUTEROL SULFATE (2.5 MG/3ML) 0.083% IN NEBU: 2.5000 mg | INHALATION_SOLUTION | Freq: Four times a day (QID) | RESPIRATORY_TRACT | 12 refills | Status: AC | PRN

## 2023-06-25 MED ORDER — CYCLOBENZAPRINE HCL 10 MG PO TABS: 10.0000 mg | ORAL_TABLET | Freq: Two times a day (BID) | ORAL | 0 refills | Status: DC | PRN

## 2023-06-25 NOTE — Discharge Instructions (Addendum)
 Your chest xray was negative I am still going to treat you with an antibiotic to cover for a lingering bacterial infection.  Please take the cefdinir twice daily for 7 days in a row. Take with food to avoid upset stomach. Continue breathing treatments three times daily.  The streoid injection given today should also help with cough, wheezing, and chest discomfort.   Try the muscle relaxer (flexeril) twice daily, or just at bedtime if it makes you sleepy.  If after three full days of the antibiotic you do not feel better, or your symptoms have worsened, go to the emergency department.

## 2023-06-25 NOTE — ED Provider Notes (Signed)
 [facility]    CSN: [id number] Arrival date & time: 06/25/23  1135      History   Chief Complaint Chief Complaint  Patient presents with   Cough    Residual cough after seven day on prednisone  40 and Leviquin - Entered by patient    HPI Alejandra Hess is a 68 y.o. female.  Here with productive cough and shortness of breath, fatigue She did a video visit 8 days ago when symptoms began. She was treated with prednisone  burst and levaquin  daily x 7 days. Also given tessalon  and promethazine  DM.  Symptoms are persisting and may be worsening. Chest discomfort from coughing. She reports low grade fever but no measurements over 100 degrees. Also trouble sleeping due to cough despite propping up.  History of asthma. 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   Overweight (BMI 25.0-29.9) 02/09/2022   Abnormal EKG 01/17/2022   Need for shingles vaccine 01/17/2022   Cough 01/02/2022   Medication side effect 09/26/2021   Uncontrolled type 2 diabetes mellitus with hyperglycemia (HCC) 05/25/2021   Dysfunction of left eustachian tube 05/25/2021   Ceruminosis, bilateral 10/27/2020   Essential hypertension 10/27/2020   Chronic eczematous otitis externa of both ears 10/27/2020   Acute non-recurrent maxillary sinusitis 09/22/2020   Adrenal nodule (HCC) 09/20/2020   Chronic cough    Heartburn    Asthma 10/08/2019   Hiatal hernia 08/16/2019   Schatzki's ring  08/16/2019   Gastritis 08/16/2019   History of colonic polyps 08/16/2019   Vasculitis (HCC) 05/04/2019   History of COVID-19 04/03/2019   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/03/2019   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   Other fatigue 01/20/2019   Hot flashes 01/20/2019   COVID-19 virus infection 12/12/2018   Viral syndrome 12/08/2018   Reactive airway disease 12/08/2018   Glaucoma 11/24/2018   Gastroesophageal reflux disease 11/21/2018   H/O esophagitis 11/21/2018   Elevated LDL cholesterol level 07/04/2018   Type 2 diabetes mellitus without complication, without long-term current use of insulin (HCC) 07/04/2018   Healthcare maintenance 01/27/2018   Asthmatic bronchitis 08/19/2017    Past Surgical History:  Procedure Laterality Date   24 HOUR PH STUDY N/A 11/18/2019   Procedure: 24 HOUR PH STUDY;  Surgeon: Normie Becton., MD;  Location: [facility];  Service: Gastroenterology;  Laterality: N/A;   ABDOMINAL HYSTERECTOMY     AUGMENTATION MAMMAPLASTY Bilateral    @ 43 lift with implants   BREAST EXCISIONAL BIOPSY Right    @ 53   BREAST EXCISIONAL BIOPSY Left    @43 ?   CHOLECYSTECTOMY     COLONOSCOPY     ESOPHAGEAL MANOMETRY N/A 11/18/2019   Procedure: ESOPHAGEAL MANOMETRY (EM);  Surgeon: Normie Becton., MD;  Location: [facility];  Service: Gastroenterology;  Laterality: N/A;   LAPAROSCOPIC APPENDECTOMY N/A 01/29/2022   Procedure: LAPAROSCOPIC APPENDECTOMY;  Surgeon: Melvenia Stabs,  MD;  Location: [facility];  Service: General;  Laterality: N/A;   UPPER GASTROINTESTINAL ENDOSCOPY      OB History     Gravida  4   Para  2   Term      Preterm      AB  2   Living  2      SAB  2   IAB      Ectopic      Multiple      Live Births               Home Medications    Prior to Admission medications   Medication Sig Start Date End Date Taking? Authorizing Provider  cefdinir (OMNICEF) 300 MG capsule Take 1 capsule (300 mg total) by mouth 2 (two) times daily for 7  days. 06/25/23 07/02/23 Yes Avereigh Spainhower, Ivette Marks, PA-C  cyclobenzaprine (FLEXERIL) 10 MG tablet Take 1 tablet (10 mg total) by mouth 2 (two) times daily as needed for muscle spasms. 06/25/23  Yes Rawleigh Rode, Ivette Marks, PA-C  albuterol  (PROVENTIL ) (2.5 MG/3ML) 0.083% nebulizer solution Take 3 mLs (2.5 mg total) by nebulization every 6 (six) hours as needed for shortness of breath or wheezing. PRN 06/25/23   Stephano Arrants, Ivette Marks, PA-C  albuterol  (VENTOLIN  HFA) 108 (90 Base) MCG/ACT inhaler Inhale 1-2 puffs into the lungs every 6 (six) hours as needed for wheezing or shortness of breath. 05/18/16   [provider]  benzonatate  (TESSALON ) 100 MG capsule Take 1-2 capsules (100-200 mg total) by mouth 3 (three) times daily as needed. 06/17/23   Burnette, Jennifer M, PA-C  budesonide -formoterol  (SYMBICORT ) 160-4.5 MCG/ACT inhaler Inhale 1 puff into the lungs at bedtime as needed. 03/03/18   Tonna Frederic, MD  carvedilol  (COREG ) 6.25 MG tablet TAKE ONE TABLET BY MOUTH TWICE A DAY WITH A MEAL 03/11/23   Tonna Frederic, MD  cetirizine (ZYRTEC) 10 MG tablet Take 10 mg by mouth daily.    [provider]  glucose blood (ONETOUCH VERIO) test strip Check blood sugar before 3 meals and at bedtime. 12/27/21   Tonna Frederic, MD  Lancets [facility] DELICA PLUS [place]) MISC 1 each by Does not apply route in the morning, at noon, in the evening, and at bedtime. 12/29/21   Tonna Frederic, MD  magnesium oxide (MAG-OX) 400 MG tablet Take 400 mg by mouth daily.    [provider]  metFORMIN  (GLUCOPHAGE -XR) 750 MG 24 hr tablet TAKE ONE TABLET BY MOUTH TWICE A DAY AFTER A MEAL 03/11/23   Tonna Frederic, MD  Multiple Vitamin (MULTIVITAMIN) tablet Take 1 tablet by mouth daily.    [provider]  olmesartan  (BENICAR ) 40 MG tablet Take 1 tablet (40 mg total) by mouth daily. 03/11/23   Tonna Frederic, MD  pantoprazole  (PROTONIX ) 40 MG tablet Take 1 tablet (40 mg total) by  mouth daily. Please call [phone] for further refills. 09/06/22   Cirigliano, Vito V, DO  promethazine -dextromethorphan (PROMETHAZINE -DM) 6.25-15 MG/5ML syrup Take 5 mLs by mouth 4 (four) times daily as needed. 06/17/23   [name], PA-C  rosuvastatin  (CRESTOR ) 20 MG tablet Take 1 tablet (20 mg total) by mouth daily. 03/11/23   Tonna Frederic, MD  tirzepatide  (MOUNJARO ) 10 MG/0.5ML Pen Inject 10 mg into the skin once a week. 05/28/23   Tonna Frederic, MD  triamcinolone  (NASACORT ) 55 MCG/ACT AERO nasal inhaler Place 1 spray into the nose daily.    [provider]  valACYclovir  (VALTREX ) 1000 MG  tablet Take 1/2 tablet 2 times daily for 3 days as needed for outbreaks. 08/24/22   Tonna Frederic, MD  venlafaxine  XR (EFFEXOR -XR) 75 MG 24 hr capsule TAKE 1 CAPSULE DAILY WITH BREAKFAST 05/08/23   Cirigliano, Laquetta Plank, DO    Family History Family History  Problem Relation Age of Onset   Hearing loss Mother    Hyperlipidemia Mother    Hypertension Mother    Diabetes Mother    Heart attack Father    Heart disease Father    Hyperlipidemia Father    Hypertension Father    Melanoma Father    Asthma Sister    Hyperlipidemia Sister    Hypertension Sister    Hyperlipidemia Sister    Breast cancer Sister 88   Asthma Sister    Depression Sister    Heart attack Sister    Heart disease Sister    Hypertension Sister    Diabetes Sister    Breast cancer Maternal Aunt        over 41   Breast cancer Paternal Aunt        over 33   Breast cancer Paternal Grandmother        over 75    Breast cancer Maternal Aunt        over 71    Colon cancer Maternal Grandfather    Esophageal cancer Neg Hx    Inflammatory bowel disease Neg Hx    Liver disease Neg Hx    Pancreatic cancer Neg Hx    Rectal cancer Neg Hx    Stomach cancer Neg Hx     Social History Social History   Tobacco Use   Smoking status: Former    Current packs/day: 0.00    Types: Cigarettes     Quit date: 2013    Years since quitting: 12.3   Smokeless tobacco: Never  Vaping Use   Vaping status: Never Used  Substance Use Topics   Alcohol use: Yes    Comment: 2 glasses of red wine daily   Drug use: Never     Allergies   Azithromycin, Chlorhexidine  gluconate, Jardiance  [empagliflozin ], and Penicillins   Review of Systems Review of Systems Per HPI  Physical Exam Triage Vital Signs ED Triage Vitals  Encounter Vitals Group     BP 06/25/23 1209 (!) 157/84     Systolic BP Percentile --      Diastolic BP Percentile --      Pulse Rate 06/25/23 1209 77     Resp 06/25/23 1209 20     Temp 06/25/23 1209 97.7 F (36.5 C)     Temp Source 06/25/23 1209 Oral     SpO2 06/25/23 1209 97 %     Weight --      Height --      Head Circumference --      Peak Flow --      Pain Score 06/25/23 1223 0     Pain Loc --      Pain Education --      Exclude from Growth Chart --    No data found.  Updated Vital Signs BP (!) 157/84 (BP Location: Right Arm)   Pulse 77   Temp 97.7 F (36.5 C) (Oral)   Resp 20   SpO2 97%   Visual Acuity Right Eye Distance:   Left Eye Distance:   Bilateral Distance:    Right Eye Near:   Left Eye Near:    Bilateral Near:  Physical Exam Vitals and nursing note reviewed.  Constitutional:      General: She is not in acute distress.    Appearance: She is not ill-appearing or diaphoretic.  HENT:     Right Ear: Tympanic membrane and ear canal normal.     Left Ear: Tympanic membrane and ear canal normal.     Nose: No congestion or rhinorrhea.     Mouth/Throat:     Mouth: Mucous membranes are moist.     Pharynx: Oropharynx is clear. No posterior oropharyngeal erythema.  Eyes:     Conjunctiva/sclera: Conjunctivae normal.  Cardiovascular:     Rate and Rhythm: Normal rate and regular rhythm.     Pulses: Normal pulses.     Heart sounds: Normal heart sounds.  Pulmonary:     Effort: Pulmonary effort is normal. No respiratory distress.     Breath  sounds: Normal breath sounds. No wheezing, rhonchi or rales.  Musculoskeletal:     Cervical back: Normal range of motion. No rigidity or tenderness.  Lymphadenopathy:     Cervical: No cervical adenopathy.  Skin:    General: Skin is warm and dry.  Neurological:     Mental Status: She is alert and oriented to person, place, and time.      UC Treatments / Results  Labs (all labs ordered are listed, but only abnormal results are displayed) Labs Reviewed - No data to display  EKG   Radiology DG Chest 2 View Result Date: 06/25/2023 CLINICAL DATA:  Persistent cough. EXAM: CHEST - 2 VIEW COMPARISON:  Chest radiograph dated 12/26/2021 FINDINGS: Bibasilar linear atelectasis/scarring. No focal consolidation, pleural effusion, pneumothorax. Stable cardiac silhouette. No acute osseous pathology. IMPRESSION: No active cardiopulmonary disease. Electronically Signed   By: Angus Bark M.D.   On: 06/25/2023 12:53    Procedures Procedures (including critical care time)  Medications Ordered in UC Medications  methylPREDNISolone  sodium succinate (SOLU-MEDROL ) 125 mg/2 mL injection 60 mg (60 mg Intramuscular Given 06/25/23 1314)    Initial Impression / Assessment and Plan / UC Course  I have reviewed the triage vital signs and the nursing notes.  Pertinent labs & imaging results that were available during my care of the patient were reviewed by me and considered in my medical decision making (see chart for details).   Currently afebrile. Clear lungs, no wheezing No acute distress Chest xray is negative. Images independently reviewed by me, agree with radiology interpretation. IM steroid given in clinic, not wheezing currently but reported at home. May help with chest discomfort from cough as well. Will go ahead and cover with another antibiotic given worsening of symptoms despite levaquin . Patient does not tolerate macrolides or penicillins. She does not like doxycycline  and states it does  not work. Instead of combo therapy with doxy, will try cefdinir BID x 7 days for now given normal xray. Discussed if symptoms worsen, or do not improve, after 3 full days of new antibiotic, emergency department is advised for outpatient treatment failure.  Patient agrees to plan, no questions at this time  Patient requests refill of albuterol  solution which is sent  Also will try muscle relaxer for sleep and chest/rib discomfort from cough  Final Clinical Impressions(s) / UC Diagnoses   Final diagnoses:  Acute cough     Discharge Instructions      Your chest xray was negative I am still going to treat you with an antibiotic to cover for a lingering bacterial infection.  Please take the cefdinir twice daily  for 7 days in a row. Take with food to avoid upset stomach. Continue breathing treatments three times daily.  The streoid injection given today should also help with cough, wheezing, and chest discomfort.   Try the muscle relaxer (flexeril) twice daily, or just at bedtime if it makes you sleepy.  If after three full days of the antibiotic you do not feel better, or your symptoms have worsened, go to the emergency department.      ED Prescriptions     Medication Sig Dispense Auth. Provider   cefdinir (OMNICEF) 300 MG capsule Take 1 capsule (300 mg total) by mouth 2 (two) times daily for 7 days. 14 capsule Jaiyden Laur, PA-C   cyclobenzaprine (FLEXERIL) 10 MG tablet Take 1 tablet (10 mg total) by mouth 2 (two) times daily as needed for muscle spasms. 20 tablet Annelise Mccoy, PA-C   albuterol  (PROVENTIL ) (2.5 MG/3ML) 0.083% nebulizer solution Take 3 mLs (2.5 mg total) by nebulization every 6 (six) hours as needed for shortness of breath or wheezing. PRN 75 mL Lawyer Washabaugh, Ivette Marks, PA-C      PDMP not reviewed this encounter.   Tahira Olivarez, Ivette Marks, PA-C 06/25/23 1330

## 2023-06-25 NOTE — ED Triage Notes (Addendum)
 Pt presents with complaints of productive cough and SOB x 10 days. Symptoms are worse in the morning. Pt was seen previously on 4/21 and was prescribed prednisone  + Levaquin . Medications are complete however symptoms are still lingering on. Pt currently denies pain. States she is exhausted. Chest discomfort from coughing. Low grade fever for a week. Ibuprofen taken.   Three (albuterol ) breathing treatments a day. One this morning with temporary improvement.

## 2023-06-27 ENCOUNTER — Encounter: Payer: Self-pay | Admitting: Family Medicine

## 2023-06-28 ENCOUNTER — Encounter (HOSPITAL_BASED_OUTPATIENT_CLINIC_OR_DEPARTMENT_OTHER): Payer: Self-pay

## 2023-06-28 ENCOUNTER — Emergency Department (HOSPITAL_BASED_OUTPATIENT_CLINIC_OR_DEPARTMENT_OTHER)
Admission: EM | Admit: 2023-06-28 | Discharge: 2023-06-28 | Disposition: A | Discharge status: Home / Self Care | Attending: Emergency Medicine | Admitting: Emergency Medicine

## 2023-06-28 ENCOUNTER — Other Ambulatory Visit: Payer: Self-pay

## 2023-06-28 ENCOUNTER — Encounter: Payer: Self-pay | Admitting: Family Medicine

## 2023-06-28 ENCOUNTER — Ambulatory Visit (HOSPITAL_BASED_OUTPATIENT_CLINIC_OR_DEPARTMENT_OTHER)
Admission: RE | Admit: 2023-06-28 | Discharge: 2023-06-28 | Disposition: A | Discharge status: Home / Self Care | Source: Ambulatory Visit | Attending: Family Medicine | Admitting: Family Medicine

## 2023-06-28 DIAGNOSIS — E119 Type 2 diabetes mellitus without complications: Secondary | ICD-10-CM | POA: Diagnosis not present

## 2023-06-28 DIAGNOSIS — Z8616 Personal history of COVID-19: Secondary | ICD-10-CM | POA: Diagnosis not present

## 2023-06-28 DIAGNOSIS — I10 Essential (primary) hypertension: Secondary | ICD-10-CM | POA: Diagnosis not present

## 2023-06-28 DIAGNOSIS — R059 Cough, unspecified: Secondary | ICD-10-CM | POA: Diagnosis not present

## 2023-06-28 DIAGNOSIS — R61 Generalized hyperhidrosis: Secondary | ICD-10-CM

## 2023-06-28 DIAGNOSIS — R053 Chronic cough: Secondary | ICD-10-CM | POA: Insufficient documentation

## 2023-06-28 DIAGNOSIS — I700 Atherosclerosis of aorta: Secondary | ICD-10-CM | POA: Diagnosis not present

## 2023-06-28 DIAGNOSIS — R0789 Other chest pain: Secondary | ICD-10-CM | POA: Insufficient documentation

## 2023-06-28 LAB — BASIC METABOLIC PANEL WITH GFR
Anion gap: 8 (ref 5–15)
BUN: 18 mg/dL (ref 8–23)
CO2: 31 mmol/L (ref 22–32)
Calcium: 10.3 mg/dL (ref 8.9–10.3)
Chloride: 100 mmol/L (ref 98–111)
Creatinine, Ser: 0.83 mg/dL (ref 0.44–1.00)
GFR, Estimated: >60 mL/min (ref >60)
Glucose, Bld: 124 mg/dL — ABNORMAL HIGH (ref 70–99)
Potassium: 3.9 mmol/L (ref 3.5–5.1)
Sodium: 139 mmol/L (ref 135–145)

## 2023-06-28 LAB — CBC
HCT: 41.2 % (ref 36.0–46.0)
Hemoglobin: 13.8 g/dL (ref 12.0–15.0)
MCH: 31.4 pg (ref 26.0–34.0)
MCHC: 33.5 g/dL (ref 30.0–36.0)
MCV: 93.6 fL (ref 80.0–100.0)
Platelets: 271 10*3/uL (ref 150–400)
RBC: 4.4 MIL/uL (ref 3.87–5.11)
RDW: 13.6 % (ref 11.5–15.5)
WBC: 8.8 10*3/uL (ref 4.0–10.5)
nRBC: 0 % (ref 0.0–0.2)

## 2023-06-28 LAB — HEPATIC FUNCTION PANEL
ALT: 27 U/L (ref 0–44)
AST: 18 U/L (ref 15–41)
Albumin: 4.3 g/dL (ref 3.5–5.0)
Alkaline Phosphatase: 83 U/L (ref 38–126)
Bilirubin, Direct: 0.2 mg/dL (ref 0.0–0.2)
Indirect Bilirubin: 0.2 mg/dL — ABNORMAL LOW (ref 0.3–0.9)
Total Bilirubin: 0.5 mg/dL (ref 0.0–1.2)
Total Protein: 7.1 g/dL (ref 6.5–8.1)

## 2023-06-28 LAB — TROPONIN T, HIGH SENSITIVITY: Troponin T High Sensitivity: 17 ng/L (ref <19)

## 2023-06-28 LAB — LIPASE, BLOOD: Lipase: 27 U/L (ref 11–51)

## 2023-06-28 MED ORDER — PREDNISONE 10 MG (21) PO TBPK: ORAL_TABLET | Freq: Every day | ORAL | 0 refills | Status: DC

## 2023-06-28 NOTE — ED Provider Notes (Signed)
 Emergency Department Provider Note   I have reviewed the triage vital signs and the nursing notes.   HISTORY  Chief Complaint Chest Pain   HPI Alejandra Hess is a 68 y.o. female with past history of diabetes, hypertension, hyperlipidemia presents emergency department for evaluation of persistent cough and chest congestion.  With her forceful coughing episodes she has associated chest discomfort.  She has not been having fevers.  Congestion and wheezing are worse at night.  She has been using her nebulizers 3 times a day with no significant relief in symptoms.  Symptoms initially began with the telehealth visit and 4 days of 40 mg prednisone  along with 7 days of Levaquin .  Symptoms did not resolve and she was seen at urgent care on 4/29.  She was started on cefdinir  and given an additional single dose of steroid.  She continues to have symptoms at home.  She had a CT chest performed this morning without contrast which shows no acute finding.  She states this was already ordered by her PCP with some night sweats and cough earlier. No CP at rest.   Past Medical History:  Diagnosis Date   Allergy     Asthma    COVID-19    DM (diabetes mellitus) (HCC)    GERD (gastroesophageal reflux disease)    Heart murmur    as a child   HLD (hyperlipidemia)    HSV-1 infection    Hypertension    Pneumonia     Review of Systems  Constitutional: No fever/chills Cardiovascular: Positive CP with coughing.  Respiratory: Positive shortness of breath and cough.  Gastrointestinal: No abdominal pain.  No vomiting.  Skin: Negative for rash.  ____________________________________________   PHYSICAL EXAM:  VITAL SIGNS: ED Triage Vitals  Encounter Vitals Group     BP 06/28/23 0838 (!) 175/86     Pulse Rate 06/28/23 0838 75     Resp --      Temp 06/28/23 0838 98 F (36.7 C)     Temp Source 06/28/23 0838 Oral     SpO2 06/28/23 0838 98 %     Weight 06/28/23 0838 155 lb (70.3 kg)     Height  06/28/23 0838 5\' 4"  (1.626 m)   Constitutional: Alert and oriented. Well appearing and in no acute distress. Eyes: Conjunctivae are normal. Head: Atraumatic. Nose: No congestion/rhinnorhea. Mouth/Throat: Mucous membranes are moist.  Neck: No stridor.   Cardiovascular: Normal rate, regular rhythm. Good peripheral circulation. Grossly normal heart sounds.   Respiratory: Normal respiratory effort.  No retractions. Lungs CTAB. No wheezing.  Gastrointestinal: No distention.  Musculoskeletal: No gross deformities of extremities. Neurologic:  Normal speech and language.  Skin:  Skin is warm, dry and intact. No rash noted.  ____________________________________________   LABS (all labs ordered are listed, but only abnormal results are displayed)  Labs Reviewed  BASIC METABOLIC PANEL WITH GFR - Abnormal; Notable for the following components:      Result Value   Glucose, Bld 124 (*)    All other components within normal limits  HEPATIC FUNCTION PANEL - Abnormal; Notable for the following components:   Indirect Bilirubin 0.2 (*)    All other components within normal limits  CBC  LIPASE, BLOOD  TROPONIN T, HIGH SENSITIVITY   ____________________________________________  EKG  Rate: 68 PR: 182 NSR. Narrow QRS. No ST elevation or depression. No STEMI.    ____________________________________________  RADIOLOGY  No results found.   ____________________________________________   PROCEDURES  Procedure(s) performed:  Procedures  None  ____________________________________________   INITIAL IMPRESSION / ASSESSMENT AND PLAN / ED COURSE  Pertinent labs & imaging results that were available during my care of the patient were reviewed by me and considered in my medical decision making (see chart for details).   This patient is Presenting for Evaluation of CP, which does require a range of treatment options, and is a complaint that involves a high risk of morbidity and  mortality.  The Differential Diagnoses includes but is not exclusive to acute coronary syndrome, aortic dissection, pulmonary embolism, cardiac tamponade, community-acquired pneumonia, pericarditis, musculoskeletal chest wall pain, etc.  Clinical Laboratory Tests Ordered, included troponin negative. No AKI. LFTs normal. Normal lipase.   Radiologic Tests: Considered chest imaging but patient had CT chest without contrast this morning showing no acute finding in the chest.  Cardiac Monitor Tracing which shows NSR.    Social Determinants of Health Risk patient is a former smoker but quit many years prior.   Medical Decision Making: Summary:  The patient presents emergency department for evaluation of persistent cough and chest discomfort with cough and congestion.  Vital signs within normal limits other than mild hypertension.  Lungs are clear. No neb treatments here. Plan for screening labs and reassess.   Reevaluation with update and discussion with patient. Plan for steroid burst with taper. May benefit from additional PCP follow up vs pulmonology follow up. No O2 requirement in the ED to require admit, although considered.   Patient's presentation is most consistent with acute presentation with potential threat to life or bodily function.   Disposition: discharge  ____________________________________________  FINAL CLINICAL IMPRESSION(S) / ED DIAGNOSES  Final diagnoses:  Atypical chest pain  Persistent cough     NEW OUTPATIENT MEDICATIONS STARTED DURING THIS VISIT:  Discharge Medication List as of 06/28/2023 11:31 AM     START taking these medications   Details  predniSONE  (STERAPRED UNI-PAK 21 TAB) 10 MG (21) TBPK tablet Take by mouth daily. Take 6 tabs by mouth daily  for 2 days, then 5 tabs for 2 days, then 4 tabs for 2 days, then 3 tabs for 2 days, 2 tabs for 2 days, then 1 tab by mouth daily for 2 days, Starting Fri 06/28/2023, Normal        Note:  This document was  prepared using Dragon voice recognition software and may include unintentional dictation errors.  Abby Hocking, MD, [facility]    Justin Buechner, Shereen Dike, MD 07/01/23 [phone]

## 2023-06-28 NOTE — ED Notes (Signed)
 Pt ambulated to restroom unassisted

## 2023-06-28 NOTE — ED Triage Notes (Signed)
 Pt reports that she has been having Bronchitis issues x 2 weeks. Was seen at [facility] on Monday and is still no better. Reports having chest pain. Pt states that she had a chest x-ray on Monday. Unsure of results. States maybe a collapsed lung.

## 2023-06-28 NOTE — Discharge Instructions (Addendum)
 You were seen in the emerged from today with persistent cough.  I am starting you on a steroid course.  Please take as prescribed and follow closely with your primary care doctor.  I have listed the name of a neurology group in [place] to call for follow-up as well.  Your primary care doctor may have to provide a referral.  Return with any new or suddenly worsening symptoms.

## 2023-07-01 ENCOUNTER — Ambulatory Visit: Payer: Self-pay

## 2023-07-01 NOTE — Telephone Encounter (Signed)
 E2C2 Pulmonary Triage - Initial Assessment Questions "Chief Complaint (e.g., cough, sob, wheezing, fever, chills, sweat or additional symptoms) *Go to specific symptom protocol after initial questions. Patient with hx of asthma last seen by pulmonary in 2021 calling to schedule an appointment with pulmonary after ED visit on 06/28/2023. Patient endorses a couple of weeks of general illness. Patient reports mild shortness of breath. Endorses using her inhalers and nebulizer treatment. Patient states she is feeling good post ED visit. Patient was calling primarily to get an appointment to be seen by pulmonary. Appointment availability is explained to patient. Patient agreed to new patient appointment at [facility] office due to sooner availability. Patient was scheduled for Jul 16, 2023 at 8:30 AM. Patient verbalized understanding and all questions answered.   "How long have symptoms been present?" Couple of weeks  Have you tested for COVID or Flu? Note: If not, ask patient if a home test can be taken. If so, instruct patient to call back for positive results. No  MEDICINES:   "Have you used any OTC meds to help with symptoms?" No If yes, ask "What medications?"   "Have you used your inhalers/maintenance medication?" Yes If yes, "What medications?" Albuterol  inhaler Albuterol  nebulizer Symbicort  inhaler  If inhaler, ask "How many puffs and how often?" Note: Review instructions on medication in the chart. Albuterol  inhaler 1-2 puffs q6h Symbicort  1 puff PRN  OXYGEN: "Do you wear supplemental oxygen?" No If yes, "How many liters are you supposed to use?"   "Do you monitor your oxygen levels?" Yes If yes, "What is your reading (oxygen level) today?" 96  "What is your usual oxygen saturation reading?"  (Note: Pulmonary O2 sats should be 90% or greater) 96%  Copied from CRM #[id number]. Topic: Clinical - Red Word Triage >> Jul 01, 2023  1:23 PM Alejandra Hess wrote: Red Word that prompted transfer  to Nurse Triage: hx of asthma, went to ED on 5/02.  SOB Reason for Disposition  [1] MODERATE longstanding difficulty breathing (e.g., speaks in phrases, SOB even at rest, pulse 100-120) AND [2] SAME as normal  Answer Assessment - Initial Assessment Questions 1. RESPIRATORY STATUS: "Describe your breathing?" (e.g., wheezing, shortness of breath, unable to speak, severe coughing)      Shortness of breath, hx of asthma 2. ONSET: "When did this breathing problem begin?"      Has been going on for awhile and developed bronchitis 3. PATTERN "Does the difficult breathing come and go, or has it been constant since it started?"      constant 4. SEVERITY: "How bad is your breathing?" (e.g., mild, moderate, severe)    - MILD: No SOB at rest, mild SOB with walking, speaks normally in sentences, can lie down, no retractions, pulse < 100.    - MODERATE: SOB at rest, SOB with minimal exertion and prefers to sit, cannot lie down flat, speaks in phrases, mild retractions, audible wheezing, pulse 100-120.    - SEVERE: Very SOB at rest, speaks in single words, struggling to breathe, sitting hunched forward, retractions, pulse > 120      no 5. RECURRENT SYMPTOM: "Have you had difficulty breathing before?" If Yes, ask: "When was the last time?" and "What happened that time?"      Yes but has been several years 6. CARDIAC HISTORY: "Do you have any history of heart disease?" (e.g., heart attack, angina, bypass surgery, angioplasty)      no 7. LUNG HISTORY: "Do you have any history of lung disease?"  (  e.g., pulmonary embolus, asthma, emphysema)     asthma 8. CAUSE: "What do you think is causing the breathing problem?"      Asthma exacerbation 9. OTHER SYMPTOMS: "Do you have any other symptoms? (e.g., dizziness, runny nose, cough, chest pain, fever)     no 10. O2 SATURATION MONITOR:  "Do you use an oxygen saturation monitor (pulse oximeter) at home?" If Yes, ask: "What is your reading (oxygen level) today?" "What  is your usual oxygen saturation reading?" (e.g., 95%)       96% 12. TRAVEL: "Have you traveled out of the country in the last month?" (e.g., travel history, exposures)       no  Protocols used: Breathing Difficulty-A-AH

## 2023-07-04 ENCOUNTER — Ambulatory Visit: Admitting: Family Medicine

## 2023-07-09 ENCOUNTER — Other Ambulatory Visit: Payer: Self-pay | Admitting: Gastroenterology

## 2023-07-11 DIAGNOSIS — H04123 Dry eye syndrome of bilateral lacrimal glands: Secondary | ICD-10-CM | POA: Diagnosis not present

## 2023-07-11 DIAGNOSIS — H40023 Open angle with borderline findings, high risk, bilateral: Secondary | ICD-10-CM | POA: Diagnosis not present

## 2023-07-11 DIAGNOSIS — H2513 Age-related nuclear cataract, bilateral: Secondary | ICD-10-CM | POA: Diagnosis not present

## 2023-07-11 DIAGNOSIS — E119 Type 2 diabetes mellitus without complications: Secondary | ICD-10-CM | POA: Diagnosis not present

## 2023-07-11 DIAGNOSIS — H0102B Squamous blepharitis left eye, upper and lower eyelids: Secondary | ICD-10-CM | POA: Diagnosis not present

## 2023-07-11 DIAGNOSIS — H0102A Squamous blepharitis right eye, upper and lower eyelids: Secondary | ICD-10-CM | POA: Diagnosis not present

## 2023-07-12 DIAGNOSIS — J4531 Mild persistent asthma with (acute) exacerbation: Secondary | ICD-10-CM | POA: Diagnosis not present

## 2023-07-12 DIAGNOSIS — R0989 Other specified symptoms and signs involving the circulatory and respiratory systems: Secondary | ICD-10-CM | POA: Diagnosis not present

## 2023-07-15 DIAGNOSIS — L723 Sebaceous cyst: Secondary | ICD-10-CM | POA: Diagnosis not present

## 2023-07-16 ENCOUNTER — Ambulatory Visit: Admitting: Pulmonary Disease

## 2023-07-24 ENCOUNTER — Other Ambulatory Visit: Payer: Self-pay | Admitting: Family Medicine

## 2023-07-24 DIAGNOSIS — E119 Type 2 diabetes mellitus without complications: Secondary | ICD-10-CM

## 2023-07-25 ENCOUNTER — Ambulatory Visit: Admitting: Gastroenterology

## 2023-07-25 ENCOUNTER — Encounter: Payer: Self-pay | Admitting: Gastroenterology

## 2023-07-25 ENCOUNTER — Other Ambulatory Visit: Payer: Self-pay

## 2023-07-25 VITALS — BP 130/80 | HR 78 | Ht 62.0 in | Wt 158.0 lb

## 2023-07-25 DIAGNOSIS — E119 Type 2 diabetes mellitus without complications: Secondary | ICD-10-CM | POA: Diagnosis not present

## 2023-07-25 DIAGNOSIS — I10 Essential (primary) hypertension: Secondary | ICD-10-CM | POA: Diagnosis not present

## 2023-07-25 DIAGNOSIS — R1032 Left lower quadrant pain: Secondary | ICD-10-CM

## 2023-07-25 DIAGNOSIS — R109 Unspecified abdominal pain: Secondary | ICD-10-CM

## 2023-07-25 DIAGNOSIS — R12 Heartburn: Secondary | ICD-10-CM

## 2023-07-25 DIAGNOSIS — Z860100 Personal history of colon polyps, unspecified: Secondary | ICD-10-CM

## 2023-07-25 DIAGNOSIS — E1169 Type 2 diabetes mellitus with other specified complication: Secondary | ICD-10-CM | POA: Diagnosis not present

## 2023-07-25 DIAGNOSIS — E559 Vitamin D deficiency, unspecified: Secondary | ICD-10-CM | POA: Diagnosis not present

## 2023-07-25 DIAGNOSIS — G588 Other specified mononeuropathies: Secondary | ICD-10-CM

## 2023-07-25 MED ORDER — PANTOPRAZOLE SODIUM 40 MG PO TBEC: 40.0000 mg | DELAYED_RELEASE_TABLET | Freq: Every day | ORAL | 3 refills | Status: DC

## 2023-07-25 NOTE — Patient Instructions (Signed)
 If your blood pressure at your visit was 140/90 or greater, please contact your primary care physician to follow up on this. ______________________________________________________  If you are age 70 or older, your body mass index should be between 23-30. Your Body mass index is 28.9 kg/m. If this is out of the aforementioned range listed, please consider follow up with your Primary Care Provider.  If you are age 60 or younger, your body mass index should be between 19-25. Your Body mass index is 28.9 kg/m. If this is out of the aformentioned range listed, please consider follow up with your Primary Care Provider.  ________________________________________________________  The [facility] providers would like to encourage you to use MYCHART to communicate with providers for non-urgent requests or questions.  Due to long hold times on the telephone, sending your provider a message by [facility] may be a faster and more efficient way to get a response.  Please allow 48 business hours for a response.  Please remember that this is for non-urgent requests.  _______________________________________________________  Due to recent changes in healthcare laws, you may see the results of your imaging and laboratory studies on MyChart before your provider has had a chance to review them.  We understand that in some cases there may be results that are confusing or concerning to you. Not all laboratory results come back in the same time frame and the provider may be waiting for multiple results in order to interpret others.  Please give us  48 hours in order for your provider to thoroughly review all the results before contacting the office for clarification of your results.    Please follow up as needed after your Abdominal wall injection.  It was a pleasure to see you today!  Thank you for trusting me with your gastrointestinal care!

## 2023-07-25 NOTE — Progress Notes (Signed)
 Chief Complaint:    Abdominal pain  GI History: Alejandra Hess is a 68 year old female with a history of diabetes, HTN, HLD, asthma, prior hemorrhoidectomy 35+ years ago, initially referred to me in 10/2019 by Dr. Brice Campi for evaluation of possible antireflux intervention with Transoral Incisionless Fundoplication (TIF). Was subsequently diagnosed with functional heartburn, so antireflux surgery not indicated.  Was started on Effexor  with good clinical response.    GERD history: -Index symptoms: HB, sinus congestion, sore throat, nocturnal cough -Exacerbating features: Nocturnal (cough, HB), post prandial -Medications trialed: Dexilant , Domperidone, omeprazole , Zantac, Pepcid , Carafate  -Current medications: Effexor  50 mg/day, Protonix  40 mg QAM; HOB elevation -Complications: None   GERD evaluation: -Last EGD: 07/2019 -Barium esophagram: None -Esophageal Manometry:  10/2019: Normal -pH/Impedance:  10/2019: [place] 11, pH <4 of 3.7%, normal impedance, negative symptom correlation by SAP c/w Functional heartburn.  Started Effexor   -Bravo: N/A   Endoscopic History: - Colonoscopy (04/2015, [place], [place]): 3-5 mm sessile polyp at AO (path: Sessile serrated adenoma), 3-5 mm polyp in sigmoid (path: HP). -EGD (05/2019, Dr. Brice Campi): Schatzki's ring, small <2 cm hiatal hernia, normal Z-line, gastric polyps - Colonoscopy (10/27/2021): 8 mm adenomatous polyp (path traditional serrated adenoma) invading into appendiceal orifice, unable to completely resect with combination of cold snare and cold forceps, 2 small 2-3 mm transverse colon/ascending colon adenomas, small internal hemorrhoids.  Referred to Colorectal Surgery - EGD (10/27/2021): Normal esophagus, benign fundic gland polyps, minimal antral gastritis, normal duodenum with benign path - 01/29/2022: Appendectomy with resection of sessile serrated adenoma with negative margins  HPI:     Patient is a 68 y.o. female presenting to the  Gastroenterology Clinic for follow-up.  Was last seen in the office on 09/25/2021.  At that time, heartburn was controlled with Effexor  and Protonix , and she was reluctant to remove the Protonix .  Main issue today is LLQ pain.  Pain is always located in the same spot, described as sharp and can radiate to Alejandra Hess back.  Unrelated to p.o. intake or bowel habits.  Separately, she was having night sweats.  Alejandra Hess Endocrinologist stopped Effexor  and those stopped.   Reviewed most recent labs from earlier this month: Normal CBC, CMP, lipase.  Underwent CT abdomen/pelvis on 04/25/2023 which was only notable for moderate retained stool but no acute intra-abdominal pathology.  Review of systems:     No chest pain, no SOB, no fevers, no urinary sx   Past Medical History:  Diagnosis Date   Allergy     Asthma    COVID-19    DM (diabetes mellitus) (HCC)    GERD (gastroesophageal reflux disease)    Heart murmur    as a child   HLD (hyperlipidemia)    HSV-1 infection    Hypertension    Pneumonia     Patient's surgical history, family medical history, social history, medications and allergies were all reviewed in Epic    Current Outpatient Medications  Medication Sig Dispense Refill   albuterol  (PROVENTIL ) (2.5 MG/3ML) 0.083% nebulizer solution Take 3 mLs (2.5 mg total) by nebulization every 6 (six) hours as needed for shortness of breath or wheezing. PRN 75 mL 12   albuterol  (VENTOLIN  HFA) 108 (90 Base) MCG/ACT inhaler Inhale 1-2 puffs into the lungs every 6 (six) hours as needed for wheezing or shortness of breath.     budesonide -formoterol  (SYMBICORT ) 160-4.5 MCG/ACT inhaler Inhale 1 puff into the lungs at bedtime as needed. 1 Inhaler 3   carvedilol  (COREG ) 6.25 MG tablet TAKE ONE TABLET BY MOUTH TWICE  A DAY WITH A MEAL 180 tablet 0   cetirizine (ZYRTEC) 10 MG tablet Take 10 mg by mouth daily.     glucose blood (ONETOUCH VERIO) test strip Check blood sugar before 3 meals and at bedtime. 100 each 12    Lancets (ONETOUCH DELICA PLUS LANCET33G) MISC 1 each by Does not apply route in the morning, at noon, in the evening, and at bedtime. 100 each 3   metFORMIN  (GLUCOPHAGE -XR) 750 MG 24 hr tablet TAKE 1 TABLET BY MOUTH TWICE  DAILY AFTER A MEAL 180 tablet 3   Multiple Vitamin (MULTIVITAMIN) tablet Take 1 tablet by mouth daily.     olmesartan  (BENICAR ) 40 MG tablet Take 1 tablet (40 mg total) by mouth daily. 90 tablet 3   pantoprazole  (PROTONIX ) 40 MG tablet Take 1 tablet (40 mg total) by mouth daily. Please call [phone] for further refills. 90 tablet 3   rosuvastatin  (CRESTOR ) 20 MG tablet Take 1 tablet (20 mg total) by mouth daily. 90 tablet 3   tirzepatide  (MOUNJARO ) 10 MG/0.5ML Pen Inject 10 mg into the skin once a week. 6 mL 0   triamcinolone  (NASACORT ) 55 MCG/ACT AERO nasal inhaler Place 1 spray into the nose daily.     valACYclovir  (VALTREX ) 1000 MG tablet Take 1/2 tablet 2 times daily for 3 days as needed for outbreaks. 9 tablet 0   No current facility-administered medications for this visit.    Physical Exam:     BP 130/80   Pulse 78   Ht 5\' 2"  (1.575 m)   Wt 158 lb (71.7 kg)   BMI 28.90 kg/m   GENERAL:  Pleasant female in NAD PSYCH: : Cooperative, normal affect CARDIAC:  RRR, no murmur heard, no peripheral edema PULM: Normal respiratory effort, lungs CTA bilaterally, no wheezing ABDOMEN:  +Carnett's sign in LLQ with pinpoint TTP. 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  All risks, benefits and alternative forms of therapy were described and informed consent was obtained.  -The patient was  draped and the site prepped in the usual sterile fashion.  Site of pain was reconfirmed by manual palpation. -Lidocaine  1%.  Injected 5 cc into the site of pain, with resolution of pain upon repeat palpation.  This confirmed the diagnosis of Abdominal Wall Syndrome. -Kenalog  40 mg.  Injected directly into the site of pain for therapeutic intervention of the above confirmed Abdominal Wall Syndrome. -Band-Aid applied -The patient was observed in the Gastroenterology Clinic for 15 minutes after the procedure. No complications were encountered and the patient tolerated the procedure well.  -I explained that pain may recur and approximately 20% of patients.  If the pain does recur, can follow-up with me for possible repeat injection in 4+ weeks.  Otherwise, to follow-up as needed.  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  Effexor  recently discontinued due to night sweats.  If functional heartburn symptoms start to return, may need to consider starting TCA as alternative treatment option  4) History of colon polyps -Repeat colonoscopy in 01/2025 for ongoing polyp surveillance  I spent an additional 25 minutes of nonprocedural time, including in depth chart review, independent review of results as  outlined above, communicating results with the patient directly, face-to-face time with the patient, coordinating care, and ordering studies and medications as appropriate, and documentation.       Annis Kinder ,DO, FACG 07/25/2023, 9:13 AM

## 2023-08-10 ENCOUNTER — Other Ambulatory Visit: Payer: Self-pay | Admitting: Family Medicine

## 2023-08-10 DIAGNOSIS — E119 Type 2 diabetes mellitus without complications: Secondary | ICD-10-CM

## 2023-08-19 ENCOUNTER — Ambulatory Visit (INDEPENDENT_AMBULATORY_CARE_PROVIDER_SITE_OTHER): Admitting: Family Medicine

## 2023-08-19 ENCOUNTER — Encounter: Payer: Self-pay | Admitting: Family Medicine

## 2023-08-19 VITALS — BP 124/82 | HR 78 | Temp 97.8°F | Ht 64.0 in | Wt 151.0 lb

## 2023-08-19 DIAGNOSIS — E7800 Pure hypercholesterolemia, unspecified: Secondary | ICD-10-CM | POA: Diagnosis not present

## 2023-08-19 DIAGNOSIS — I10 Essential (primary) hypertension: Secondary | ICD-10-CM | POA: Diagnosis not present

## 2023-08-19 DIAGNOSIS — E119 Type 2 diabetes mellitus without complications: Secondary | ICD-10-CM

## 2023-08-19 NOTE — Progress Notes (Signed)
 Established Patient Office Visit   Subjective:  Patient ID: Alejandra Hess, female    DOB: August 11, 1955  Age: 68 y.o. MRN: [id number]  Chief Complaint  Patient presents with   Follow-up    8 week follow up     HPI Encounter Diagnoses  Name Primary?   Controlled type 2 diabetes mellitus without complication, without long-term current use of insulin (HCC) Yes   Essential hypertension    Elevated LDL cholesterol level    Headaches and night sweats have resolved after discontinuing Effexor .  Peripheral vision is normal after recent ophthalmology visit.  She is seeing endocrinology now for her diabetes.  Recent follow-up with pulmonology for asthma has been quite helpful for her.  Blood pressure well-controlled with carvedilol  and olmesartan ..  Cholesterol well-controlled with rosuvastatin    Review of Systems  Constitutional: Negative.  Negative for diaphoresis.  HENT: Negative.    Eyes:  Negative for blurred vision, discharge and redness.  Respiratory: Negative.    Cardiovascular: Negative.   Gastrointestinal:  Negative for abdominal pain.  Genitourinary: Negative.   Musculoskeletal: Negative.  Negative for myalgias.  Skin:  Negative for rash.  Neurological:  Negative for tingling, loss of consciousness, weakness and headaches.  Endo/Heme/Allergies:  Negative for polydipsia.     Current Outpatient Medications:    albuterol  (VENTOLIN  HFA) 108 (90 Base) MCG/ACT inhaler, Inhale 1-2 puffs into the lungs every 6 (six) hours as needed for wheezing or shortness of breath., Disp: , Rfl:    carvedilol  (COREG ) 6.25 MG tablet, TAKE ONE TABLET BY MOUTH TWICE A DAY WITH A MEAL, Disp: 180 tablet, Rfl: 0   cetirizine (ZYRTEC) 10 MG tablet, Take 10 mg by mouth daily., Disp: , Rfl:    glucose blood (ONETOUCH VERIO) test strip, Check blood sugar before 3 meals and at bedtime., Disp: 100 each, Rfl: 12   Lancets (ONETOUCH DELICA PLUS LANCET33G) MISC, 1 each by Does not apply route in the morning,  at noon, in the evening, and at bedtime., Disp: 100 each, Rfl: 3   metFORMIN  (GLUCOPHAGE -XR) 750 MG 24 hr tablet, TAKE 1 TABLET BY MOUTH TWICE  DAILY AFTER A MEAL, Disp: 180 tablet, Rfl: 3   MOUNJARO  10 MG/0.5ML Pen, INJECT 10 MG UNDER THE SKIN ONCE WEEKLY, Disp: 6 mL, Rfl: 0   Multiple Vitamin (MULTIVITAMIN) tablet, Take 1 tablet by mouth daily., Disp: , Rfl:    olmesartan  (BENICAR ) 40 MG tablet, Take 1 tablet (40 mg total) by mouth daily., Disp: 90 tablet, Rfl: 3   pantoprazole  (PROTONIX ) 40 MG tablet, Take 1 tablet (40 mg total) by mouth daily., Disp: 90 tablet, Rfl: 3   rosuvastatin  (CRESTOR ) 20 MG tablet, Take 1 tablet (20 mg total) by mouth daily., Disp: 90 tablet, Rfl: 3   triamcinolone  (NASACORT ) 55 MCG/ACT AERO nasal inhaler, Place 1 spray into the nose daily., Disp: , Rfl:    valACYclovir  (VALTREX ) 1000 MG tablet, Take 1/2 tablet 2 times daily for 3 days as needed for outbreaks., Disp: 9 tablet, Rfl: 0   albuterol  (PROVENTIL ) (2.5 MG/3ML) 0.083% nebulizer solution, Take 3 mLs (2.5 mg total) by nebulization every 6 (six) hours as needed for shortness of breath or wheezing. PRN (Patient not taking: Reported on 08/19/2023), Disp: 75 mL, Rfl: 12   budesonide -formoterol  (SYMBICORT ) 160-4.5 MCG/ACT inhaler, Inhale 1 puff into the lungs at bedtime as needed. (Patient not taking: Reported on 08/19/2023), Disp: 1 Inhaler, Rfl: 3   Objective:     BP 124/82 (BP Location: Right Arm, Patient Position:  Sitting, Cuff Size: Normal)   Pulse 78   Temp 97.8 F (36.6 C) (Temporal)   Ht 5' 4 (1.626 m)   Wt 151 lb (68.5 kg)   SpO2 94%   BMI 25.92 kg/m  Wt Readings from Last 3 Encounters:  08/19/23 151 lb (68.5 kg)  07/25/23 158 lb (71.7 kg)  06/28/23 155 lb (70.3 kg)      Physical Exam Constitutional:      General: She is not in acute distress.    Appearance: Normal appearance. She is not ill-appearing, toxic-appearing or diaphoretic.  HENT:     Head: Normocephalic and atraumatic.     Right  Ear: External ear normal.     Left Ear: External ear normal.     Mouth/Throat:     Mouth: Mucous membranes are moist.     Pharynx: Oropharynx is clear. No oropharyngeal exudate or posterior oropharyngeal erythema.   Eyes:     General: No scleral icterus.       Right eye: No discharge.        Left eye: No discharge.     Extraocular Movements: Extraocular movements intact.     Conjunctiva/sclera: Conjunctivae normal.     Pupils: Pupils are equal, round, and reactive to light.    Cardiovascular:     Rate and Rhythm: Normal rate and regular rhythm.  Pulmonary:     Effort: Pulmonary effort is normal. No respiratory distress.     Breath sounds: Normal breath sounds. No wheezing, rhonchi or rales.  Abdominal:     General: Bowel sounds are normal.   Musculoskeletal:     Cervical back: No rigidity or tenderness.  Lymphadenopathy:     Cervical: No cervical adenopathy.   Skin:    General: Skin is warm and dry.   Neurological:     Mental Status: She is alert and oriented to person, place, and time.   Psychiatric:        Mood and Affect: Mood normal.        Behavior: Behavior normal.      No results found for any visits on 08/19/23.    The ASCVD Risk score (Arnett DK, et al., 2019) failed to calculate for the following reasons:   The valid total cholesterol range is 130 to 320 mg/dL    Assessment & Plan:   Controlled type 2 diabetes mellitus without complication, without long-term current use of insulin (HCC)  Essential hypertension  Elevated LDL cholesterol level    Return in about 7 months (around 03/20/2024), or if symptoms worsen or fail to improve, for annual physical.  Continue current medications and follow-up with endocrinology and pulmonology.  Elsie Sim Lent, MD

## 2023-08-20 ENCOUNTER — Other Ambulatory Visit: Payer: Self-pay

## 2023-08-20 ENCOUNTER — Other Ambulatory Visit: Payer: Self-pay | Admitting: Family Medicine

## 2023-08-20 DIAGNOSIS — I10 Essential (primary) hypertension: Secondary | ICD-10-CM

## 2023-08-20 MED ORDER — CARVEDILOL 6.25 MG PO TABS: ORAL_TABLET | ORAL | 0 refills | Status: DC

## 2023-08-21 ENCOUNTER — Other Ambulatory Visit: Payer: Self-pay | Admitting: Family Medicine

## 2023-08-21 DIAGNOSIS — I10 Essential (primary) hypertension: Secondary | ICD-10-CM

## 2023-09-30 ENCOUNTER — Other Ambulatory Visit: Payer: Self-pay | Admitting: Family Medicine

## 2023-09-30 DIAGNOSIS — I10 Essential (primary) hypertension: Secondary | ICD-10-CM

## 2023-10-01 MED ORDER — CARVEDILOL 6.25 MG PO TABS: ORAL_TABLET | ORAL | 3 refills | Status: DC

## 2023-10-01 NOTE — Addendum Note (Signed)
 Addended by: BERNETA ELSIE LABOR on: 10/01/2023 08:15 AM   Modules accepted: Orders

## 2023-10-02 DIAGNOSIS — R0989 Other specified symptoms and signs involving the circulatory and respiratory systems: Secondary | ICD-10-CM | POA: Diagnosis not present

## 2023-10-02 DIAGNOSIS — J4531 Mild persistent asthma with (acute) exacerbation: Secondary | ICD-10-CM | POA: Diagnosis not present

## 2023-10-08 DIAGNOSIS — J302 Other seasonal allergic rhinitis: Secondary | ICD-10-CM | POA: Diagnosis not present

## 2023-10-08 DIAGNOSIS — E1169 Type 2 diabetes mellitus with other specified complication: Secondary | ICD-10-CM | POA: Diagnosis not present

## 2023-10-08 DIAGNOSIS — J8283 Eosinophilic asthma: Secondary | ICD-10-CM | POA: Diagnosis not present

## 2023-10-08 DIAGNOSIS — E119 Type 2 diabetes mellitus without complications: Secondary | ICD-10-CM | POA: Diagnosis not present

## 2023-10-08 DIAGNOSIS — I10 Essential (primary) hypertension: Secondary | ICD-10-CM | POA: Diagnosis not present

## 2023-10-08 DIAGNOSIS — K219 Gastro-esophageal reflux disease without esophagitis: Secondary | ICD-10-CM | POA: Diagnosis not present

## 2023-10-14 ENCOUNTER — Other Ambulatory Visit: Payer: Self-pay | Admitting: Family Medicine

## 2023-10-14 DIAGNOSIS — I10 Essential (primary) hypertension: Secondary | ICD-10-CM

## 2023-10-22 DIAGNOSIS — E1169 Type 2 diabetes mellitus with other specified complication: Secondary | ICD-10-CM | POA: Diagnosis not present

## 2023-10-24 ENCOUNTER — Other Ambulatory Visit: Payer: Self-pay | Admitting: Family Medicine

## 2023-10-24 DIAGNOSIS — E119 Type 2 diabetes mellitus without complications: Secondary | ICD-10-CM

## 2023-10-31 ENCOUNTER — Other Ambulatory Visit: Payer: Self-pay

## 2023-10-31 MED ORDER — PANTOPRAZOLE SODIUM 40 MG PO TBEC: 40.0000 mg | DELAYED_RELEASE_TABLET | Freq: Every day | ORAL | 3 refills | Status: AC

## 2023-12-03 DIAGNOSIS — Z23 Encounter for immunization: Secondary | ICD-10-CM | POA: Diagnosis not present

## 2023-12-30 ENCOUNTER — Encounter: Payer: Self-pay | Admitting: Radiology

## 2024-01-05 ENCOUNTER — Other Ambulatory Visit: Payer: Self-pay | Admitting: Family Medicine

## 2024-01-05 DIAGNOSIS — E119 Type 2 diabetes mellitus without complications: Secondary | ICD-10-CM

## 2024-01-06 NOTE — Telephone Encounter (Signed)
 Requesting:  MOUNJARO  10 MG/0.5ML Pen Last Visit: 08/19/2023 Next Visit: Visit date not found Last Refill: 10/24/2023  Please Advise

## 2024-01-07 ENCOUNTER — Other Ambulatory Visit: Payer: Self-pay | Admitting: Family Medicine

## 2024-01-07 DIAGNOSIS — E7800 Pure hypercholesterolemia, unspecified: Secondary | ICD-10-CM

## 2024-01-07 DIAGNOSIS — I10 Essential (primary) hypertension: Secondary | ICD-10-CM

## 2024-01-21 DIAGNOSIS — E1169 Type 2 diabetes mellitus with other specified complication: Secondary | ICD-10-CM | POA: Diagnosis not present

## 2024-01-21 DIAGNOSIS — Z23 Encounter for immunization: Secondary | ICD-10-CM | POA: Diagnosis not present

## 2024-01-21 DIAGNOSIS — J069 Acute upper respiratory infection, unspecified: Secondary | ICD-10-CM | POA: Diagnosis not present

## 2024-01-21 DIAGNOSIS — E119 Type 2 diabetes mellitus without complications: Secondary | ICD-10-CM | POA: Diagnosis not present

## 2024-01-21 DIAGNOSIS — I10 Essential (primary) hypertension: Secondary | ICD-10-CM | POA: Diagnosis not present

## 2024-01-27 DIAGNOSIS — J029 Acute pharyngitis, unspecified: Secondary | ICD-10-CM | POA: Diagnosis not present

## 2024-01-27 DIAGNOSIS — J0100 Acute maxillary sinusitis, unspecified: Secondary | ICD-10-CM | POA: Diagnosis not present

## 2024-01-27 DIAGNOSIS — J4531 Mild persistent asthma with (acute) exacerbation: Secondary | ICD-10-CM | POA: Diagnosis not present

## 2024-01-29 DIAGNOSIS — R9431 Abnormal electrocardiogram [ECG] [EKG]: Secondary | ICD-10-CM | POA: Diagnosis not present

## 2024-01-29 DIAGNOSIS — I700 Atherosclerosis of aorta: Secondary | ICD-10-CM | POA: Diagnosis not present

## 2024-01-29 DIAGNOSIS — R000 Tachycardia, unspecified: Secondary | ICD-10-CM | POA: Diagnosis not present

## 2024-01-29 DIAGNOSIS — R072 Precordial pain: Secondary | ICD-10-CM | POA: Diagnosis not present

## 2024-01-30 ENCOUNTER — Other Ambulatory Visit: Payer: Self-pay

## 2024-01-30 ENCOUNTER — Telehealth: Payer: Self-pay

## 2024-01-30 NOTE — Telephone Encounter (Signed)
 I received a fax from [facility] pharmacy for a refill request for Venlafaxine  XR.  There was no strength of sig for the medication.  Is it OK to send Rx for Venlafaxine  XR 75mg  one capsule daily?  Please advise.

## 2024-01-31 MED ORDER — VENLAFAXINE HCL ER 75 MG PO CP24: 75.0000 mg | ORAL_CAPSULE | Freq: Every day | ORAL | 5 refills | Status: AC

## 2024-01-31 NOTE — Telephone Encounter (Signed)
 Venlafaxine  XR 75 mg one capsule daily sent to the pharmacy.

## 2024-02-09 ENCOUNTER — Other Ambulatory Visit: Payer: Self-pay | Admitting: Family Medicine

## 2024-02-09 DIAGNOSIS — I10 Essential (primary) hypertension: Secondary | ICD-10-CM

## 2024-02-13 ENCOUNTER — Telehealth: Payer: Self-pay | Admitting: Family Medicine

## 2024-02-13 NOTE — Telephone Encounter (Signed)
 Spoke with patient to schedule AWV.  Patient stated she changed pcp to [facility]

## 2024-02-19 ENCOUNTER — Telehealth: Payer: Self-pay | Admitting: Pharmacy Technician

## 2024-02-19 ENCOUNTER — Other Ambulatory Visit (HOSPITAL_COMMUNITY): Payer: Self-pay

## 2024-02-19 NOTE — Telephone Encounter (Signed)
 Pharmacy Patient Advocate Encounter   Received notification from Onbase that prior authorization for Mounjaro  10MG /0.5ML auto-injectors  is due for renewal.   Insurance verification completed.   The patient is insured through [facility]  Action: PA required; PA submitted to above mentioned insurance via Latent Key/confirmation #/EOC [id number] Status is pending

## 2024-02-19 NOTE — Telephone Encounter (Signed)
 Pharmacy Patient Advocate Encounter  Received notification from [employer] that Prior Authorization for Mounjaro  10MG /0.5ML auto-injectors has been APPROVED from 02/19/24 to 02/25/25. Unable to obtain price due to refill too soon rejection, last fill date 01/06/24 next available fill date 03/09/24   PA #/Case ID/Reference #: [id number]

## 2024-03-10 NOTE — Progress Notes (Signed)
" °  Cardiology Office Note:  .   Date:  03/10/2024  ID:  Niels Kung, DOB Dec 30, 1955, MRN [id number] PCP: Rolinda Millman, MD  [facility] Providers Cardiologist:  Georganna Archer, MD { Chief Complaint: No chief complaint on file.   History of Present Illness: .    Alejandra Hess is a 69 y.o. female with a PMH of HTN, HLD, DM2, GERD, and asthma who presents as a new patient referral by Dr. Millman Rolinda for the evaluation of chest pain and an abnormal EKG.      Established care with me on 03/11/2024 for the evaluation of chest pain and abnormal EKG.     Pre-Chart Notes:   Discussed the use of AI scribe software for clinical note transcription with the patient, who gave verbal consent to proceed.  History of Present Illness       Studies Reviewed: SABRA    EKG: ***           Results   Risk Assessment/Calculations:    {Does this patient have ATRIAL FIBRILLATION?:[phone]} No BP recorded.  {Refresh Note OR Click here to enter BP  :1}***        Physical Exam:    VS:  There were no vitals taken for this visit. ***    Wt Readings from Last 3 Encounters:  08/19/23 151 lb (68.5 kg)  07/25/23 158 lb (71.7 kg)  06/28/23 155 lb (70.3 kg)     GEN: Well nourished, well developed, in no acute distress NECK: No JVD; No carotid bruits CARDIAC: ***RRR, no murmurs, rubs, gallops RESPIRATORY:  Clear to auscultation without rales, wheezing or rhonchi  ABDOMEN: Soft, non-tender, non-distended, normal bowel sounds EXTREMITIES:  Warm and well perfused, no edema; No deformity, 2+ radial pulses PSYCH: Normal mood and affect   ASSESSMENT AND PLAN: .    Assessment and Plan Assessment & Plan        {Are you ordering a CV Procedure (e.g. stress test, cath, DCCV, TEE, etc)?   Press F2        :[id number]}    This note was written with the assistance of a dictation microphone or AI dictation software. Please excuse any typos or grammatical errors.    Signed, Georganna Archer, MD  03/10/2024 1:41 PM    [facility] "

## 2024-03-11 ENCOUNTER — Ambulatory Visit: Attending: Cardiology | Admitting: Student in an Organized Health Care Education/Training Program

## 2024-03-11 ENCOUNTER — Encounter: Payer: Self-pay | Admitting: Student in an Organized Health Care Education/Training Program

## 2024-03-11 VITALS — BP 120/58 | HR 72 | Ht 64.0 in | Wt 145.0 lb

## 2024-03-11 DIAGNOSIS — R072 Precordial pain: Secondary | ICD-10-CM | POA: Insufficient documentation

## 2024-03-11 MED ORDER — METOPROLOL TARTRATE 100 MG PO TABS: ORAL_TABLET | ORAL | 0 refills | Status: AC

## 2024-03-11 NOTE — Patient Instructions (Addendum)
 Medication Instructions:   *If you need a refill on your cardiac medications before your next appointment, please call your pharmacy*  Lab Work: Bmp  If you have labs (blood work) drawn today and your tests are completely normal, you will receive your results only by: MyChart Message (if you have MyChart) OR A paper copy in the mail If you have any lab test that is abnormal or we need to change your treatment, we will call you to review the results.  Testing/Procedures: Echocardiogram   Coronary CTA  will be scheduled after approved by insurance   Follow instructions below     Your physician has requested that you have an echocardiogram. Echocardiography is a painless test that uses sound waves to create images of your heart. It provides your doctor with information about the size and shape of your heart and how well your hearts chambers and valves are working. This procedure takes approximately one hour. There are no restrictions for this procedure. Please do NOT wear cologne, perfume, aftershave, or lotions (deodorant is allowed). Please arrive 15 minutes prior to your appointment time.  Please note: We ask at that you not bring children with you during ultrasound (echo/ vascular) testing. Due to room size and safety concerns, children are not allowed in the ultrasound rooms during exams. Our front office staff cannot provide observation of children in our lobby area while testing is being conducted. An adult accompanying a patient to their appointment will only be allowed in the ultrasound room at the discretion of the ultrasound technician under special circumstances. We apologize for any inconvenience.   Coronary CTA  Your physician has requested that you have cardiac CT. Cardiac computed tomography (CT) is a painless test that uses an x-ray machine to take clear, detailed pictures of your heart. For further information please visit [email or web address] Please follow instruction  sheet as given.    Follow-Up: At [facility], you and your health needs are our priority.  As part of our continuing mission to provide you with exceptional heart care, our providers are all part of one team.  This team includes your primary Cardiologist (physician) and Advanced Practice Providers or APPs (Physician Assistants and Nurse Practitioners) who all work together to provide you with the care you need, when you need it.  Your next appointment:   3 month(s)  Provider:   Georganna Archer, MD         Your cardiac CT will be scheduled at one of the below locations:   [facility] [address] [place], [place] [address] [phone] (Severe contrast allergies only)  OR   [facility] [address] [place], [place] [address] [phone]  OR   [facility] [address] [place], [place] [address] [phone]  OR   [facility] and Vascular Tower [address]  [place], [place] [address]  OR   [facility] [address] [place], [place] [phone]  If scheduled at [facility], please arrive at the [facility] and Children's Entrance (Entrance C2) of [facility] 30 minutes prior to test start time. You can use the FREE valet parking offered at entrance C (encouraged to control the heart rate for the test)  Proceed to the [facility] Radiology Department (first floor) to check-in and test prep.  All radiology patients and guests should use entrance C2 at [facility], accessed from [facility], even though the hospital's physical  address listed is [address]  If scheduled at the Heart and Vascular Tower at [address], please enter the parking lot using the [address] entrance and use the FREE valet service at the patient drop-off area. Enter the building and check-in with registration on the main floor.  If scheduled at  [facility], please arrive to the Heart and Vascular Center 15 mins early for check-in and test prep.  There is spacious parking and easy access to the radiology department from the [facility] Heart and Vascular entrance. Please enter here and check-in with the desk attendant.   If scheduled at [facility], please arrive 30 minutes early for check-in and test prep.  Please follow these instructions carefully (unless otherwise directed):  An IV will be required for this test and Nitroglycerin will be given.    On the Night Before the Test: Be sure to Drink plenty of water. Do not consume any caffeinated/decaffeinated beverages or chocolate 12 hours prior to your test. Do not take any antihistamines 12 hours prior to your test.   On the Day of the Test: Drink plenty of water until 1 hour prior to the test. Do not eat any food 1 hour prior to test. You may take your regular medications prior to the test.  Take metoprolol  100 mg  two hours prior to test. Hold Coreg  morning of test  FEMALES- please wear underwire-free bra if available, avoid dresses & tight clothing      After the Test: Drink plenty of water. After receiving IV contrast, you may experience a mild flushed feeling. This is normal. On occasion, you may experience a mild rash up to 24 hours after the test. This is not dangerous. If this occurs, you can take Benadryl 25 mg, Zyrtec, Claritin, or Allegra and increase your fluid intake. (Patients taking Tikosyn should avoid Benadryl, and may take Zyrtec, Claritin, or Allegra) If you experience trouble breathing, this can be serious. If it is severe call 911 IMMEDIATELY. If it is mild, please call our office.  We will call to schedule your test 2-4 weeks out understanding that some insurance companies will need an authorization prior to the service being performed.   For more information and frequently asked questions, please visit our website :  [email or web address]  For non-scheduling related questions, please contact the cardiac imaging nurse navigator should you have any questions/concerns: Cardiac Imaging Nurse Navigators Direct Office Dial: [phone]   For scheduling needs, including cancellations and rescheduling, please call Brittany, [phone].

## 2024-03-12 ENCOUNTER — Ambulatory Visit: Payer: Self-pay | Admitting: Student in an Organized Health Care Education/Training Program

## 2024-03-12 DIAGNOSIS — I319 Disease of pericardium, unspecified: Secondary | ICD-10-CM

## 2024-03-12 LAB — BASIC METABOLIC PANEL WITH GFR
BUN/Creatinine Ratio: 21 (ref 12–28)
BUN: 15 mg/dL (ref 8–27)
CO2: 23 mmol/L (ref 20–29)
Calcium: 10.1 mg/dL (ref 8.7–10.3)
Chloride: 104 mmol/L (ref 96–106)
Creatinine, Ser: 0.7 mg/dL (ref 0.57–1.00)
Glucose: 87 mg/dL (ref 70–99)
Potassium: 4.8 mmol/L (ref 3.5–5.2)
Sodium: 142 mmol/L (ref 134–144)
eGFR: 93 mL/min/1.73

## 2024-03-13 ENCOUNTER — Ambulatory Visit

## 2024-03-13 DIAGNOSIS — R072 Precordial pain: Secondary | ICD-10-CM | POA: Diagnosis not present

## 2024-03-16 ENCOUNTER — Encounter: Payer: Self-pay | Admitting: Student in an Organized Health Care Education/Training Program

## 2024-03-18 LAB — ECHOCARDIOGRAM COMPLETE
AR max vel: 2.11 cm2
AV Area VTI: 1.99 cm2
AV Area mean vel: 1.97 cm2
AV Mean grad: 4 mmHg
AV Peak grad: 6.6 mmHg
Ao pk vel: 1.28 m/s
Area-P 1/2: 3.37 cm2
S' Lateral: 2.67 cm

## 2024-03-19 ENCOUNTER — Telehealth (HOSPITAL_COMMUNITY): Payer: Self-pay | Admitting: Emergency Medicine

## 2024-03-19 NOTE — Telephone Encounter (Signed)
 Reaching out to patient to offer assistance regarding upcoming cardiac imaging study; pt verbalizes understanding of appt date/time, parking situation and where to check in, pre-test NPO status and medications ordered, and verified current allergies; name and call back number provided for further questions should they arise Rockwell Alexandria RN Navigator Cardiac Imaging [facility] [phone] office [phone] cell

## 2024-03-20 ENCOUNTER — Ambulatory Visit (HOSPITAL_COMMUNITY)
Admission: RE | Admit: 2024-03-20 | Discharge: 2024-03-20 | Disposition: A | Source: Ambulatory Visit | Attending: Student in an Organized Health Care Education/Training Program

## 2024-03-20 DIAGNOSIS — R072 Precordial pain: Secondary | ICD-10-CM | POA: Diagnosis present

## 2024-03-20 DIAGNOSIS — R931 Abnormal findings on diagnostic imaging of heart and coronary circulation: Secondary | ICD-10-CM | POA: Insufficient documentation

## 2024-03-20 MED ORDER — NITROGLYCERIN 0.4 MG SL SUBL
0.8000 mg | SUBLINGUAL_TABLET | Freq: Once | SUBLINGUAL | Status: AC
  Administered 2024-03-20: 0.8 mg via SUBLINGUAL

## 2024-03-20 MED ORDER — IOHEXOL 350 MG/ML SOLN
100.0000 mL | Freq: Once | INTRAVENOUS | Status: AC | PRN
  Administered 2024-03-20: 100 mL via INTRAVENOUS

## 2024-03-24 ENCOUNTER — Ambulatory Visit (HOSPITAL_BASED_OUTPATIENT_CLINIC_OR_DEPARTMENT_OTHER)
Admission: RE | Admit: 2024-03-24 | Discharge: 2024-03-24 | Disposition: A | Source: Ambulatory Visit | Attending: Student in an Organized Health Care Education/Training Program

## 2024-03-24 ENCOUNTER — Other Ambulatory Visit: Payer: Self-pay | Admitting: Student in an Organized Health Care Education/Training Program

## 2024-03-24 DIAGNOSIS — R931 Abnormal findings on diagnostic imaging of heart and coronary circulation: Secondary | ICD-10-CM

## 2024-03-24 MED ORDER — ASPIRIN 81 MG PO TBEC: 81.0000 mg | DELAYED_RELEASE_TABLET | Freq: Every day | ORAL | 3 refills | Status: AC

## 2024-03-24 NOTE — Progress Notes (Signed)
 MyChart message containing providers result note and interpretation read by patient on :Last read by Niels Kung at 10:32AM on 03/24/2024.

## 2024-03-24 NOTE — Progress Notes (Signed)
 FFR ordered for coronary CTA

## 2024-03-26 ENCOUNTER — Ambulatory Visit

## 2024-03-27 ENCOUNTER — Ambulatory Visit (HOSPITAL_COMMUNITY)

## 2024-03-27 ENCOUNTER — Ambulatory Visit

## 2024-04-10 ENCOUNTER — Ambulatory Visit (HOSPITAL_COMMUNITY)

## 2024-04-14 ENCOUNTER — Ambulatory Visit (HOSPITAL_COMMUNITY)

## 2024-05-06 ENCOUNTER — Ambulatory Visit

## 2024-06-17 ENCOUNTER — Ambulatory Visit: Admitting: Student in an Organized Health Care Education/Training Program
